# Patient Record
Sex: Female | Born: 1975 | Race: White | Hispanic: No | Marital: Single | Smoking: Never smoker
Health system: Southern US, Community
[De-identification: ages and names within clinical notes are randomized; demographics above are authoritative.]

## PROBLEM LIST (undated history)

## (undated) DIAGNOSIS — Q212 Atrioventricular septal defect, unspecified as to partial or complete: Secondary | ICD-10-CM

## (undated) DIAGNOSIS — Q2123 Complete atrioventricular septal defect: Secondary | ICD-10-CM

## (undated) DIAGNOSIS — Z952 Presence of prosthetic heart valve: Secondary | ICD-10-CM

## (undated) DIAGNOSIS — Z9889 Other specified postprocedural states: Secondary | ICD-10-CM

## (undated) DIAGNOSIS — I471 Supraventricular tachycardia: Secondary | ICD-10-CM

## (undated) DIAGNOSIS — E538 Deficiency of other specified B group vitamins: Secondary | ICD-10-CM

## (undated) DIAGNOSIS — IMO0002 Reserved for concepts with insufficient information to code with codable children: Secondary | ICD-10-CM

## (undated) DIAGNOSIS — F329 Major depressive disorder, single episode, unspecified: Secondary | ICD-10-CM

## (undated) DIAGNOSIS — F32A Depression, unspecified: Secondary | ICD-10-CM

## (undated) DIAGNOSIS — B1920 Unspecified viral hepatitis C without hepatic coma: Secondary | ICD-10-CM

## (undated) DIAGNOSIS — B977 Papillomavirus as the cause of diseases classified elsewhere: Secondary | ICD-10-CM

## (undated) DIAGNOSIS — C801 Malignant (primary) neoplasm, unspecified: Secondary | ICD-10-CM

## (undated) DIAGNOSIS — I509 Heart failure, unspecified: Secondary | ICD-10-CM

## (undated) HISTORY — PX: OTHER SURGICAL HISTORY: SHX169

## (undated) HISTORY — DX: Supraventricular tachycardia: I47.1

## (undated) HISTORY — DX: Deficiency of other specified B group vitamins: E53.8

## (undated) HISTORY — DX: Atrioventricular septal defect, unspecified as to partial or complete: Q21.20

## (undated) HISTORY — DX: Reserved for concepts with insufficient information to code with codable children: IMO0002

## (undated) HISTORY — DX: Depression, unspecified: F32.A

## (undated) HISTORY — DX: Atrioventricular septal defect: Q21.2

## (undated) HISTORY — DX: Unspecified viral hepatitis C without hepatic coma: B19.20

## (undated) HISTORY — DX: Heart failure, unspecified: I50.9

## (undated) HISTORY — DX: Complete atrioventricular septal defect: Q21.23

## (undated) HISTORY — PX: WRIST SURGERY: SHX841

## (undated) HISTORY — DX: Major depressive disorder, single episode, unspecified: F32.9

## (undated) HISTORY — PX: COLPOSCOPY: SHX161

## (undated) HISTORY — PX: STERNOTOMY: SHX1057

## (undated) HISTORY — PX: WISDOM TOOTH EXTRACTION: SHX21

## (undated) HISTORY — DX: Malignant (primary) neoplasm, unspecified: C80.1

## (undated) HISTORY — DX: Papillomavirus as the cause of diseases classified elsewhere: B97.7

---
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
## 1998-01-13 ENCOUNTER — Encounter (HOSPITAL_COMMUNITY): Admission: RE | Admit: 1998-01-13 | Discharge: 1998-04-13 | Payer: Self-pay | Admitting: Cardiology

## 1998-06-02 ENCOUNTER — Encounter (HOSPITAL_COMMUNITY): Admission: RE | Admit: 1998-06-02 | Discharge: 1998-08-31 | Payer: Self-pay | Admitting: Cardiology

## 1998-07-14 ENCOUNTER — Other Ambulatory Visit: Admission: RE | Admit: 1998-07-14 | Discharge: 1998-07-14 | Payer: Self-pay | Admitting: *Deleted

## 1999-08-19 ENCOUNTER — Other Ambulatory Visit: Admission: RE | Admit: 1999-08-19 | Discharge: 1999-08-19 | Payer: Self-pay | Admitting: *Deleted

## 2000-09-24 ENCOUNTER — Other Ambulatory Visit: Admission: RE | Admit: 2000-09-24 | Discharge: 2000-09-24 | Payer: Self-pay | Admitting: *Deleted

## 2001-09-25 ENCOUNTER — Other Ambulatory Visit: Admission: RE | Admit: 2001-09-25 | Discharge: 2001-09-25 | Payer: Self-pay | Admitting: *Deleted

## 2002-09-29 ENCOUNTER — Other Ambulatory Visit: Admission: RE | Admit: 2002-09-29 | Discharge: 2002-09-29 | Payer: Self-pay | Admitting: Obstetrics and Gynecology

## 2003-09-30 ENCOUNTER — Other Ambulatory Visit: Admission: RE | Admit: 2003-09-30 | Discharge: 2003-09-30 | Payer: Self-pay | Admitting: Obstetrics and Gynecology

## 2004-06-07 ENCOUNTER — Ambulatory Visit: Payer: Self-pay | Admitting: Internal Medicine

## 2004-06-14 ENCOUNTER — Ambulatory Visit: Payer: Self-pay | Admitting: Internal Medicine

## 2004-07-24 DIAGNOSIS — B977 Papillomavirus as the cause of diseases classified elsewhere: Secondary | ICD-10-CM

## 2004-07-24 HISTORY — DX: Papillomavirus as the cause of diseases classified elsewhere: B97.7

## 2004-08-09 ENCOUNTER — Ambulatory Visit: Payer: Self-pay | Admitting: Internal Medicine

## 2004-08-16 ENCOUNTER — Ambulatory Visit: Payer: Self-pay | Admitting: Internal Medicine

## 2004-08-31 ENCOUNTER — Ambulatory Visit: Payer: Self-pay | Admitting: Internal Medicine

## 2004-10-11 ENCOUNTER — Ambulatory Visit: Payer: Self-pay | Admitting: Internal Medicine

## 2004-10-18 ENCOUNTER — Ambulatory Visit: Payer: Self-pay | Admitting: Internal Medicine

## 2004-11-22 ENCOUNTER — Ambulatory Visit: Payer: Self-pay | Admitting: Internal Medicine

## 2005-09-27 ENCOUNTER — Ambulatory Visit: Payer: Self-pay | Admitting: Internal Medicine

## 2005-10-23 ENCOUNTER — Ambulatory Visit: Payer: Self-pay | Admitting: Internal Medicine

## 2005-11-20 ENCOUNTER — Ambulatory Visit: Payer: Self-pay | Admitting: Internal Medicine

## 2006-02-21 ENCOUNTER — Encounter: Admission: RE | Admit: 2006-02-21 | Discharge: 2006-02-21 | Payer: Self-pay | Admitting: Internal Medicine

## 2006-02-21 ENCOUNTER — Ambulatory Visit: Payer: Self-pay | Admitting: Internal Medicine

## 2006-03-05 ENCOUNTER — Ambulatory Visit: Payer: Self-pay | Admitting: Internal Medicine

## 2006-03-06 ENCOUNTER — Ambulatory Visit: Payer: Self-pay | Admitting: Internal Medicine

## 2006-03-06 ENCOUNTER — Encounter: Admission: RE | Admit: 2006-03-06 | Discharge: 2006-03-06 | Payer: Self-pay | Admitting: Orthopedic Surgery

## 2006-03-07 ENCOUNTER — Encounter: Admission: RE | Admit: 2006-03-07 | Discharge: 2006-03-07 | Payer: Self-pay | Admitting: Orthopedic Surgery

## 2006-03-28 ENCOUNTER — Encounter: Admission: RE | Admit: 2006-03-28 | Discharge: 2006-03-28 | Payer: Self-pay | Admitting: Orthopedic Surgery

## 2006-03-29 ENCOUNTER — Ambulatory Visit (HOSPITAL_BASED_OUTPATIENT_CLINIC_OR_DEPARTMENT_OTHER): Admission: RE | Admit: 2006-03-29 | Discharge: 2006-03-29 | Payer: Self-pay | Admitting: Orthopedic Surgery

## 2006-04-06 ENCOUNTER — Ambulatory Visit: Payer: Self-pay | Admitting: Internal Medicine

## 2006-06-28 ENCOUNTER — Ambulatory Visit: Payer: Self-pay | Admitting: Internal Medicine

## 2006-06-28 LAB — CONVERTED CEMR LAB
ALT: 26 units/L (ref 0–40)
Albumin: 4 g/dL (ref 3.5–5.2)
Alkaline Phosphatase: 53 units/L (ref 39–117)
Basophils Absolute: 0 10*3/uL (ref 0.0–0.1)
CO2: 28 meq/L (ref 19–32)
Chol/HDL Ratio, serum: 5.4
GFR calc non Af Amer: 90 mL/min
Glomerular Filtration Rate, Af Am: 109 mL/min/{1.73_m2}
Glucose, Bld: 77 mg/dL (ref 70–99)
LDL Cholesterol: 113 mg/dL — ABNORMAL HIGH (ref 0–99)
Lymphocytes Relative: 27.9 % (ref 12.0–46.0)
Monocytes Relative: 6.7 % (ref 3.0–11.0)
Platelets: 191 10*3/uL (ref 150–400)
Potassium: 3.6 meq/L (ref 3.5–5.1)
RBC: 4.49 M/uL (ref 3.87–5.11)
RDW: 12.7 % (ref 11.5–14.6)
Sodium: 137 meq/L (ref 135–145)
TSH: 1.49 microintl units/mL (ref 0.35–5.50)
Total Bilirubin: 1.4 mg/dL — ABNORMAL HIGH (ref 0.3–1.2)
Total Protein: 6.6 g/dL (ref 6.0–8.3)
VLDL: 20 mg/dL (ref 0–40)
WBC: 4.4 10*3/uL — ABNORMAL LOW (ref 4.5–10.5)

## 2006-07-18 ENCOUNTER — Ambulatory Visit (HOSPITAL_BASED_OUTPATIENT_CLINIC_OR_DEPARTMENT_OTHER): Admission: RE | Admit: 2006-07-18 | Discharge: 2006-07-18 | Payer: Self-pay | Admitting: Orthopedic Surgery

## 2006-07-19 ENCOUNTER — Ambulatory Visit: Payer: Self-pay | Admitting: Internal Medicine

## 2006-10-16 ENCOUNTER — Ambulatory Visit: Payer: Self-pay | Admitting: Internal Medicine

## 2006-10-22 ENCOUNTER — Ambulatory Visit: Payer: Self-pay | Admitting: Internal Medicine

## 2006-11-07 ENCOUNTER — Ambulatory Visit: Payer: Self-pay | Admitting: Internal Medicine

## 2006-11-07 LAB — CONVERTED CEMR LAB
ALT: 27 units/L (ref 0–40)
AST: 22 units/L (ref 0–37)
Albumin: 3.8 g/dL (ref 3.5–5.2)
Bilirubin, Direct: 0.2 mg/dL (ref 0.0–0.3)
HDL: 29.2 mg/dL — ABNORMAL LOW (ref >39.0)
Total Bilirubin: 1 mg/dL (ref 0.3–1.2)
VLDL: 30 mg/dL (ref 0–40)

## 2006-11-13 ENCOUNTER — Ambulatory Visit: Payer: Self-pay | Admitting: Internal Medicine

## 2007-01-02 ENCOUNTER — Ambulatory Visit: Payer: Self-pay | Admitting: Internal Medicine

## 2007-02-11 ENCOUNTER — Encounter: Payer: Self-pay | Admitting: Internal Medicine

## 2007-02-11 ENCOUNTER — Ambulatory Visit: Payer: Self-pay | Admitting: Internal Medicine

## 2007-02-11 DIAGNOSIS — F411 Generalized anxiety disorder: Secondary | ICD-10-CM | POA: Insufficient documentation

## 2007-02-11 DIAGNOSIS — Z952 Presence of prosthetic heart valve: Secondary | ICD-10-CM

## 2007-02-11 DIAGNOSIS — G43909 Migraine, unspecified, not intractable, without status migrainosus: Secondary | ICD-10-CM

## 2007-02-11 DIAGNOSIS — Z9889 Other specified postprocedural states: Secondary | ICD-10-CM

## 2007-02-15 ENCOUNTER — Telehealth: Payer: Self-pay | Admitting: *Deleted

## 2007-03-06 ENCOUNTER — Encounter: Payer: Self-pay | Admitting: Internal Medicine

## 2007-05-13 ENCOUNTER — Encounter: Payer: Self-pay | Admitting: Internal Medicine

## 2007-06-24 ENCOUNTER — Telehealth: Payer: Self-pay | Admitting: Internal Medicine

## 2007-12-05 ENCOUNTER — Telehealth (INDEPENDENT_AMBULATORY_CARE_PROVIDER_SITE_OTHER): Payer: Self-pay | Admitting: *Deleted

## 2008-03-02 ENCOUNTER — Telehealth: Payer: Self-pay | Admitting: Internal Medicine

## 2008-03-03 ENCOUNTER — Ambulatory Visit: Payer: Self-pay | Admitting: Internal Medicine

## 2008-03-09 ENCOUNTER — Encounter: Payer: Self-pay | Admitting: Internal Medicine

## 2008-03-09 ENCOUNTER — Encounter: Admission: RE | Admit: 2008-03-09 | Discharge: 2008-04-03 | Payer: Self-pay | Admitting: Neurosurgery

## 2008-04-03 ENCOUNTER — Encounter: Payer: Self-pay | Admitting: Internal Medicine

## 2008-05-18 ENCOUNTER — Ambulatory Visit: Payer: Self-pay | Admitting: Internal Medicine

## 2008-05-18 LAB — CONVERTED CEMR LAB
ALT: 24 units/L (ref 0–35)
AST: 22 units/L (ref 0–37)
Basophils Absolute: 0 10*3/uL (ref 0.0–0.1)
Basophils Relative: 0.6 % (ref 0.0–3.0)
Bilirubin Urine: NEGATIVE
Bilirubin, Direct: 0.3 mg/dL (ref 0.0–0.3)
CO2: 27 meq/L (ref 19–32)
Calcium: 8.6 mg/dL (ref 8.4–10.5)
Chloride: 109 meq/L (ref 96–112)
Creatinine, Ser: 0.8 mg/dL (ref 0.4–1.2)
Eosinophils Relative: 1.8 % (ref 0.0–5.0)
Glucose, Bld: 94 mg/dL (ref 70–99)
HCT: 39 % (ref 36.0–46.0)
HDL: 28.6 mg/dL — ABNORMAL LOW (ref >39.0)
Ketones, ur: NEGATIVE mg/dL
LDL Cholesterol: 98 mg/dL (ref 0–99)
Lymphocytes Relative: 22.9 % (ref 12.0–46.0)
MCHC: 34.2 g/dL (ref 30.0–36.0)
MCV: 93.7 fL (ref 78.0–100.0)
Monocytes Relative: 4.8 % (ref 3.0–12.0)
Mucus, UA: NEGATIVE
Neutro Abs: 3.7 10*3/uL (ref 1.4–7.7)
Neutrophils Relative %: 69.9 % (ref 43.0–77.0)
TSH: 1.31 microintl units/mL (ref 0.35–5.50)
Total Bilirubin: 1.1 mg/dL (ref 0.3–1.2)
Total CHOL/HDL Ratio: 5
Total Protein: 6.3 g/dL (ref 6.0–8.3)
Triglycerides: 88 mg/dL (ref 0–149)
Urobilinogen, UA: 0.2 (ref 0.0–1.0)

## 2008-05-25 ENCOUNTER — Ambulatory Visit: Payer: Self-pay | Admitting: Internal Medicine

## 2008-06-08 ENCOUNTER — Telehealth: Payer: Self-pay | Admitting: Internal Medicine

## 2008-06-08 ENCOUNTER — Ambulatory Visit: Payer: Self-pay | Admitting: Internal Medicine

## 2008-06-08 DIAGNOSIS — E785 Hyperlipidemia, unspecified: Secondary | ICD-10-CM

## 2008-06-22 ENCOUNTER — Ambulatory Visit: Payer: Self-pay | Admitting: Internal Medicine

## 2008-06-22 ENCOUNTER — Telehealth: Payer: Self-pay | Admitting: Internal Medicine

## 2008-06-23 ENCOUNTER — Telehealth: Payer: Self-pay | Admitting: *Deleted

## 2008-06-23 ENCOUNTER — Encounter: Admission: RE | Admit: 2008-06-23 | Discharge: 2008-06-23 | Payer: Self-pay | Admitting: Internal Medicine

## 2008-06-24 ENCOUNTER — Telehealth: Payer: Self-pay | Admitting: Internal Medicine

## 2008-06-25 ENCOUNTER — Telehealth: Payer: Self-pay | Admitting: Internal Medicine

## 2008-06-25 ENCOUNTER — Ambulatory Visit: Payer: Self-pay | Admitting: Internal Medicine

## 2008-06-25 LAB — CONVERTED CEMR LAB: INR: 6.6

## 2008-06-26 ENCOUNTER — Ambulatory Visit: Payer: Self-pay | Admitting: Internal Medicine

## 2008-06-26 ENCOUNTER — Telehealth: Payer: Self-pay | Admitting: Internal Medicine

## 2008-06-26 DIAGNOSIS — R740 Nonspecific elevation of levels of transaminase and lactic acid dehydrogenase [LDH]: Secondary | ICD-10-CM

## 2008-06-26 DIAGNOSIS — Q249 Congenital malformation of heart, unspecified: Secondary | ICD-10-CM

## 2008-06-29 ENCOUNTER — Ambulatory Visit: Payer: Self-pay | Admitting: Internal Medicine

## 2008-06-29 LAB — CONVERTED CEMR LAB: Prothrombin Time: 20.7 s

## 2008-06-30 ENCOUNTER — Encounter: Payer: Self-pay | Admitting: Internal Medicine

## 2008-06-30 LAB — CONVERTED CEMR LAB
ALT: 247 units/L — ABNORMAL HIGH (ref 0–35)
AST: 187 units/L — ABNORMAL HIGH (ref 0–37)
Albumin: 3.7 g/dL (ref 3.5–5.2)
Basophils Absolute: 0 10*3/uL (ref 0.0–0.1)
Calcium: 8.9 mg/dL (ref 8.4–10.5)
Chloride: 103 meq/L (ref 96–112)
Creatinine, Ser: 0.9 mg/dL (ref 0.4–1.2)
Eosinophils Absolute: 0.1 10*3/uL (ref 0.0–0.7)
Lymphocytes Relative: 18.5 % (ref 12.0–46.0)
MCHC: 33.7 g/dL (ref 30.0–36.0)
MCV: 97.6 fL (ref 78.0–100.0)
Neutrophils Relative %: 74.9 % (ref 43.0–77.0)
Platelets: 285 10*3/uL (ref 150–400)
Potassium: 4.8 meq/L (ref 3.5–5.1)
Prothrombin Time: 41.3 s — ABNORMAL HIGH (ref 10.9–13.3)
RDW: 17.1 % — ABNORMAL HIGH (ref 11.5–14.6)
Sodium: 137 meq/L (ref 135–145)
Total Protein: 6.6 g/dL (ref 6.0–8.3)

## 2008-07-01 ENCOUNTER — Encounter: Payer: Self-pay | Admitting: Internal Medicine

## 2008-07-06 ENCOUNTER — Encounter: Payer: Self-pay | Admitting: Internal Medicine

## 2008-07-10 ENCOUNTER — Telehealth: Payer: Self-pay | Admitting: Internal Medicine

## 2008-07-13 ENCOUNTER — Ambulatory Visit: Payer: Self-pay | Admitting: Internal Medicine

## 2008-07-13 LAB — CONVERTED CEMR LAB
INR: 5.3
Prothrombin Time: 28 s

## 2008-07-28 ENCOUNTER — Ambulatory Visit: Payer: Self-pay | Admitting: Internal Medicine

## 2008-07-28 LAB — CONVERTED CEMR LAB: Prothrombin Time: 22.2 s

## 2008-09-10 ENCOUNTER — Telehealth: Payer: Self-pay | Admitting: Internal Medicine

## 2008-09-17 ENCOUNTER — Ambulatory Visit: Payer: Self-pay | Admitting: Internal Medicine

## 2008-09-17 ENCOUNTER — Telehealth: Payer: Self-pay | Admitting: Internal Medicine

## 2008-09-24 ENCOUNTER — Telehealth: Payer: Self-pay | Admitting: Internal Medicine

## 2008-10-08 ENCOUNTER — Encounter: Payer: Self-pay | Admitting: Internal Medicine

## 2008-10-21 ENCOUNTER — Encounter: Payer: Self-pay | Admitting: Internal Medicine

## 2008-10-26 ENCOUNTER — Encounter: Payer: Self-pay | Admitting: Internal Medicine

## 2008-11-10 ENCOUNTER — Telehealth (INDEPENDENT_AMBULATORY_CARE_PROVIDER_SITE_OTHER): Payer: Self-pay | Admitting: *Deleted

## 2008-11-11 ENCOUNTER — Ambulatory Visit: Payer: Self-pay | Admitting: Internal Medicine

## 2008-11-11 LAB — CONVERTED CEMR LAB
Alkaline Phosphatase: 62 units/L (ref 39–117)
Glucose, Bld: 83 mg/dL (ref 70–99)
Pro B Natriuretic peptide (BNP): 93 pg/mL (ref 0.0–100.0)
Sodium: 141 meq/L (ref 135–145)
Total Bilirubin: 2.4 mg/dL — ABNORMAL HIGH (ref 0.3–1.2)
Total Protein: 7.4 g/dL (ref 6.0–8.3)

## 2008-11-17 ENCOUNTER — Telehealth: Payer: Self-pay | Admitting: Internal Medicine

## 2008-11-23 ENCOUNTER — Ambulatory Visit: Payer: Self-pay | Admitting: Internal Medicine

## 2008-11-27 ENCOUNTER — Encounter: Payer: Self-pay | Admitting: Internal Medicine

## 2009-01-08 ENCOUNTER — Ambulatory Visit: Payer: Self-pay | Admitting: Internal Medicine

## 2009-02-19 ENCOUNTER — Ambulatory Visit: Payer: Self-pay | Admitting: Internal Medicine

## 2009-02-19 LAB — CONVERTED CEMR LAB: Prothrombin Time: 18.7 s

## 2009-04-02 ENCOUNTER — Ambulatory Visit: Payer: Self-pay | Admitting: Family Medicine

## 2009-05-10 ENCOUNTER — Encounter: Payer: Self-pay | Admitting: Internal Medicine

## 2009-06-09 ENCOUNTER — Encounter (INDEPENDENT_AMBULATORY_CARE_PROVIDER_SITE_OTHER): Payer: Self-pay | Admitting: *Deleted

## 2009-06-16 ENCOUNTER — Encounter (INDEPENDENT_AMBULATORY_CARE_PROVIDER_SITE_OTHER): Payer: Self-pay | Admitting: *Deleted

## 2009-07-14 ENCOUNTER — Encounter: Payer: Self-pay | Admitting: Internal Medicine

## 2009-09-16 ENCOUNTER — Encounter: Payer: Self-pay | Admitting: Internal Medicine

## 2009-09-21 ENCOUNTER — Encounter: Payer: Self-pay | Admitting: Internal Medicine

## 2009-09-22 ENCOUNTER — Encounter: Payer: Self-pay | Admitting: Internal Medicine

## 2009-09-28 ENCOUNTER — Ambulatory Visit: Payer: Self-pay | Admitting: Internal Medicine

## 2009-09-28 LAB — CONVERTED CEMR LAB
INR: 1.5
Prothrombin Time: 15 s

## 2009-11-16 ENCOUNTER — Ambulatory Visit: Payer: Self-pay | Admitting: Internal Medicine

## 2009-11-24 ENCOUNTER — Telehealth: Payer: Self-pay | Admitting: Internal Medicine

## 2009-11-29 ENCOUNTER — Ambulatory Visit: Payer: Self-pay | Admitting: Internal Medicine

## 2009-11-29 DIAGNOSIS — D599 Acquired hemolytic anemia, unspecified: Secondary | ICD-10-CM | POA: Insufficient documentation

## 2009-11-30 ENCOUNTER — Telehealth: Payer: Self-pay | Admitting: *Deleted

## 2009-11-30 ENCOUNTER — Encounter: Payer: Self-pay | Admitting: Internal Medicine

## 2009-12-01 ENCOUNTER — Ambulatory Visit: Payer: Self-pay | Admitting: Internal Medicine

## 2009-12-01 LAB — CONVERTED CEMR LAB
ALT: 23 units/L (ref 0–35)
Alkaline Phosphatase: 54 units/L (ref 39–117)
Basophils Absolute: 0 10*3/uL (ref 0.0–0.1)
Basophils Relative: 0.5 % (ref 0.0–3.0)
Bilirubin, Direct: 0.4 mg/dL — ABNORMAL HIGH (ref 0.0–0.3)
Calcium: 9.2 mg/dL (ref 8.4–10.5)
Chloride: 99 meq/L (ref 96–112)
Creatinine, Ser: 0.7 mg/dL (ref 0.4–1.2)
Eosinophils Absolute: 0.1 10*3/uL (ref 0.0–0.7)
Eosinophils Relative: 1.3 % (ref 0.0–5.0)
Eosinophils Relative: 1.8 % (ref 0.0–5.0)
Folate: 12 ng/mL
INR: 2.8 — ABNORMAL HIGH (ref 0.8–1.0)
Iron: 73 ug/dL (ref 42–145)
LDL Cholesterol: 118 mg/dL — ABNORMAL HIGH (ref 0–99)
Lymphocytes Relative: 16.8 % (ref 12.0–46.0)
MCHC: 32.9 g/dL (ref 30.0–36.0)
MCV: 89.7 fL (ref 78.0–100.0)
Monocytes Absolute: 0.3 10*3/uL (ref 0.1–1.0)
Monocytes Relative: 5.6 % (ref 3.0–12.0)
Neutrophils Relative %: 75.8 % (ref 43.0–77.0)
Neutrophils Relative %: 73.2 % (ref 43.0–77.0)
Nitrite: NEGATIVE
Platelets: 254 10*3/uL (ref 150.0–400.0)
RBC: 3.39 M/uL — ABNORMAL LOW (ref 3.87–5.11)
RDW: 18.1 % — ABNORMAL HIGH (ref 11.5–14.6)
Total CHOL/HDL Ratio: 5
Total Protein, Urine: 30 mg/dL
Total Protein: 6.8 g/dL (ref 6.0–8.3)
Triglycerides: 117 mg/dL (ref 0.0–149.0)
Vitamin B-12: 212 pg/mL (ref 211–911)
WBC: 4.7 10*3/uL (ref 4.5–10.5)
WBC: 5.2 10*3/uL (ref 4.5–10.5)

## 2009-12-10 ENCOUNTER — Ambulatory Visit: Payer: Self-pay | Admitting: Internal Medicine

## 2009-12-10 LAB — CONVERTED CEMR LAB: INR: 2.1

## 2009-12-13 ENCOUNTER — Ambulatory Visit: Payer: Self-pay | Admitting: Internal Medicine

## 2009-12-13 ENCOUNTER — Telehealth: Payer: Self-pay | Admitting: Internal Medicine

## 2009-12-13 LAB — CONVERTED CEMR LAB
Alkaline Phosphatase: 43 units/L (ref 39–117)
Basophils Absolute: 0 10*3/uL (ref 0.0–0.1)
Basophils Relative: 0.3 % (ref 0.0–3.0)
Bilirubin, Direct: 0.3 mg/dL (ref 0.0–0.3)
Calcium: 8.7 mg/dL (ref 8.4–10.5)
Creatinine, Ser: 0.7 mg/dL (ref 0.4–1.2)
Eosinophils Absolute: 0.1 10*3/uL (ref 0.0–0.7)
GFR calc non Af Amer: 100.89 mL/min (ref >60)
HCT: 30 % — ABNORMAL LOW (ref 36.0–46.0)
Hemoglobin: 9.8 g/dL — ABNORMAL LOW (ref 12.0–15.0)
Lymphs Abs: 0.8 10*3/uL (ref 0.7–4.0)
MCHC: 32.7 g/dL (ref 30.0–36.0)
Monocytes Relative: 7 % (ref 3.0–12.0)
Neutro Abs: 3.1 10*3/uL (ref 1.4–7.7)
RBC: 3.43 M/uL — ABNORMAL LOW (ref 3.87–5.11)
RDW: 17.6 % — ABNORMAL HIGH (ref 11.5–14.6)
Sodium: 139 meq/L (ref 135–145)
Total Bilirubin: 1.1 mg/dL (ref 0.3–1.2)

## 2009-12-14 ENCOUNTER — Telehealth: Payer: Self-pay | Admitting: Internal Medicine

## 2009-12-14 ENCOUNTER — Encounter: Payer: Self-pay | Admitting: Internal Medicine

## 2009-12-15 ENCOUNTER — Telehealth: Payer: Self-pay | Admitting: Internal Medicine

## 2010-01-19 ENCOUNTER — Encounter: Payer: Self-pay | Admitting: Internal Medicine

## 2010-01-19 ENCOUNTER — Ambulatory Visit: Payer: Self-pay | Admitting: Internal Medicine

## 2010-01-19 ENCOUNTER — Telehealth: Payer: Self-pay | Admitting: Internal Medicine

## 2010-01-19 LAB — CONVERTED CEMR LAB
ALT: 28 units/L (ref 0–35)
AST: 41 units/L — ABNORMAL HIGH (ref 0–37)
Alkaline Phosphatase: 54 units/L (ref 39–117)
Basophils Absolute: 0 10*3/uL (ref 0.0–0.1)
Eosinophils Absolute: 0 10*3/uL (ref 0.0–0.7)
Eosinophils Relative: 1.1 % (ref 0.0–5.0)
HCT: 29.2 % — ABNORMAL LOW (ref 36.0–46.0)
INR: 2.4
LDH: 633 units/L — ABNORMAL HIGH (ref 94–250)
Lymphs Abs: 0.7 10*3/uL (ref 0.7–4.0)
MCV: 89.1 fL (ref 78.0–100.0)
Monocytes Absolute: 0.3 10*3/uL (ref 0.1–1.0)
Neutrophils Relative %: 74.1 % (ref 43.0–77.0)
Platelets: 247 10*3/uL (ref 150.0–400.0)
RDW: 18.7 % — ABNORMAL HIGH (ref 11.5–14.6)
Total Bilirubin: 0.8 mg/dL (ref 0.3–1.2)
WBC: 4.1 10*3/uL — ABNORMAL LOW (ref 4.5–10.5)

## 2010-01-31 ENCOUNTER — Telehealth: Payer: Self-pay | Admitting: *Deleted

## 2010-02-09 ENCOUNTER — Telehealth: Payer: Self-pay | Admitting: Internal Medicine

## 2010-02-16 ENCOUNTER — Telehealth: Payer: Self-pay | Admitting: Internal Medicine

## 2010-03-04 ENCOUNTER — Ambulatory Visit: Payer: Self-pay | Admitting: Internal Medicine

## 2010-03-07 ENCOUNTER — Ambulatory Visit: Payer: Self-pay | Admitting: Internal Medicine

## 2010-03-07 ENCOUNTER — Telehealth: Payer: Self-pay | Admitting: Internal Medicine

## 2010-03-07 LAB — CONVERTED CEMR LAB
Alkaline Phosphatase: 51 units/L (ref 39–117)
BUN: 14 mg/dL (ref 6–23)
Basophils Absolute: 0 10*3/uL (ref 0.0–0.1)
Bilirubin, Direct: 0.3 mg/dL (ref 0.0–0.3)
CO2: 30 meq/L (ref 19–32)
Chloride: 100 meq/L (ref 96–112)
Creatinine, Ser: 0.7 mg/dL (ref 0.4–1.2)
Eosinophils Absolute: 0.1 10*3/uL (ref 0.0–0.7)
GFR calc non Af Amer: 105.89 mL/min (ref >60)
INR: 3.7
Iron: 85 ug/dL (ref 42–145)
Lymphocytes Relative: 20.2 % (ref 12.0–46.0)
MCHC: 33.5 g/dL (ref 30.0–36.0)
Monocytes Relative: 5.9 % (ref 3.0–12.0)
Neutro Abs: 2.9 10*3/uL (ref 1.4–7.7)
Neutrophils Relative %: 71.5 % (ref 43.0–77.0)
Platelets: 171 10*3/uL (ref 150.0–400.0)
Potassium: 3.9 meq/L (ref 3.5–5.1)
RDW: 22.6 % — ABNORMAL HIGH (ref 11.5–14.6)
Total Bilirubin: 1.4 mg/dL — ABNORMAL HIGH (ref 0.3–1.2)
Total Protein: 6.3 g/dL (ref 6.0–8.3)

## 2010-03-09 ENCOUNTER — Ambulatory Visit: Payer: Self-pay | Admitting: Internal Medicine

## 2010-03-09 DIAGNOSIS — E538 Deficiency of other specified B group vitamins: Secondary | ICD-10-CM

## 2010-03-09 HISTORY — DX: Deficiency of other specified B group vitamins: E53.8

## 2010-03-22 ENCOUNTER — Ambulatory Visit: Payer: Self-pay | Admitting: Cardiovascular Disease

## 2010-03-22 ENCOUNTER — Telehealth: Payer: Self-pay | Admitting: Internal Medicine

## 2010-03-24 ENCOUNTER — Telehealth: Payer: Self-pay | Admitting: Internal Medicine

## 2010-03-29 ENCOUNTER — Encounter: Payer: Self-pay | Admitting: Internal Medicine

## 2010-04-01 ENCOUNTER — Telehealth: Payer: Self-pay | Admitting: Internal Medicine

## 2010-04-04 ENCOUNTER — Encounter: Payer: Self-pay | Admitting: Internal Medicine

## 2010-04-04 ENCOUNTER — Telehealth (INDEPENDENT_AMBULATORY_CARE_PROVIDER_SITE_OTHER): Payer: Self-pay | Admitting: *Deleted

## 2010-04-06 ENCOUNTER — Ambulatory Visit: Payer: Self-pay | Admitting: Internal Medicine

## 2010-04-18 ENCOUNTER — Ambulatory Visit: Payer: Self-pay | Admitting: Internal Medicine

## 2010-04-19 LAB — CONVERTED CEMR LAB
ALT: 23 units/L (ref 0–35)
AST: 43 units/L — ABNORMAL HIGH (ref 0–37)
Alkaline Phosphatase: 44 units/L (ref 39–117)
Basophils Relative: 0.5 % (ref 0.0–3.0)
Bilirubin, Direct: 0.4 mg/dL — ABNORMAL HIGH (ref 0.0–0.3)
CO2: 34 meq/L — ABNORMAL HIGH (ref 19–32)
Chloride: 98 meq/L (ref 96–112)
Eosinophils Relative: 1.2 % (ref 0.0–5.0)
Glucose, Bld: 81 mg/dL (ref 70–99)
Hemoglobin: 13.2 g/dL (ref 12.0–15.0)
INR: 2.2 — ABNORMAL HIGH (ref 0.8–1.0)
Lymphocytes Relative: 20.9 % (ref 12.0–46.0)
MCV: 94.4 fL (ref 78.0–100.0)
Neutro Abs: 4.5 10*3/uL (ref 1.4–7.7)
Neutrophils Relative %: 73 % (ref 43.0–77.0)
RBC: 4.09 M/uL (ref 3.87–5.11)
Sodium: 139 meq/L (ref 135–145)
Total Protein: 7.1 g/dL (ref 6.0–8.3)
WBC: 6.1 10*3/uL (ref 4.5–10.5)

## 2010-04-22 ENCOUNTER — Encounter: Payer: Self-pay | Admitting: Internal Medicine

## 2010-04-25 ENCOUNTER — Telehealth: Payer: Self-pay | Admitting: Internal Medicine

## 2010-04-26 ENCOUNTER — Encounter: Payer: Self-pay | Admitting: Internal Medicine

## 2010-04-26 ENCOUNTER — Ambulatory Visit: Payer: Self-pay

## 2010-04-26 ENCOUNTER — Ambulatory Visit: Payer: Self-pay | Admitting: Cardiology

## 2010-04-26 ENCOUNTER — Ambulatory Visit (HOSPITAL_COMMUNITY): Admission: RE | Admit: 2010-04-26 | Discharge: 2010-04-26 | Payer: Self-pay | Admitting: Internal Medicine

## 2010-04-27 ENCOUNTER — Telehealth: Payer: Self-pay | Admitting: Internal Medicine

## 2010-05-02 ENCOUNTER — Encounter: Payer: Self-pay | Admitting: Internal Medicine

## 2010-05-02 ENCOUNTER — Telehealth: Payer: Self-pay | Admitting: Internal Medicine

## 2010-05-04 ENCOUNTER — Ambulatory Visit: Payer: Self-pay | Admitting: Internal Medicine

## 2010-05-04 LAB — CONVERTED CEMR LAB
ALT: 28 units/L (ref 0–35)
AST: 39 units/L — ABNORMAL HIGH (ref 0–37)
Alkaline Phosphatase: 53 units/L (ref 39–117)
Bilirubin, Direct: 0.3 mg/dL (ref 0.0–0.3)
Total Protein: 7.2 g/dL (ref 6.0–8.3)

## 2010-05-16 ENCOUNTER — Encounter: Payer: Self-pay | Admitting: Internal Medicine

## 2010-05-27 ENCOUNTER — Telehealth (INDEPENDENT_AMBULATORY_CARE_PROVIDER_SITE_OTHER): Payer: Self-pay | Admitting: *Deleted

## 2010-05-27 ENCOUNTER — Ambulatory Visit: Payer: Self-pay | Admitting: Internal Medicine

## 2010-05-30 LAB — CONVERTED CEMR LAB
BUN: 16 mg/dL (ref 6–23)
Calcium: 9.5 mg/dL (ref 8.4–10.5)
Chloride: 97 meq/L (ref 96–112)
Creatinine, Ser: 0.7 mg/dL (ref 0.4–1.2)
GFR calc non Af Amer: 109.45 mL/min (ref >60)
Pro B Natriuretic peptide (BNP): 153 pg/mL — ABNORMAL HIGH (ref 0.0–100.0)

## 2010-07-06 ENCOUNTER — Ambulatory Visit: Payer: Self-pay | Admitting: Internal Medicine

## 2010-07-11 ENCOUNTER — Ambulatory Visit: Payer: Self-pay | Admitting: Internal Medicine

## 2010-07-11 LAB — CONVERTED CEMR LAB: INR: 4.3

## 2010-07-19 ENCOUNTER — Ambulatory Visit
Admission: RE | Admit: 2010-07-19 | Discharge: 2010-07-19 | Payer: Self-pay | Source: Home / Self Care | Attending: Internal Medicine | Admitting: Internal Medicine

## 2010-07-19 LAB — CONVERTED CEMR LAB: INR: 1.5

## 2010-07-20 ENCOUNTER — Ambulatory Visit: Payer: Self-pay | Admitting: Internal Medicine

## 2010-07-27 ENCOUNTER — Ambulatory Visit
Admission: RE | Admit: 2010-07-27 | Discharge: 2010-07-27 | Payer: Self-pay | Source: Home / Self Care | Attending: Internal Medicine | Admitting: Internal Medicine

## 2010-07-27 LAB — CONVERTED CEMR LAB: INR: 2.1

## 2010-08-01 ENCOUNTER — Ambulatory Visit
Admission: RE | Admit: 2010-08-01 | Discharge: 2010-08-01 | Payer: Self-pay | Source: Home / Self Care | Attending: Internal Medicine | Admitting: Internal Medicine

## 2010-08-01 LAB — CONVERTED CEMR LAB: INR: 2.3

## 2010-08-05 ENCOUNTER — Telehealth: Payer: Self-pay | Admitting: Internal Medicine

## 2010-08-11 ENCOUNTER — Encounter: Payer: Self-pay | Admitting: Internal Medicine

## 2010-08-11 ENCOUNTER — Ambulatory Visit
Admission: RE | Admit: 2010-08-11 | Discharge: 2010-08-11 | Payer: Self-pay | Source: Home / Self Care | Attending: Internal Medicine | Admitting: Internal Medicine

## 2010-08-11 ENCOUNTER — Other Ambulatory Visit: Payer: Self-pay | Admitting: Internal Medicine

## 2010-08-11 LAB — BRAIN NATRIURETIC PEPTIDE: Pro B Natriuretic peptide (BNP): 109.8 pg/mL — ABNORMAL HIGH (ref 0.0–100.0)

## 2010-08-12 ENCOUNTER — Telehealth: Payer: Self-pay | Admitting: Internal Medicine

## 2010-08-14 ENCOUNTER — Encounter: Payer: Self-pay | Admitting: Orthopedic Surgery

## 2010-08-14 ENCOUNTER — Encounter: Payer: Self-pay | Admitting: Internal Medicine

## 2010-08-21 LAB — CONVERTED CEMR LAB
HCV Ab: REACTIVE — AB
Hep A IgM: NEGATIVE
Hepatitis B Surface Ag: NEGATIVE

## 2010-08-23 NOTE — Progress Notes (Signed)
Summary: labs.  Phone Note Call from Patient   Caller: Patient Call For: Stacie Glaze MD Reason for Call: Talk to Doctor Summary of Call: Pt is requesting to have labs drawn at the hospital before her office visit today. Per Dr. Lovell Sheehan: LFT CBC BMET Stool Studies. Initial call taken by: Lynann Beaver CMA,  Dec 13, 2009 9:32 AM  Follow-up for Phone Call        labs in computer- Follow-up by: Willy Eddy, LPN,  Dec 13, 2009 9:39 AM

## 2010-08-23 NOTE — Assessment & Plan Note (Signed)
Summary: 2 MTH ROV  (PT TO LAB FOR PT) // RS---PT [facility] // RS   Vital Signs:  Patient profile:   35 year old female Height:      64 inches Weight:      135.8 pounds BMI:     23.39 Temp:     98.2 degrees F oral Pulse rate:   56 / minute Resp:     14 per minute BP sitting:   110 / 74  (left arm) BP standing:   110 / 74  (left arm)  Vitals Entered By: Willy Eddy, LPN (May 04, 2010 10:13 AM) CC: ROA- BISOPROLOL INCREASED TO 7.5 QD, Lipid Management Is Patient Diabetic? No   Primary Care Provider:  Darryll Capers, MD  CC:  ROA- BISOPROLOL INCREASED TO 7.5 QD and Lipid Management.  History of Present Illness: The pt presents for follow up of the bisoprolol monitering of LDH and liver functions generally feels well and her episodic weakness may be due to PAT?   Lipid Management History:      Positive NCEP/ATP III risk factors include HDL cholesterol less than 40.  Negative NCEP/ATP III risk factors include female age less than 89 years old, non-tobacco-user status, non-hypertensive, no ASHD (atherosclerotic heart disease), no prior stroke/TIA, no peripheral vascular disease, and no history of aortic aneurysm.     Preventive Screening-Counseling & Management  Alcohol-Tobacco     Smoking Status: never     Tobacco Counseling: not indicated; no tobacco use  Problems Prior to Update: 1)  Svt/ Psvt/ Pat  (ICD-427.0) 2)  B12 Deficiency  (ICD-266.2) 3)  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) 4)  Acute Cystitis  (ICD-595.0) 5)  Acquired Hemolytic Anemia Unspecified  (ICD-283.9) 6)  Alopecia  (ICD-704.00) 7)  Headache, Chronic  (ICD-784.0) 8)  Other Acute Sinusitis  (ICD-461.8) 9)  Encounter For Therapeutic Drug Monitoring  (ICD-V58.83) 10)  Encounter For Therapeutic Drug Monitoring  (ICD-V58.83) 11)  Congenital Heart Disease, Hx of  (ICD-V12.59) 12)  Diarrhea  (ICD-787.91) 13)  Dyspnea On Exertion  (ICD-786.09) 14)  Abnormal Transaminase-lft's  (ICD-790.4) 15)   Other Ascites  (ICD-789.59) 16)  Nonspecific Abn Findng Rad&oth Exam Bilary Trct  (ICD-793.3) 17)  Encounter For Therapeutic Drug Monitoring  (ICD-V58.83) 18)  Abdominal Pain, Generalized  (ICD-789.07) 19)  Hyperlipidemia, Borderline, 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) 20)  Physical Examination  (ICD-V70.0) 21)  Back Pain, Lumbar, With Radiculopathy  (ICD-724.4) 22)  Anxiety State Nos  (ICD-300.00) 23)  Migraine Nos W/intractable Migraine  (ICD-346.91) 24)  Coumadin Therapy  (ICD-V58.61) 25)  Mitral Valve Replacement, Hx of  (ICD-V15.1) 26)  Aortic Valve Replacement, Hx of  (ICD-V43.3)  Current Problems (verified): 1)  Svt/ Psvt/ Pat  (ICD-427.0) 2)  B12 Deficiency  (ICD-266.2) 3)  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) 4)  Acute Cystitis  (ICD-595.0) 5)  Acquired Hemolytic Anemia Unspecified  (ICD-283.9) 6)  Alopecia  (ICD-704.00) 7)  Headache, Chronic  (ICD-784.0) 8)  Other Acute Sinusitis  (ICD-461.8) 9)  Encounter For Therapeutic Drug Monitoring  (ICD-V58.83) 10)  Encounter For Therapeutic Drug Monitoring  (ICD-V58.83) 11)  Congenital Heart Disease, Hx of  (ICD-V12.59) 12)  Diarrhea  (ICD-787.91) 13)  Dyspnea On Exertion  (ICD-786.09) 14)  Abnormal Transaminase-lft's  (ICD-790.4) 15)  Other Ascites  (ICD-789.59) 16)  Nonspecific Abn Findng Rad&oth Exam Bilary Trct  (ICD-793.3) 17)  Encounter For Therapeutic Drug Monitoring  (ICD-V58.83) 18)  Abdominal Pain, Generalized  (ICD-789.07) 19)  Hyperlipidemia, Borderline, 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) 20)  Physical Examination  (ICD-V70.0) 21)  Back Pain, Lumbar, With Radiculopathy  (ICD-724.4) 22)  Anxiety State Nos  (ICD-300.00) 23)  Migraine Nos W/intractable Migraine  (ICD-346.91) 24)  Coumadin Therapy  (ICD-V58.61) 25)  Mitral Valve Replacement, Hx of  (ICD-V15.1) 26)  Aortic Valve Replacement, Hx of  (ICD-V43.3)  Medications Prior to Update: 1)  Coumadin 4 Mg Tabs (Warfarin Sodium) .... Take 4 Mg By Mouth As Directed 2)   Maxalt-Mlt 10 Mg Tbdp (Rizatriptan Benzoate) .... Take 1 Tablet By Mouth 3)  Ortho-Novum 1/35 (28) 1-35 Mg-Mcg Tabs (Norethindrone-Eth Estradiol) .... Once Daily 4)  Spironolactone 50 Mg Tabs (Spironolactone) .Marland Kitchen.. 1 Time Every Other Day 5)  Cymbalta 30 Mg Cpep (Duloxetine Hcl) .Marland Kitchen.. 1 Once Daily 6)  Hydrochlorothiazide 50 Mg Tabs (Hydrochlorothiazide) .Marland Kitchen.. 1 Every Other Day 7)  Furosemide 40 Mg Tabs (Furosemide) .Marland Kitchen.. 1 Once Daily 8)  Bisoprolol Fumarate 5 Mg Tabs (Bisoprolol Fumarate) .... One By Mouth Daily 9)  Ferrous Sulfate 325 (65 Fe) Mg Tabs (Ferrous Sulfate) .... (Otc) 1 Tab Once Daily 10)  Multi Vitamin .... Take One Tablet By Mouth Daily  Current Medications (verified): 1)  Coumadin 4 Mg Tabs (Warfarin Sodium) .... Take 4 Mg By Mouth As Directed 2)  Maxalt-Mlt 10 Mg Tbdp (Rizatriptan Benzoate) .... Take 1 Tablet By Mouth 3)  Ortho-Novum 1/35 (28) 1-35 Mg-Mcg Tabs (Norethindrone-Eth Estradiol) .... Once Daily 4)  Spironolactone 50 Mg Tabs (Spironolactone) .Marland Kitchen.. 1 Time Every Other Day 5)  Cymbalta 30 Mg Cpep (Duloxetine Hcl) .Marland Kitchen.. 1 Once Daily 6)  Hydrochlorothiazide 50 Mg Tabs (Hydrochlorothiazide) .Marland Kitchen.. 1 Every Other Day 7)  Furosemide 40 Mg Tabs (Furosemide) .Marland Kitchen.. 1 Once Daily 8)  Bisoprolol Fumarate 5 Mg Tabs (Bisoprolol Fumarate) .... 1.5 Once Daily 9)  Ferrous Sulfate 325 (65 Fe) Mg Tabs (Ferrous Sulfate) .... (Otc) 1 Tab Once Daily 10)  Multi Vitamin .... Take One Tablet By Mouth Daily  Allergies (verified): No Known Drug Allergies  Past History:  Family History: Last updated: 06/26/2008 Family History of Colon Cancer: Paternal Grandmother Family History of Colon Polyps: Father  Social History: Last updated: 06/26/2008 Single Never Smoked Alcohol Use - yes - occasional Illicit Drug Use - no Patient gets regular exercise. Occupation: Engineer, civil (consulting), [employer]  Risk Factors: Exercise: yes (06/26/2008)  Risk Factors: Smoking Status: never  (05/04/2010)  Past medical, surgical, family and social histories (including risk factors) reviewed, and no changes noted (except as noted below).  Past Medical History: Reviewed history from 03/04/2010 and no changes required. 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 Family History of Colon Cancer: Paternal Grandmother Family History of Colon Polyps: Father  Social History: Reviewed history from 06/26/2008 and no changes required. Single Never Smoked Alcohol Use - yes - occasional Illicit Drug Use - no Patient gets regular exercise. Occupation: Engineer, civil (consulting), [employer]  Review of Systems  The patient denies anorexia, fever, weight loss, weight gain, vision loss, decreased hearing, hoarseness, chest pain, syncope, dyspnea on exertion, peripheral edema, prolonged cough, headaches, hemoptysis, abdominal pain, melena, hematochezia, severe indigestion/heartburn, hematuria, incontinence, genital sores, muscle weakness, suspicious skin lesions, transient blindness, difficulty  walking, depression, unusual weight change, abnormal bleeding, enlarged lymph nodes, angioedema, and breast masses.    Physical Exam  General:  much improved color well-developed, well-nourished, and well-hydrated.   Head:  normocephalic and atraumatic.   Eyes:  pupils equal and pupils round.   Ears:  R ear normal and L ear normal.   Nose:  no external deformity and no nasal discharge.   Mouth:  good dentition.    Lungs:  normal respiratory effort and no wheezes.   Heart:  aortic and mitral mechanical valve sounds no evidecne of value leakage murmurs ( no thrills) Abdomen:  soft and normal bowel sounds.   Msk:  No deformity or scoliosis noted of thoracic or lumbar spine.   Extremities:  No clubbing, cyanosis, edema, or deformity noted with normal full range of motion of all joints.   Neurologic:  alert & oriented X3, gait normal, and DTRs symmetrical and normal.     Impression & Recommendations:  Problem # 1:  SVT/ PSVT/ PAT (ICD-427.0)  the telemetry report showes some episodic af She is no orthostatic and is in SR today the bisoprolol was increased   Her updated medication list for this problem includes:    Coumadin 4 Mg Tabs (Warfarin sodium) .Marland Kitchen... Take 4 mg by mouth as directed    Bisoprolol Fumarate 5 Mg Tabs (Bisoprolol fumarate) .Marland Kitchen... 1.5 once daily  Labs Reviewed: Na: 139 (04/18/2010)   K+: 3.8 (04/18/2010)   CL: 98 (04/18/2010)   HCO3: 34 (04/18/2010) Ca: 9.2 (04/18/2010)   TSH: 1.80 (11/16/2009)   HCO3: 34 (04/18/2010)  Problem # 2:  ABNORMAL TRANSAMINASE-LFT'S (ICD-790.4)  monitering  Orders: Fingerstick ([address]) TLB-Hepatic/Liver Function Pnl (80076-HEPATIC)  Problem # 3:  ACQUIRED HEMOLYTIC ANEMIA UNSPECIFIED (ICD-283.9) Assessment: Unchanged  due to valular dz Her updated medication list for this problem includes:    Ferrous Sulfate 325 (65 Fe) Mg Tabs (Ferrous sulfate) ..... (otc) 1 tab once daily  Hgb: 13.2 (04/18/2010)   Hct: 38.6 (04/18/2010)   Platelets: 217.0 (04/18/2010) RBC: 4.09 (04/18/2010)   RDW: 16.7 (04/18/2010)   WBC: 6.1 (04/18/2010) MCV: 94.4 (04/18/2010)   MCHC: 34.2 (04/18/2010) Iron: 85 (03/07/2010)   % Sat: 12.4 (11/29/2009) B12: 353 (03/07/2010)   Folate: 12.0 (11/29/2009)   TSH: 1.80 (11/16/2009)  Orders: T-LDH Isoenzyme ([id number])  Problem # 4:  COUMADIN THERAPY (ICD-V58.61) protime was 2 weeks ago 2.2  ( 2.5 to 3.5)  has an  appointment in 2 weeks  Complete Medication List: 1)  Coumadin 4 Mg Tabs (Warfarin sodium) .... Take 4 mg by mouth as directed 2)  Maxalt-mlt 10 Mg Tbdp (Rizatriptan benzoate) .... Take 1 tablet by mouth 3)  Ortho-novum 1/35 (28) 1-35 Mg-mcg Tabs (Norethindrone-eth estradiol) .... Once daily 4)  Spironolactone 50 Mg Tabs (Spironolactone) .Marland Kitchen.. 1 time every other day 5)  Cymbalta 30 Mg Cpep (Duloxetine hcl) .Marland Kitchen.. 1 once daily 6)  Hydrochlorothiazide 50 Mg Tabs (Hydrochlorothiazide) .Marland Kitchen.. 1 every other day 7)  Furosemide 40 Mg Tabs (Furosemide) .Marland Kitchen.. 1 once daily 8)  Bisoprolol Fumarate 5 Mg Tabs (Bisoprolol fumarate) .... 1.5 once daily 9)  Ferrous Sulfate 325 (65 Fe) Mg Tabs (Ferrous sulfate) .... (otc) 1 tab once daily 10)  Multi Vitamin  .... Take one tablet by mouth daily  Lipid Assessment/Plan:      Based on NCEP/ATP III, the patient's risk factor category is "0-1 risk factors".  The patient's lipid goals are as follows: Total cholesterol goal is 200; LDL cholesterol goal is 160; HDL cholesterol goal  is 40; Triglyceride goal is 150.    Patient Instructions: 1)  Please schedule a follow-up appointment in 3 months.  Appended Document: Orders Update    Clinical Lists Changes  Orders: Added new Service order of Venipuncture ([address]) - Signed Added new Service order of Specimen Handling (60454) - Signed

## 2010-08-23 NOTE — Letter (Signed)
Summary: Generic Letter  Architectural technologist, Main Office  [address]   [place], [place] [address]   Phone: [phone]  Fax: [phone]                   05/02/2010       Wynn Maudlin, M.D. [facility] [place], [place] [address] [place], [place] [address]  Dear Renae Fickle,    Enclosed are strips of event recordings from the monitor that Katryna has worn over the past few weeks.  There are several short bursts of SVT, suspicious for aflutter except that they are so short in duration.  Yanira has continued to complain of fatigue, feeling her heart race.  I have asked her to increase her bisoprolol from 5 mg per day to 5 mg AM / 2.5 mg PM.    Timera also had an echo done in our office last week.  A CD is enclosed for your review.  Her PAP was increased from that reported previously.  Otherwise, there did not appear to be significant change from the previous report.  OF note, during the study Kenady did have several short bursts of tachycardia.  She did not, however, sense these as they were occurring.      I will keep up with the monitor and send you more strips if there are any more with significant arrhythmia.  You are due to see Cintya at the end of this month.                Sincerely,

## 2010-08-23 NOTE — Letter (Signed)
Summary: Cardiac Re-Evaluation/[facility]  Cardiac Re-Evaluation/[facility]   Imported By: Maryln Gottron 05/19/2010 14:53:54  _____________________________________________________________________  External Attachment:    Type:   Image     Comment:   External Document

## 2010-08-23 NOTE — Letter (Signed)
Summary: [facility]  [facility]   Imported By: Maryln Gottron 10/01/2009 14:05:29  _____________________________________________________________________  External Attachment:    Type:   Image     Comment:   External Document

## 2010-08-23 NOTE — Progress Notes (Signed)
  Walk in Patient Form Recieved " note left for Nurse" sent to [facility]  May 27, 2010 1:19 PM

## 2010-08-23 NOTE — Assessment & Plan Note (Signed)
Summary: increased fatigue/sob   Visit Type:  Follow-up Primary Jessica [name]:  Jessica Capers, MD  CC:  sob and fatigue.  History of Present Illness: She has a history of AV canal defect, s/p repair early in life.  Review of her records shows she underwent MV replacement initially with heterograft and then 23 mm St Jude mechanical valve.   In May 1991 she underwent modified Konno operation with a 25 mm St Jude valve in the aortic position. In 1995 she had a 23 mm Carbomedics valve placed in the mitral position for a partially obstructed valve.  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  All surgeries were done at [facility]. 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  There was trivial AI.  LV and RV function were normal  RV mildly dilated  Atria were mildly dilated..  No fistula was seen.  Pulmonary pressures were mildly increased.  RV pressures normal. Patient has had problems with tachypalpitations.  She was placed on a b blocker (bisoprolol) for these. When  I saw her last she complained of noticing some increassed heart rates esp when whe was climbing stairs.  Otherwise she says she was doing ok.  Holter monitor was done that showed no signif arrhtyhmia.  she was also set up and has begun using an event monitor. SInce her last clinic visit she has had more episodes of increased heart rates.  She is using the monitor now.  She also says she is feeling more fatigued.  She finds herself at work feeling tired which is new for her.  She is no longer working out.  Current Medications (verified): 1)  Coumadin 4 Mg Tabs (Warfarin Sodium) .... Take 4 Mg By Mouth As Directed 2)  Maxalt-Mlt 10 Mg Tbdp (Rizatriptan Benzoate) .... Take 1 Tablet By Mouth 3)  Ortho-Novum 1/35 (28) 1-35 Mg-Mcg Tabs (Norethindrone-Eth Estradiol) .... Once Daily 4)  Spironolactone 50 Mg Tabs (Spironolactone) .Marland Kitchen.. 1 Time Every Other  Day 5)  Cymbalta 30 Mg Cpep (Duloxetine Hcl) .Marland Kitchen.. 1 Once Daily 6)  Hydrochlorothiazide 50 Mg Tabs (Hydrochlorothiazide) .Marland Kitchen.. 1 Every Other Day 7)  Furosemide 40 Mg Tabs (Furosemide) .Marland Kitchen.. 1 Once Daily 8)  Bisoprolol Fumarate 5 Mg Tabs (Bisoprolol Fumarate) .... One By Mouth Daily 9)  Ferrous Sulfate 325 (65 Fe) Mg Tabs (Ferrous Sulfate) .... (Otc) 1 Tab Once Daily 10)  Multi Vitamin .... Take One Tablet By Mouth Daily  Allergies (verified): No Known Drug Allergies  Past History:  Past medical, surgical, family and social histories (including risk factors) reviewed, and no changes noted (except as noted below).  Past Medical History: Reviewed history from 03/04/2010 and no changes required. 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 Family History of Colon Cancer: Paternal Grandmother Family History of Colon Polyps: Father  Social History: Reviewed history from 06/26/2008 and no changes required. Single Never Smoked Alcohol Use - yes - occasional Illicit Drug Use - no Patient gets regular exercise. Occupation: Engineer, civil (consulting), Psychologist, clinical  Vital Signs:  Patient profile:   35 year old female Height:      64 inches Weight:      135 pounds BMI:     23.26 Pulse rate:   62 / minute BP sitting:   102 / 72  (left arm) Cuff size:   regular  Vitals Entered By: Burnett Kanaris, CNA (April 18, 2010 9:03 AM)  Physical Exam  Additional Exam:  General:  Well developed, well nourished, in  no acute distress. Head:  normocephalic and atraumatic Eyes:  PERRLA/EOM intact; conjunctiva and lids normal. Mouth:  Mucous membranes moist Neck:  JVP is normal  NO thyromegaly. Lungs:  CTA.  NO rales or wheezes. Heart:  RRR.  S!, S2.  Crisp valve sounds.  Gr II/VI systolic murmur LLSB.  Gr 2-3/VI diastolic murmur LUSB.  No S3.  PMI not displaced. Abdomen:  Supple  No hepatomegaly.  Normal bowel sounds.   Pulses:  2+ pulses, equal onset throughout. Extremities:  No lower extremity edema.       Impression & Recommendations:  Problem # 1:  CONGENITAL HEART DISEASE, HX OF (ICD-V12.59) Patient with recent increase in fatigue.  COntinues to note higher heart rates. On exam, volume status looks good.  PI murmur does seem a little more prominent. On review of the initial monitor results there is one episode of fast heart rates about 140 bpm.  Question if ST vs atrial flutter.  I have asked that [employer] send in more strips (pre/post) from this event.  Jessica [name] reports that the device autotriggered the recordings on this day. I have encouraged Jessica [name] to cntinue to activate the device to catch more episodes. With her symptoms I would also recommend checking CBC, INR, LDH, electrolytes. I would also sched her for an echo to reeval valves, ventricular function.   Note that she is due to be seen in [place] at the end of October.  Other Orders: T- * Misc. Laboratory test [phone]) Echocardiogram (Echo) TLB-CBC Platelet - w/Differential (85025-CBCD) TLB-PT (Protime) (85610-PTP) TLB-BMP (Basic Metabolic Panel-BMET) (80048-METABOL) TLB-Hepatic/Liver Function Pnl (80076-HEPATIC)  Patient Instructions: 1)  Your physician recommends that you return for lab work in: lab work today...we will call you with results. 2)  Your physician has requested that you have an echocardiogram.  Echocardiography is a painless test that uses sound waves to create images of your heart. It provides your doctor with  information about the size and shape of your heart and how well your heart's chambers and valves are working.  This procedure takes approximately one hour. There are no restrictions for this procedure. we will call you with results.

## 2010-08-23 NOTE — Progress Notes (Signed)
Summary: labs?  Phone Note Call from Patient   Caller: Patient Call For: Stacie Glaze MD Summary of Call: Pt has appt Aug, 17, 2011.......Marland Kitchenwhat labs will she need to schedule before this appt? [phone] Initial call taken by: Lynann Beaver CMA,  February 16, 2010 9:16 AM  Follow-up for Phone Call        has cardiology visit 8-12--weill see what labs if needed after that ov Follow-up by: Willy Eddy, LPN,  February 16, 2010 9:21 AM

## 2010-08-23 NOTE — Procedures (Signed)
Summary: Holter Report  Holter Report   Imported By: Maryln Gottron 06/29/2010 09:38:00  _____________________________________________________________________  External Attachment:    Type:   Image     Comment:   External Document

## 2010-08-23 NOTE — Progress Notes (Signed)
Summary: EVENT MONITOR  Phone Note Outgoing Call Call back at Work Phone [phone]   Action Taken: Appt scheduled Summary of Call: PT HAS A APPT. [facility] FOR 04/06/10 AT 3PM TO COME IN TO HAVE EVENT MONITOR Initial call taken by: Marcos Eke,  April 04, 2010 4:48 PM

## 2010-08-23 NOTE — Letter (Signed)
Summary: [facility]  [facility]   Imported By: Maryln Gottron 09/30/2009 11:25:13  _____________________________________________________________________  External Attachment:    Type:   Image     Comment:   External Document

## 2010-08-23 NOTE — Progress Notes (Signed)
Summary: results needed  Phone Note Call from Patient Call back at Work Phone [phone]   Caller: Patient---live call Reason for Call: Acute Illness, Lab or Test Results Summary of Call: Requesting lab results from yesterday. Initial call taken by: Warnell Forester,  Nov 30, 2009 2:02 PM  Follow-up for Phone Call        not back yet Follow-up by: Willy Eddy, LPN,  Nov 30, 2009 2:08 PM

## 2010-08-23 NOTE — Procedures (Signed)
Summary: Summary Report  Summary Report   Imported By: Erle Crocker 06/10/2010 15:36:18  _____________________________________________________________________  External Attachment:    Type:   Image     Comment:   External Document

## 2010-08-23 NOTE — Progress Notes (Signed)
----   Converted from flag ---- ---- 01/31/2010 8:10 AM, Stacie Glaze MD wrote: refer to paula  ---- 01/26/2010 6:44 PM, Ronaldo Miyamoto, MD, [facility] wrote: Drinda Butts would recommend [place].  Specifically, she was involved with the adult congenital program at [facility], and this is clearly not my area of expertise.  Thanks however for the referral consideration.  ---- 01/19/2010 6:02 PM, Stacie Glaze MD wrote: tom this pt has repair was common av cannal with avsd repair at age 29 months and followed at [facility]... torelli was MD locally and Babtist has been  difficult she needs a local cardiologist to help [place] as she has done remarkalble well looks like some intermitant hemolytic anemia and we are working with Lauris Chroman can you see here or who do you recommend? ------------------------------

## 2010-08-23 NOTE — Assessment & Plan Note (Signed)
Summary: cpx/cjr/pt rsc/cjr   Vital Signs:  Patient profile:   35 year old female Height:      64.5 inches Weight:      132.4 pounds BMI:     22.46 Temp:     98.2 degrees F oral Pulse rate:   88 / minute Resp:     14 per minute BP sitting:   116 / 80  (left arm)  Vitals Entered By: Willy Eddy, LPN (Nov 30, 8467 3:03 PM) CC: cpx   Primary Care Provider:  Darryll Capers, MD  CC:  cpx.  History of Present Illness: Pt seen from the cardiologist the asities has resolved no significant hepatic conjestion The pt has bacteria in urine but asymptomatic The pt was asked about all immunizations, health maint. services that are appropriate to their age and was given guidance on diet exercize  and weight management   Preventive Screening-Counseling & Management  Alcohol-Tobacco     Smoking Status: never  Problems Prior to Update: 1)  Alopecia  (ICD-704.00) 2)  Headache, Chronic  (ICD-784.0) 3)  Other Acute Sinusitis  (ICD-461.8) 4)  Encounter For Therapeutic Drug Monitoring  (ICD-V58.83) 5)  Encounter For Therapeutic Drug Monitoring  (ICD-V58.83) 6)  Congenital Heart Disease, Hx of  (ICD-V12.59) 7)  Diarrhea  (ICD-787.91) 8)  Dyspnea On Exertion  (ICD-786.09) 9)  Abnormal Transaminase-lft's  (ICD-790.4) 10)  Other Ascites  (ICD-789.59) 11)  Nonspecific Abn Findng Rad&oth Exam Bilary Trct  (ICD-793.3) 12)  Encounter For Therapeutic Drug Monitoring  (ICD-V58.83) 13)  Abdominal Pain, Generalized  (ICD-789.07) 14)  Hyperlipidemia, Borderline, 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) 15)  Physical Examination  (ICD-V70.0) 16)  Back Pain, Lumbar, With Radiculopathy  (ICD-724.4) 17)  Anxiety State Nos  (ICD-300.00) 18)  Migraine Nos W/intractable Migraine  (ICD-346.91) 19)  Coumadin Therapy  (ICD-V58.61) 20)  Mitral Valve Replacement, Hx of  (ICD-V15.1) 21)  Aortic Valve Replacement, Hx of  (ICD-V43.3)  Current Problems (verified): 1)  Alopecia  (ICD-704.00) 2)  Headache, Chronic   (ICD-784.0) 3)  Other Acute Sinusitis  (ICD-461.8) 4)  Encounter For Therapeutic Drug Monitoring  (ICD-V58.83) 5)  Encounter For Therapeutic Drug Monitoring  (ICD-V58.83) 6)  Congenital Heart Disease, Hx of  (ICD-V12.59) 7)  Diarrhea  (ICD-787.91) 8)  Dyspnea On Exertion  (ICD-786.09) 9)  Abnormal Transaminase-lft's  (ICD-790.4) 10)  Other Ascites  (ICD-789.59) 11)  Nonspecific Abn Findng Rad&oth Exam Bilary Trct  (ICD-793.3) 12)  Encounter For Therapeutic Drug Monitoring  (ICD-V58.83) 13)  Abdominal Pain, Generalized  (ICD-789.07) 14)  Hyperlipidemia, Borderline, 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) 15)  Physical Examination  (ICD-V70.0) 16)  Back Pain, Lumbar, With Radiculopathy  (ICD-724.4) 17)  Anxiety State Nos  (ICD-300.00) 18)  Migraine Nos W/intractable Migraine  (ICD-346.91) 19)  Coumadin Therapy  (ICD-V58.61) 20)  Mitral Valve Replacement, Hx of  (ICD-V15.1) 21)  Aortic Valve Replacement, Hx of  (ICD-V43.3)  Medications Prior to Update: 1)  Coumadin 4 Mg Tabs (Warfarin Sodium) .... Held Take 4 Mg By Mouth As Directed 2)  Maxalt-Mlt 10 Mg Tbdp (Rizatriptan Benzoate) .... Take 1 Tablet By Mouth 3)  Ortho-Novum 1/35 (28) 1-35 Mg-Mcg Tabs (Norethindrone-Eth Estradiol) .... Once Daily 4)  Spironolactone 50 Mg Tabs (Spironolactone) .Marland Kitchen.. 1 Once Daily 5)  Folbee Plus Cz 5 Mg Tabs (B-Complex-C-Biotin-Minerals-Fa) .... One By Mouth Daly 6)  Amoxicillin 875 Mg Tabs (Amoxicillin) .... One By Mouth Two Times A Day For 10 Days 7)  Cymbalta 30 Mg Cpep (Duloxetine Hcl) .Marland Kitchen.. 1 Once  Daily 8)  Hydrochlorothiazide 50 Mg Tabs (Hydrochlorothiazide) .Marland Kitchen.. 1 Once Daily  Current Medications (verified): 1)  Coumadin 4 Mg Tabs (Warfarin Sodium) .... Held Take 4 Mg By Mouth As Directed 2)  Maxalt-Mlt 10 Mg Tbdp (Rizatriptan Benzoate) .... Take 1 Tablet By Mouth 3)  Ortho-Novum 1/35 (28) 1-35 Mg-Mcg Tabs (Norethindrone-Eth Estradiol) .... Once Daily 4)  Spironolactone 50 Mg Tabs (Spironolactone) .Marland Kitchen.. 1 Once  Daily 5)  Cymbalta 30 Mg Cpep (Duloxetine Hcl) .Marland Kitchen.. 1 Once Daily 6)  Hydrochlorothiazide 50 Mg Tabs (Hydrochlorothiazide) .Marland Kitchen.. 1 Once Daily 7)  Folbee 2.5-25-1 Mg Tabs (Folic Acid-Vit B6-Vit B12) .... One By Mouth Dialy  Allergies (verified): No Known Drug Allergies  Past History:  Family History: Last updated: 06/26/2008 Family History of Colon Cancer: Paternal Grandmother Family History of Colon Polyps: Father  Social History: Last updated: 06/26/2008 Single Never Smoked Alcohol Use - yes - occasional Illicit Drug Use - no Patient gets regular exercise. Occupation: Engineer, civil (consulting), [employer]  Risk Factors: Exercise: yes (06/26/2008)  Risk Factors: Smoking Status: never (11/29/2009)  Past medical, surgical, family and social histories (including risk factors) reviewed, and no changes noted (except as noted below).  Past Medical History: Reviewed history from 06/26/2008 and no changes required. 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 Family History of Colon Cancer: Paternal Grandmother Family History of Colon Polyps: Father  Social History: Reviewed history from 06/26/2008 and no changes required. Single Never Smoked Alcohol Use - yes - occasional Illicit Drug Use - no Patient gets regular exercise. Occupation: Engineer, civil (consulting), [employer]  Review of Systems  The patient denies anorexia, fever, weight loss, weight gain, vision loss,  decreased hearing, hoarseness, chest pain, syncope, dyspnea on exertion, peripheral edema, prolonged cough, headaches, hemoptysis, abdominal pain, melena, hematochezia, severe indigestion/heartburn, hematuria, incontinence, genital sores, muscle weakness, suspicious skin lesions, transient blindness, difficulty walking, depression, unusual weight change, abnormal bleeding, enlarged lymph nodes, angioedema, breast masses, and testicular masses.    Physical Exam  General:  Well-developed,well-nourished,in no acute distress; alert,appropriate and cooperative throughout examination Head:  Normocephalic and atraumatic without obvious abnormalities. No apparent alopecia or balding. Nose:  External nasal examination shows no deformity or inflammation. Nasal mucosa are pink and moist without lesions or exudates. Mouth:  good dentition.   Neck:  No deformities, masses, or tenderness noted. Lungs:  Normal respiratory effort, chest expands symmetrically. Lungs are clear to auscultation, no crackles or wheezes. Heart:  aotic and mitral mechanical valce sounds Abdomen:  Bowel sounds positive,abdomen soft and non-tender without masses, organomegaly or hernias noted. Msk:  No deformity or scoliosis noted of thoracic or lumbar spine.   Extremities:  No clubbing, cyanosis, edema, or deformity noted with normal full range of motion of all joints.   Neurologic:  No cranial nerve deficits noted. Station and gait are normal. Plantar reflexes are down-going bilaterally. DTRs are symmetrical throughout. Sensory, motor and coordinative functions appear intact.   Impression & Recommendations:  Problem # 1:  ACQUIRED HEMOLYTIC ANEMIA UNSPECIFIED (ICD-283.9) the pt has increase anemia and with the valcves I am concerned for high output failure Hgb:  9.8 (11/16/2009)   Hct: 29.7 (11/16/2009)   Platelets: 226.0 (11/16/2009) RBC: 3.39 (11/16/2009)   RDW: 16.9 (11/16/2009)   WBC: 4.7 (11/16/2009) MCV: 87.5 (11/16/2009)    MCHC: 33.0 (11/16/2009) TSH: 1.80 (11/16/2009)  Orders: TLB-B12 + Folate Pnl ([id number]/[id number]) TLB-IBC Pnl (Iron/FE;Transferrin) (83550-IBC) TLB-CBC Platelet - w/Differential (85025-CBCD)  Her updated medication list for this problem includes:    Folbee 2.5-25-1 Mg Tabs (Folic acid-vit b6-vit b12) ..... One by mouth dialy  Problem # 2:  OTHER ASCITES (ICD-789.59) if the ascites continue consider stress echo to make sure that the valvular  Problem # 3:  PHYSICAL EXAMINATION (ICD-V70.0)  Pap smear: normal (06/22/2009) Td Booster: Historical (02/21/2005)   Chol: 178 (11/16/2009)   HDL: 37.00 (11/16/2009)   LDL: 118 (11/16/2009)   TG: 117.0 (11/16/2009) TSH: 1.80 (11/16/2009)    Discussed using sunscreen, use of alcohol, drug use, self breast exam, routine dental care, routine eye care, schedule for GYN exam, routine physical exam, seat belts, multiple vitamins, osteoporosis prevention, adequate calcium intake in diet, recommendations for immunizations, mammograms and Pap smears.  Discussed exercise and checking cholesterol.  Discussed gun safety, safe sex, and contraception.  Problem # 4:  COUMADIN THERAPY (ICD-V58.61) discussion of coumadin  Problem # 5:  MITRAL VALVE REPLACEMENT, HX OF (ICD-V15.1) on coumadin  Problem # 6:  AORTIC VALVE REPLACEMENT, HX OF (ICD-V43.3) on coumadin  Complete Medication List: 1)  Coumadin 4 Mg Tabs (Warfarin sodium) .... Held take 4 mg by mouth as directed 2)  Maxalt-mlt 10 Mg Tbdp (Rizatriptan benzoate) .... Take 1 tablet by mouth 3)  Ortho-novum 1/35 (28) 1-35 Mg-mcg Tabs (Norethindrone-eth estradiol) .... Once daily 4)  Spironolactone 50 Mg Tabs (Spironolactone) .Marland Kitchen.. 1 once daily 5)  Cymbalta 30 Mg Cpep (Duloxetine hcl) .Marland Kitchen.. 1 once daily 6)  Hydrochlorothiazide 50 Mg Tabs (Hydrochlorothiazide) .Marland Kitchen.. 1 once daily 7)  Folbee 2.5-25-1 Mg Tabs (Folic acid-vit b6-vit b12) .... One by mouth dialy  Other Orders: T-Culture, Urine  ([id number])  Patient Instructions: 1)  Please schedule a follow-up appointment in 2 weeks. Prescriptions: FOLBEE 2.5-25-1 MG TABS (FOLIC ACID-VIT B6-VIT B12) one by mouth dialy  #30 x 11   Entered and Authorized by:   Stacie Glaze MD   Signed by:   Stacie Glaze MD on 11/29/2009   Method used:   Electronically to        [facility]. [phone]* (retail)       [address]       [place], [place]  [address]       Ph: [phone]       Fax: [phone]   RxID:   [id number]    Orders Added: 1)  Est. Patient 40-64 years [99396] 2)  Est. Patient Level III [75643] 3)  T-Culture, Urine [[id number]] 4)  TLB-B12 + Folate Pnl [[id number]/FOL] 5)  TLB-IBC Pnl (Iron/FE;Transferrin) [83550-IBC] 6)  TLB-CBC Platelet - w/Differential [85025-CBCD]    Preventive Care Screening  Pap Smear:    Date:  06/22/2009    Next Due:  06/2010    Results:  normal    Appended Document: Orders Update    Clinical Lists Changes  Orders: Added new Service order of Venipuncture (60630) - Signed Added new Service order of Specimen Handling (16010) - Signed

## 2010-08-23 NOTE — Assessment & Plan Note (Signed)
Summary: labs/bmw   Vital Signs:  Patient profile:   35 year old female Height:      64.5 inches Weight:      132 pounds BMI:     22.39 Temp:     98.2 degrees F oral Pulse rate:   72 / minute Resp:     14 per minute BP sitting:   124 / 76  (left arm)  Vitals Entered By: Willy Eddy, LPN (January 19, 2010 5:12 PM) CC: roa labs   Primary Care Provider:  Darryll Capers, MD  CC:  roa labs.  History of Present Illness: follow up for possible intermintant hemolytic anemia no dark stools no GU blood loss no vomiting or hemptysis intermitnat increased LDH and anemia that the [facility] cardiologist suspects is from perivalular leaks? she was palced on bisoprolol form atenolol with significnat control of tachycardia and much less passive liver failure.   Preventive Screening-Counseling & Management  Alcohol-Tobacco     Smoking Status: never  Problems Prior to Update: 1)  Acute Cystitis  (ICD-595.0) 2)  Acquired Hemolytic Anemia Unspecified  (ICD-283.9) 3)  Alopecia  (ICD-704.00) 4)  Headache, Chronic  (ICD-784.0) 5)  Other Acute Sinusitis  (ICD-461.8) 6)  Encounter For Therapeutic Drug Monitoring  (ICD-V58.83) 7)  Encounter For Therapeutic Drug Monitoring  (ICD-V58.83) 8)  Congenital Heart Disease, Hx of  (ICD-V12.59) 9)  Diarrhea  (ICD-787.91) 10)  Dyspnea On Exertion  (ICD-786.09) 11)  Abnormal Transaminase-lft's  (ICD-790.4) 12)  Other Ascites  (ICD-789.59) 13)  Nonspecific Abn Findng Rad&oth Exam Bilary Trct  (ICD-793.3) 14)  Encounter For Therapeutic Drug Monitoring  (ICD-V58.83) 15)  Abdominal Pain, Generalized  (ICD-789.07) 16)  Hyperlipidemia, Borderline, 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) 17)  Physical Examination  (ICD-V70.0) 18)  Back Pain, Lumbar, With Radiculopathy  (ICD-724.4) 19)  Anxiety State Nos  (ICD-300.00) 20)  Migraine Nos W/intractable Migraine  (ICD-346.91) 21)  Coumadin Therapy  (ICD-V58.61) 22)  Mitral Valve Replacement, Hx of  (ICD-V15.1) 23)  Aortic Valve  Replacement, Hx of  (ICD-V43.3)  Current Problems (verified): 1)  Acute Cystitis  (ICD-595.0) 2)  Acquired Hemolytic Anemia Unspecified  (ICD-283.9) 3)  Alopecia  (ICD-704.00) 4)  Headache, Chronic  (ICD-784.0) 5)  Other Acute Sinusitis  (ICD-461.8) 6)  Encounter For Therapeutic Drug Monitoring  (ICD-V58.83) 7)  Encounter For Therapeutic Drug Monitoring  (ICD-V58.83) 8)  Congenital Heart Disease, Hx of  (ICD-V12.59) 9)  Diarrhea  (ICD-787.91) 10)  Dyspnea On Exertion  (ICD-786.09) 11)  Abnormal Transaminase-lft's  (ICD-790.4) 12)  Other Ascites  (ICD-789.59) 13)  Nonspecific Abn Findng Rad&oth Exam Bilary Trct  (ICD-793.3) 14)  Encounter For Therapeutic Drug Monitoring  (ICD-V58.83) 15)  Abdominal Pain, Generalized  (ICD-789.07) 16)  Hyperlipidemia, Borderline, 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) 17)  Physical Examination  (ICD-V70.0) 18)  Back Pain, Lumbar, With Radiculopathy  (ICD-724.4) 19)  Anxiety State Nos  (ICD-300.00) 20)  Migraine Nos W/intractable Migraine  (ICD-346.91) 21)  Coumadin Therapy  (ICD-V58.61) 22)  Mitral Valve Replacement, Hx of  (ICD-V15.1) 23)  Aortic Valve Replacement, Hx of  (ICD-V43.3)  Medications Prior to Update: 1)  Coumadin 4 Mg Tabs (Warfarin Sodium) .... Held Take 4 Mg By Mouth As Directed 2)  Maxalt-Mlt 10 Mg Tbdp (Rizatriptan Benzoate) .... Take 1 Tablet By Mouth 3)  Ortho-Novum 1/35 (28) 1-35 Mg-Mcg Tabs (Norethindrone-Eth Estradiol) .... Once Daily 4)  Spironolactone 50 Mg Tabs (Spironolactone) .Marland Kitchen.. 1 Once Daily 5)  Cymbalta 30 Mg Cpep (Duloxetine Hcl) .Marland Kitchen.. 1 Once Daily 6)  Hydrochlorothiazide 50 Mg Tabs (Hydrochlorothiazide) .Marland Kitchen.. 1 Once Daily 7)  Folbee 2.5-25-1 Mg Tabs (Folic Acid-Vit B6-Vit B12) .... One By Mouth Dialy 8)  Furosemide 40 Mg Tabs (Furosemide) .Marland Kitchen.. 1 Once Daily 9)  Bisoprolol Fumarate 5 Mg Tabs (Bisoprolol Fumarate) .... 1/2 By Mouth Daily  Current Medications (verified): 1)  Coumadin 4 Mg Tabs (Warfarin Sodium) .... Held Take 4 Mg  By Mouth As Directed 2)  Maxalt-Mlt 10 Mg Tbdp (Rizatriptan Benzoate) .... Take 1 Tablet By Mouth 3)  Ortho-Novum 1/35 (28) 1-35 Mg-Mcg Tabs (Norethindrone-Eth Estradiol) .... Once Daily 4)  Spironolactone 50 Mg Tabs (Spironolactone) .Marland Kitchen.. 1 Once Daily 5)  Cymbalta 30 Mg Cpep (Duloxetine Hcl) .Marland Kitchen.. 1 Once Daily 6)  Hydrochlorothiazide 50 Mg Tabs (Hydrochlorothiazide) .Marland Kitchen.. 1 Once Daily 7)  Folbee 2.5-25-1 Mg Tabs (Folic Acid-Vit B6-Vit B12) .... One By Mouth Dialy 8)  Furosemide 40 Mg Tabs (Furosemide) .Marland Kitchen.. 1 Once Daily 9)  Bisoprolol Fumarate 5 Mg Tabs (Bisoprolol Fumarate) .... One By Mouth Daily 10)  Ferocon  Caps (Fe Fumarate-B12-Vit C-Fa-Ifc) .... One By Mouth Daily  Allergies (verified): No Known Drug Allergies  Past History:  Family History: Last updated: 06/26/2008 Family History of Colon Cancer: Paternal Grandmother Family History of Colon Polyps: Father  Social History: Last updated: 06/26/2008 Single Never Smoked Alcohol Use - yes - occasional Illicit Drug Use - no Patient gets regular exercise. Occupation: Engineer, civil (consulting), [employer]  Risk Factors: Exercise: yes (06/26/2008)  Risk Factors: Smoking Status: never (01/19/2010)  Past medical, surgical, family and social histories (including risk factors) reviewed, and no changes noted (except as noted below).  Past Medical History: Reviewed history from 06/26/2008 and no changes required. 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 Family History of Colon Cancer: Paternal Grandmother Family History of Colon Polyps: Father  Social History: Reviewed history from 06/26/2008 and no changes required. Single Never Smoked Alcohol Use - yes - occasional Illicit Drug Use - no Patient gets regular exercise. Occupation: Engineer, civil (consulting), [employer]  Review of Systems       The patient complains of weight loss, chest pain, and syncope.  The patient denies anorexia, fever, weight gain, vision loss, decreased hearing, hoarseness, dyspnea on exertion, peripheral edema, prolonged cough, headaches, hemoptysis, abdominal pain, melena, hematochezia, severe indigestion/heartburn, hematuria, incontinence, genital sores, muscle weakness, suspicious skin lesions, transient blindness, difficulty walking, depression, unusual weight change, abnormal bleeding, enlarged lymph nodes, angioedema, breast masses, and testicular masses.    Physical Exam  General:  much improved color Head:  normocephalic and atraumatic.   Eyes:  pupils equal and pupils round.   Ears:  R ear normal and L ear normal.   Nose:  no external deformity and no nasal discharge.   Neck:  No deformities, masses, or tenderness noted. Lungs:  normal respiratory effort and no wheezes.   Heart:  aortic and mitral mechanical valve sounds no evidecne of value leakage murmurs ( no thrills) Abdomen:  Bowel sounds positive,abdomen soft and non-tender without masses, organomegaly or hernias noted. Msk:  No deformity or scoliosis noted of thoracic or lumbar spine.  Extremities:  No clubbing, cyanosis, edema, or deformity noted with normal full range of motion of all joints.   Neurologic:  alert & oriented X3, gait normal, and DTRs symmetrical and normal.     Impression & Recommendations:  Problem # 1:  ACQUIRED HEMOLYTIC ANEMIA UNSPECIFIED (ICD-283.9)  probable iron deficient from the intermtant hemolysis from valve leaking Her  updated medication list for this problem includes:    Folbee 2.5-25-1 Mg Tabs (Folic acid-vit b6-vit b12) ..... One by mouth dialy    Ferocon Caps (Fe fumarate-b12-vit c-fa-ifc) ..... 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 Held take 4 mg by mouth as directed    Bisoprolol Fumarate 5 Mg Tabs (Bisoprolol fumarate) ..... One by mouth daily  Labs Reviewed: Na: 139 (12/13/2009)   K+: 3.4 (12/13/2009)   CL: 100 (12/13/2009)   HCO3: 29 (12/13/2009) Ca: 8.7 (12/13/2009)   TSH: 1.80 (11/16/2009)   HCO3: 29 (12/13/2009)  Complete Medication List: 1)  Coumadin 4 Mg Tabs (Warfarin sodium) .... Held take 4 mg by mouth as directed 2)  Maxalt-mlt 10 Mg Tbdp (Rizatriptan benzoate) .... Take 1 tablet by mouth 3)  Ortho-novum 1/35 (28) 1-35 Mg-mcg Tabs (Norethindrone-eth estradiol) .... Once daily 4)  Spironolactone 50 Mg Tabs (Spironolactone) .Marland Kitchen.. 1 once daily 5)  Cymbalta 30 Mg Cpep (Duloxetine hcl) .Marland Kitchen.. 1 once daily 6)  Hydrochlorothiazide 50 Mg Tabs (Hydrochlorothiazide) .Marland Kitchen.. 1 once daily 7)  Folbee 2.5-25-1 Mg Tabs (Folic acid-vit b6-vit b12) .... One by mouth dialy 8)  Furosemide 40 Mg Tabs (Furosemide) .Marland Kitchen.. 1 once daily 9)  Bisoprolol Fumarate 5 Mg Tabs (Bisoprolol fumarate) .... One by mouth daily 10)  Ferocon Caps (Fe fumarate-b12-vit c-fa-ifc) .... One by mouth daily  Patient  Instructions: 1)  Please schedule a follow-up appointment in 1 month. Prescriptions: FEROCON  CAPS (FE FUMARATE-B12-VIT C-FA-IFC) one by mouth daily  #30 x 11   Entered and Authorized by:   Stacie Glaze MD   Signed by:   Stacie Glaze MD on 01/19/2010   Method used:   Electronically to        [facility]. [phone]* (retail)       [address]       [place], [place]  [address]       Ph: [phone]       Fax: [phone]   RxID:   [id number] BISOPROLOL FUMARATE 5 MG TABS (BISOPROLOL FUMARATE) one by mouth daily  #30 x 11   Entered and Authorized by:   Stacie Glaze MD   Signed by:   Stacie Glaze MD on 01/19/2010   Method used:   Electronically to        [facility]. [phone]* (retail)       [phone]  [place], [place]  [address]       Ph: [phone]       Fax: [phone]   RxID:   [id number]   Appended Document: Orders Update    Clinical Lists Changes  Orders: Added new Referral order of Cardiology Referral (Cardiology) - Signed

## 2010-08-23 NOTE — Progress Notes (Signed)
Summary: labs  Phone Note Call from Patient Call back at Work Phone [phone]   Caller: vm Reason for Call: Lab or Test Results Initial call taken by: Rudy Jew, RN,  Dec 15, 2009 4:38 PM  Follow-up for Phone Call       Follow-up by: Willy Eddy, LPN,  Dec 15, 2009 4:49 PM

## 2010-08-23 NOTE — Progress Notes (Signed)
Summary: question regarding monitor (message to PR to review)  Phone Note Call from Patient Call back at [facility] Phone [phone] Call back at Work Phone [phone]   Caller: Patient Reason for Call: Talk to Nurse Details for Reason: Dr. Lovell Sheehan order pt a holter monitor, Dr. [name] ordering the event monitor. pt has question.  Initial call taken by: Lorne Skeens,  April 01, 2010 10:53 AM  Follow-up for Phone Call        I spoke with the pt. She states she is concerned about wearing the event monitor that Dr. [name] has ordered. Her concern is that she has worn multiple monitors with no real arrhythmias noted. She states she spoke with Dr. [name] about her holter results last week. She was told that when she documented her symptoms, that she was not having any abnormal rhythms at that time. She is worried that if she's wears the event monitor and is supposed to record the events when she has "symptoms" that it will not show anything. I explained to the pt that I would relay this message to Dr. [name]. I explained their are some types of monitors that have an autotrigger and this may be what she needs. We will call her back after review with Dr. [name]. Follow-up by: Sherri Rad, RN, BSN,  April 01, 2010 11:04 AM  Additional Follow-up for Phone Call Additional follow up Details #1::        Yes, I would get the monitor with autotrigger.  She should be able to trigger as well.  I had spoken with Dr. Vladimir Faster at [facility] who also recommended that she wear one. Additional Follow-up by: Sherrill Raring, MD, [facility],  April 05, 2010 12:00 PM     Appended Document: question regarding monitor (message to PR to review) Flag sent to Windell Moulding to review this note.

## 2010-08-23 NOTE — Assessment & Plan Note (Signed)
Summary: pt/cjr  Nurse Visit   Allergies: No Known Drug Allergies Laboratory Results   Blood Tests     PT: 15.0 s   (Normal Range: 10.6-13.4)  INR: 1.5   (Normal Range: 0.88-1.12   Therap INR: 2.0-3.5) Comments: Rita Ohara  September 28, 2009 2:16 PM     Orders Added: 1)  Est. Patient Level I [99211] 2)  Protime [[id number]]    ANTICOAGULATION RECORD PREVIOUS REGIMEN & LAB RESULTS Anticoagulation Diagnosis:  v43.3,v15.1,v58.61 on  02/11/2007 Previous INR Goal Range:  2.5-3.5 on  02/11/2007 Previous INR:  2.4 on  02/19/2009 Previous Coumadin Dose(mg):  4mg ,2mg  altinate on  07/28/2008 Previous Regimen:  2mg  qd on  07/13/2008 Previous Coagulation Comments:  Hold for 3 days on  07/13/2008  NEW REGIMEN & LAB RESULTS Current INR: 1.5 Regimen: 8mg . just today then resume 4mg . QD  Repeat testing in: 2 weeks  Anticoagulation Visit Questionnaire Coumadin dose missed/changed:  No Abnormal Bleeding Symptoms:  No  Any diet changes including alcohol intake, vegetables or greens since the last visit:  No Any illnesses or hospitalizations since the last visit:  No Any signs of clotting since the last visit (including chest discomfort, dizziness, shortness of breath, arm tingling, slurred speech, swelling or redness in leg):  Yes  MEDICATIONS COUMADIN 4 MG TABS (WARFARIN SODIUM) HELD Take 4 mg by mouth as directed MAXALT-MLT 10 MG TBDP (RIZATRIPTAN BENZOATE) Take 1 tablet by mouth ORTHO-NOVUM 1/35 (28) 1-35 MG-MCG TABS (NORETHINDRONE-ETH ESTRADIOL) once daily ALDACTAZIDE 25-25 MG TABS (SPIRONOLACTONE-HCTZ)  FOLBEE PLUS CZ 5 MG TABS (B-COMPLEX-C-BIOTIN-MINERALS-FA) one by mouth daly AMOXICILLIN 875 MG TABS (AMOXICILLIN) one by mouth two times a day for 10 days CYMBALTA 30 MG CPEP (DULOXETINE HCL) 1 once daily

## 2010-08-23 NOTE — Progress Notes (Signed)
Summary: Pt has questions re: Coumedin  LMTCB 11/25/2009  Phone Note Call from Patient Call back at [phone] cell   Caller: Patient Summary of Call: Pt called and has question re: Coumadin. Pls call asap.  Left message to call back.  Rudy Jew, RN  Nov 24, 2009 3:53 PM  Initial call taken by: Lucy Antigua,  Nov 24, 2009 3:38 PM  Follow-up for Phone Call        [facility] Follow-up by: Lynann Beaver CMA,  Nov 26, 2009 8:00 AM

## 2010-08-23 NOTE — Progress Notes (Signed)
Summary: message for Dr. [name]  Phone Note Call from Patient Call back at Work Phone [phone]   Caller: Patient Reason for Call: Talk to Nurse, Talk to Doctor Summary of Call: pt rtn call to Dr. [name] and pt needs all info sent to Dr. Vladimir Faster  Initial call taken by: Omer Jack,  April 27, 2010 4:38 PM  Follow-up for Phone Call        Ms. Stanaland called to give the fax number for Dr. Vladimir Faster  [phone] in [place].  She thought Dr. [name] wanted to send the images of her ECHO.  I told her I would forward this to Dr. [name] and her nurse Annice Pih. Mylo Red RN     Appended Document: message for Dr. [name] Monitor results and echo were fedex'd to Dr. Marton Redwood office.

## 2010-08-23 NOTE — Progress Notes (Signed)
Summary: Holter Monitor results  Phone Note Call from Patient Call back at Work Phone [phone]   Caller: Patient Call For: Stacie Glaze MD Reason for Call: Acute Illness Summary of Call: Pt is asking for Holter Monitor results.  Initial call taken by: Lynann Beaver CMA,  March 24, 2010 3:37 PM  Follow-up for Phone Call        results are not back yet- left message on machine for pt Follow-up by: Willy Eddy, LPN,  March 25, 2010 8:26 AM

## 2010-08-23 NOTE — Assessment & Plan Note (Signed)
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

## 2010-08-23 NOTE — Assessment & Plan Note (Signed)
Summary: np6/PVT/Jessica [name]   Visit Type:  Initial Consult Primary Provider:  Darryll Capers, MD  CC:  NP6  PVT.  History of Present Illness: Patient is a 35 year old with a history of complex congenital heart disease and a very complicated history..   She continues to be followed by Jessica [name], last seen in March of this year.  She is due to see him again in October. She has a history of AV canal defect, s/p repair early in life.  Review of her records shows she underwent MV replacement initially with heterograft and then 23 mm St Jude mechanical valve.   In May 1991 she underwent modified Konno operation with a 25 mm St Jude valve in the aortic position. In 1995 she had a 23 mm Carbomedics valve placed in the mitral position for a partially obstructed valve.  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  All surgeries were done at [facility]. She went to [facility] earlier this year with SOB, evid of some volume overload.  She had a cardiac cath and echoes that showed mod to severe   PI, mild to mod TR, perivalvular MV leak (present in past) with moderate MR.  There was trivial AI.  LV and RV function were normal  RV mildly dilated  Atria were mildly dilated..  No fistula was seen.  Pulmonary pressures were mildly increased.  RV pressures normal. After this visit the patient was placed on Atenolol ?secondary to noted tachypalpitations.  This was complicated by diarrhea.  Symptoms of diarrhea resolved and the patient returned to feeling good after she was switched to bisoprolol. She has been followed in [place]  by Jessica [name] and Jessica [name].  Unfortunately, Jessica [name] is no longer practicing in the area and Jessica [name] has infrequent clinics.  She is referred for continued local cardiac care. She denies SOB.  She is active, walking regularly on the treadmill (3 miles/hr) for 30 min.  She is working at increasing this speed.  She denies palpitations.  No dizziness.  No LE  edema.  Allergies: No Known Drug Allergies  Past History:  Family History: Last updated: 06/26/2008 Family History of Colon Cancer: Paternal Grandmother Family History of Colon Polyps: Father  Social History: Last updated: 06/26/2008 Single Never Smoked Alcohol Use - yes - occasional Illicit Drug Use - no Patient gets regular exercise. 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  Negative to the above problem except as noted above.  Vital Signs:  Patient profile:   36 year old female Height:      64 inches Weight:      136 pounds BMI:     23.43 Pulse rate:   67 / minute BP sitting:   115 / 79  (left arm) Cuff size:   regular  Vitals Entered By: Jessica Kanaris, CNA (March 04, 2010 11:58 AM)  Physical Exam  General:  Well developed, well nourished, in no acute distress. Head:  normocephalic and atraumatic Eyes:  PERRLA/EOM intact; conjunctiva and lids normal. Mouth:  Teeth, gums and palate normal. Oral mucosa  normal. Neck:  JVP approx 9.  NO thyromegaly. Lungs:  CTA.  NO rales or wheezes. Heart:  RRR.  S!, S2.  Crisp valve sounds.  Gr II/VI systolic murmur LLSB.  Gr 1/VI diastolic murmur LUSB.  No S3.  PMI not displaced. Abdomen:  Supple  No hepatomegaly.  Normal bowel sounds.  No masses Msk:  Back normal, normal gait. Muscle strength and tone normal. Pulses:  2+ pulses, equal onset throughout. Extremities:  No lower extremity edema.  Small scabs on RLE  Neurologic:  Alert and oriented x  3.   Impression & Recommendations:  Problem # 1:  CONGENITAL HEART DISEASE, HX OF (ICD-V12.59)  Patient has a very complicated history.  She has been followed since early life by the pediatric cardilogy/ pediatric cardiac surgery programs at [facility]. Most recently was seen in the [place].   Since starting bisoprolol she has been feeling better.  (She says she has always had unusual symptoms) On exam today, volume status looks good.   She has her coumadin checked through Dr. Lovell [name] office,  often infrequently.  I have encouraged her to return to a monthly schedule given her history.  She will discuss this when she returns to [place]. She is active and is building her endurance.  I have encouraged her to continue to do this. I plan to contact Jessica [name] over the next several days to discuss.  I plan to be available examine her periodically and evaluate for changes,  working with [facility] in her care.  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  Perivalvular leak will need to be followed.  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

## 2010-08-23 NOTE — Progress Notes (Signed)
Summary: pt has questions for Dr. [name]  Phone Note Call from Patient Call back at Work Phone [phone]   Caller: Patient Reason for Call: Talk to Nurse, Talk to Doctor Summary of Call: pt saw Dr. [name] for the first time and she has some general questions she forgot to ask about during the visit Initial call taken by: Omer Jack,  March 22, 2010 8:09 AM  Follow-up for Phone Call        would like to talk to Dr [name]. Pt has specific questions related to her care that she would like to discuss with her.  Informed pt Dr [name] back in office on Thursday and that I will forward this to her . Follow-up by: Charolotte Capuchin, RN,  March 22, 2010 8:31 AM  Additional Follow-up for Phone Call Additional follow up Details #1::        Spoke with patient.  Also spoke with E. Colvin in [place].  will forward records to him. Will also set up event monitor. Additional Follow-up by: Sherrill Raring, MD, [facility] [address],  March 29, 2010 6:52 PM     Appended Document: pt has questions for Dr. 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

## 2010-08-23 NOTE — Progress Notes (Signed)
Summary: refills  Phone Note Call from Patient   Caller: Patient Call For: Stacie Glaze MD Summary of Call: Pt needs refills on Spironolactone 50 mg, and HCTZ 50 mg. .  Has been prescribed bt a different MD, but would like Dr. Lovell Sheehan to start prescribing. [facility] ([address] [place]) Initial call taken by: Lynann Beaver CMA,  Nov 24, 2009 8:03 AM    New/Updated Medications: SPIRONOLACTONE 50 MG TABS (SPIRONOLACTONE) 1 once daily HYDROCHLOROTHIAZIDE 50 MG TABS (HYDROCHLOROTHIAZIDE) 1 once daily Prescriptions: HYDROCHLOROTHIAZIDE 50 MG TABS (HYDROCHLOROTHIAZIDE) 1 once daily  #30 x 6   Entered by:   Willy Eddy, LPN   Authorized by:   Stacie Glaze MD   Signed by:   Willy Eddy, LPN on 28/41/3244   Method used:   Electronically to        [facility] [address] [phone]* (retail)       [address] [place], [place]  [address]       Ph: [phone]       Fax: [phone]   RxID:   [id number] SPIRONOLACTONE 50 MG TABS (SPIRONOLACTONE) 1 once daily  #30 x 6   Entered by:   Willy Eddy, LPN   Authorized by:   Stacie Glaze MD   Signed by:   Willy Eddy, LPN on 08/01/3233   Method used:   Electronically to        [facility] [address] [phone]* (retail)       [address] [place], [place]  [address]       Ph: [phone]       Fax: [phone]   RxID:   [id number]

## 2010-08-23 NOTE — Letter (Signed)
Summary: Generic Letter  Architectural technologist, Main Office  [address]   [place], [place] [address]   Phone: [phone]  Fax: [phone]                       May 02, 2010        Wynn Maudlin, M.D. Professor of Pediatrics Division of Pediatric Cardiology [place], [place] [address] [place], [place] [address]  Dear Renae Fickle,    When we spoke last I had told you about Cheril's complaints of palpitations.  Holter monitor showed no significant arrhythmia.  Enclosed are strips of  recordings from the event monitor that Shantrell has worn since then.  There appear to be several bursts of SVT.  Carmaleta has continued to complain of fatigue and  feeling her heart race at times.   I have asked her to increase her bisoprolol from 5 mg per day to 5 mg AM / 2.5 mg PM.    Angel also had an echo done in our office last week.  A CD is enclosed for your review.  Her estimated PA pressure (60 to 65 mm Hg) was mildly increased from that reported previously.  Otherwise, there did not appear to be significant change from the previous report.  Of note, during the study Hera did have several short bursts of tachycardia.  She did not, however, sense these when they occurred.      I will keep up with the monitor and send you more strips if there are any more with significant arrhythmias.  Consetta is coming down to [facility] to see you at the end of this month.                 Sincerely,                          Appended Document: Generic Letter Faxed to Dr.Colvin [phone].

## 2010-08-23 NOTE — Progress Notes (Signed)
Summary: Iron Supplement  Phone Note Call from Patient Call back at Work Phone [phone]   Caller: Patient Summary of Call: Started iron supplement 2 weeks ago and having digestive issues (dark stool and upset stomach).  Is this normal?  Received a message from Terri asking me to call her back, not sure what it was about.   Initial call taken by: Trixie Dredge,  February 09, 2010 9:08 AM  Follow-up for Phone Call        pt informed per dr Lovell Sheehan- talke with pharmacist to recommend an iron supplement that would not hurt stomach, pt also informed of appointment with ross Follow-up by: Willy Eddy, LPN,  February 09, 2010 10:15 AM

## 2010-08-23 NOTE — Assessment & Plan Note (Signed)
Summary: 1 month follow up/pt mention that Dr wanted to do cbc?/cjr   Vital Signs:  Patient profile:   35 year old female Height:      64 inches Weight:      136 pounds BMI:     23.43 Temp:     98.2 degrees F oral Pulse rate:   68 / minute Resp:     14 per minute BP sitting:   110 / 70  (left arm)  Vitals Entered By: Willy Eddy, LPN (March 09, 2010 3:33 PM) CC: roa labs Is Patient Diabetic? No   Primary Care Provider:  Darryll Capers, MD  CC:  roa labs.  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) 2)  Acute Cystitis  (ICD-595.0) 3)  Acquired Hemolytic Anemia Unspecified  (ICD-283.9) 4)  Alopecia  (ICD-704.00) 5)  Headache, Chronic  (ICD-784.0) 6)  Other Acute Sinusitis  (ICD-461.8) 7)  Encounter For Therapeutic Drug Monitoring  (ICD-V58.83) 8)  Encounter For Therapeutic Drug Monitoring  (ICD-V58.83) 9)  Congenital Heart Disease, Hx of  (ICD-V12.59) 10)  Diarrhea  (ICD-787.91) 11)  Dyspnea On Exertion  (ICD-786.09) 12)  Abnormal Transaminase-lft's  (ICD-790.4) 13)  Other Ascites  (ICD-789.59) 14)  Nonspecific Abn Findng Rad&oth Exam Bilary Trct  (ICD-793.3) 15)  Encounter For Therapeutic Drug Monitoring  (ICD-V58.83) 16)  Abdominal Pain, Generalized  (ICD-789.07) 17)  Hyperlipidemia, Borderline, 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) 18)  Physical Examination  (ICD-V70.0) 19)  Back Pain, Lumbar, With Radiculopathy  (ICD-724.4) 20)  Anxiety State Nos  (ICD-300.00) 21)  Migraine Nos W/intractable  Migraine  (ICD-346.91) 22)  Coumadin Therapy  (ICD-V58.61) 23)  Mitral Valve Replacement, Hx of  (ICD-V15.1) 24)  Aortic Valve Replacement, Hx of  (ICD-V43.3)  Current Problems (verified): 1)  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) 2)  Acute Cystitis  (ICD-595.0) 3)  Acquired Hemolytic Anemia Unspecified  (ICD-283.9) 4)  Alopecia  (ICD-704.00) 5)  Headache, Chronic  (ICD-784.0) 6)  Other Acute Sinusitis  (ICD-461.8) 7)  Encounter For Therapeutic Drug Monitoring  (ICD-V58.83) 8)  Encounter For Therapeutic Drug Monitoring  (ICD-V58.83) 9)  Congenital Heart Disease, Hx of  (ICD-V12.59) 10)  Diarrhea  (ICD-787.91) 11)  Dyspnea On Exertion  (ICD-786.09) 12)  Abnormal Transaminase-lft's  (ICD-790.4) 13)  Other Ascites  (ICD-789.59) 14)  Nonspecific Abn Findng Rad&oth Exam Bilary Trct  (ICD-793.3) 15)  Encounter For Therapeutic Drug Monitoring  (ICD-V58.83) 16)  Abdominal Pain, Generalized  (ICD-789.07) 17)  Hyperlipidemia, Borderline, 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) 18)  Physical Examination  (ICD-V70.0) 19)  Back Pain, Lumbar, With Radiculopathy  (ICD-724.4) 20)  Anxiety State Nos  (ICD-300.00) 21)  Migraine Nos W/intractable Migraine  (ICD-346.91) 22)  Coumadin Therapy  (ICD-V58.61) 23)  Mitral Valve Replacement, Hx of  (ICD-V15.1) 24)  Aortic Valve Replacement, Hx of  (ICD-V43.3)  Medications Prior to Update: 1)  Coumadin 4 Mg Tabs (Warfarin Sodium) .... Held Take 4 Mg By Mouth As Directed 2)  Maxalt-Mlt 10 Mg Tbdp (Rizatriptan Benzoate) .... Take 1 Tablet  By Mouth 3)  Ortho-Novum 1/35 (28) 1-35 Mg-Mcg Tabs (Norethindrone-Eth Estradiol) .... Once Daily 4)  Spironolactone 50 Mg Tabs (Spironolactone) .Marland Kitchen.. 1 Time Every Other Day 5)  Cymbalta 30 Mg Cpep (Duloxetine Hcl) .Marland Kitchen.. 1 Once Daily 6)  Hydrochlorothiazide 50 Mg Tabs (Hydrochlorothiazide) .Marland Kitchen.. 1 Every Other Day 7)  Folbee 2.5-25-1 Mg Tabs (Folic Acid-Vit B6-Vit B12) .... One By Mouth Dialy 8)  Furosemide 40 Mg Tabs  (Furosemide) .Marland Kitchen.. 1 Once Daily 9)  Bisoprolol Fumarate 5 Mg Tabs (Bisoprolol Fumarate) .... One By Mouth Daily 10)  Ferocon  Caps (Fe Fumarate-B12-Vit C-Fa-Ifc) .... One By Mouth Daily  Current Medications (verified): 1)  Coumadin 4 Mg Tabs (Warfarin Sodium) .... Held Take 4 Mg By Mouth As Directed 2)  Maxalt-Mlt 10 Mg Tbdp (Rizatriptan Benzoate) .... Take 1 Tablet By Mouth 3)  Ortho-Novum 1/35 (28) 1-35 Mg-Mcg Tabs (Norethindrone-Eth Estradiol) .... Once Daily 4)  Spironolactone 50 Mg Tabs (Spironolactone) .Marland Kitchen.. 1 Time Every Other Day 5)  Cymbalta 30 Mg Cpep (Duloxetine Hcl) .Marland Kitchen.. 1 Once Daily 6)  Hydrochlorothiazide 50 Mg Tabs (Hydrochlorothiazide) .Marland Kitchen.. 1 Every Other Day 7)  Folbee 2.5-25-1 Mg Tabs (Folic Acid-Vit B6-Vit B12) .... One By Mouth Dialy 8)  Furosemide 40 Mg Tabs (Furosemide) .Marland Kitchen.. 1 Once Daily 9)  Bisoprolol Fumarate 5 Mg Tabs (Bisoprolol Fumarate) .... One By Mouth Daily 10)  Ferocon  Caps (Fe Fumarate-B12-Vit C-Fa-Ifc) .... One By Mouth Daily  Allergies (verified): No Known Drug Allergies  Past History:  Family History: Last updated: 06/26/2008 Family History of Colon Cancer: Paternal Grandmother Family History of Colon Polyps: Father  Social History: Last updated: 06/26/2008 Single Never Smoked Alcohol Use - yes - occasional Illicit Drug Use - no Patient gets regular exercise. Occupation: Engineer, civil (consulting), [employer]  Risk Factors: Exercise: yes (06/26/2008)  Risk Factors: Smoking Status: never (03/09/2010)  Past medical, surgical, family and social histories (including risk factors) reviewed, and no changes noted (except as noted below).  Past Medical History: Reviewed history from 03/04/2010 and no changes required. 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 Family History of Colon Cancer: Paternal Grandmother Family History of Colon Polyps: Father  Social History: Reviewed history from 06/26/2008 and no changes required. Single Never Smoked Alcohol Use - yes - occasional Illicit Drug Use - no Patient gets regular exercise. Occupation: Engineer, civil (consulting), [employer]  Review of Systems       The patient complains of hoarseness and dyspnea on exertion.  The patient denies anorexia, fever, weight loss, weight gain, vision loss, decreased hearing, chest pain, syncope, peripheral edema, prolonged cough, headaches, hemoptysis, abdominal pain, melena, hematochezia, severe indigestion/heartburn, hematuria, incontinence, genital sores, muscle weakness, suspicious skin lesions, transient blindness, difficulty walking, depression, unusual weight change, abnormal bleeding, enlarged lymph nodes, angioedema, and breast masses.    Physical Exam  General:  much improved color well-developed, well-nourished, and well-hydrated.   Head:  normocephalic and atraumatic.   Eyes:  pupils equal and pupils round.   Ears:  R ear normal and L ear normal.   Nose:  no external deformity and no nasal discharge.   Mouth:  good  dentition.   Neck:  No deformities, masses, or tenderness noted. Lungs:  normal respiratory effort and no wheezes.   Heart:  aortic and mitral mechanical valve sounds no evidecne of value leakage murmurs ( no thrills) Abdomen:  soft and normal bowel sounds.     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 Held take 4 mg by mouth as directed    Bisoprolol Fumarate 5 Mg Tabs (Bisoprolol fumarate) ..... One by mouth daily  Orders: Cardiology Referral (Cardiology) for holter  Labs Reviewed: Na: 139 (12/13/2009)   K+: 3.4 (12/13/2009)   CL: 100 (12/13/2009)   HCO3: 29 (12/13/2009) Ca: 8.7 (12/13/2009)   TSH: 1.80 (11/16/2009)   HCO3: 29 (12/13/2009)  Problem # 2:  ABNORMAL TRANSAMINASE-LFT'S (ICD-790.4) normal  Problem # 3:  COUMADIN THERAPY (ICD-V58.61) stable  Problem # 4:  ACQUIRED HEMOLYTIC ANEMIA UNSPECIFIED (ICD-283.9) moniter labs and ldl for hemolysis send labs to cardiologist for consideratin of intervention Her updated medication list for this problem includes:    Folbee 2.5-25-1 Mg Tabs (Folic acid-vit b6-vit b12) ..... One by mouth dialy    Ferocon Caps (Fe fumarate-b12-vit c-fa-ifc) ..... One by mouth daily  Orders: Venipuncture (28413) T-Lactate Dehydrogenase (LDH) (24401-02725)  Complete Medication List: 1)  Coumadin 4 Mg Tabs (Warfarin sodium) .... Held take 4 mg by mouth as directed 2)  Maxalt-mlt 10 Mg Tbdp (Rizatriptan benzoate) .... Take 1 tablet by mouth 3)  Ortho-novum 1/35 (28) 1-35 Mg-mcg Tabs (Norethindrone-eth estradiol) .... Once daily 4)  Spironolactone 50 Mg Tabs (Spironolactone) .Marland Kitchen.. 1 time every other day 5)  Cymbalta 30 Mg Cpep (Duloxetine hcl) .Marland Kitchen.. 1 once daily 6)  Hydrochlorothiazide 50 Mg Tabs (Hydrochlorothiazide) .Marland Kitchen.. 1 every other day 7)  Folbee 2.5-25-1 Mg Tabs (Folic acid-vit b6-vit b12) .... One by mouth dialy 8)  Furosemide 40 Mg Tabs (Furosemide) .Marland Kitchen.. 1 once daily 9)  Bisoprolol Fumarate 5 Mg Tabs (Bisoprolol fumarate) .... One by mouth daily 10)  Ferocon Caps (Fe fumarate-b12-vit c-fa-ifc) .... One by mouth daily  Other Orders: Vit B12 1000 mcg (J3420) Admin of Therapeutic Inj  intramuscular or subcutaneous (36644)  Patient Instructions: 1)  nasal irrigation with wash bottle style 2)  Please schedule a follow-up  appointment in 2 months.   Medication Administration  Injection # 1:    Medication: Vit B12 1000 mcg    Diagnosis: B12 DEFICIENCY (ICD-266.2)    Route: IM    Site: L deltoid    Exp Date: 09/21/2011    Lot #: [id number]    Mfr: [employer] Regent    Patient tolerated injection without complications    Given by: Willy Eddy, LPN (March 09, 2010 4:30 PM)  Orders Added: 1)  Est. Patient Level IV [03474] 2)  Venipuncture [25956] 3)  T-Lactate Dehydrogenase (LDH) [38756-43329] 4)  Vit B12 1000 mcg [J3420] 5)  Admin of Therapeutic Inj  intramuscular or subcutaneous [96372] 6)  Cardiology Referral [Cardiology]

## 2010-08-23 NOTE — Assessment & Plan Note (Signed)
Summary: 2 week fup//ccm   Vital Signs:  Patient profile:   35 year old female Height:      64.5 inches Weight:      132 pounds BMI:     22.39 Temp:     98.2 degrees F oral Pulse rate:   76 / minute Resp:     14 per minute BP sitting:   110 / 70  (left arm)  Vitals Entered By: Willy Eddy, LPN (Dec 13, 2009 4:11 PM) CC: c/o diarrhea and starting menses this am unexpectedly -see labs   Primary Care Provider:  Darryll Capers, MD  CC:  c/o diarrhea and starting menses this am unexpectedly -see labs.  History of Present Illness: Since she started the atenolol she has had loose stools the pt has diarrhea and had essetially unchanged blood work except mild hypokalemia we have decided to work up infectons diarrhea but the proximate cause is the medicaions she will discusss the changes we are proposing with her primary cardiologist and we will wait fro his aggrement to change medications since she has a planned [place] trip!  Preventive Screening-Counseling & Management  Alcohol-Tobacco     Smoking Status: never  Problems Prior to Update: 1)  Acute Cystitis  (ICD-595.0) 2)  Acquired Hemolytic Anemia Unspecified  (ICD-283.9) 3)  Alopecia  (ICD-704.00) 4)  Headache, Chronic  (ICD-784.0) 5)  Other Acute Sinusitis  (ICD-461.8) 6)  Encounter For Therapeutic Drug Monitoring  (ICD-V58.83) 7)  Encounter For Therapeutic Drug Monitoring  (ICD-V58.83) 8)  Congenital Heart Disease, Hx of  (ICD-V12.59) 9)  Diarrhea  (ICD-787.91) 10)  Dyspnea On Exertion  (ICD-786.09) 11)  Abnormal Transaminase-lft's  (ICD-790.4) 12)  Other Ascites  (ICD-789.59) 13)  Nonspecific Abn Findng Rad&oth Exam Bilary Trct  (ICD-793.3) 14)  Encounter For Therapeutic Drug Monitoring  (ICD-V58.83) 15)  Abdominal Pain, Generalized  (ICD-789.07) 16)  Hyperlipidemia, Borderline, 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) 17)  Physical Examination  (ICD-V70.0) 18)  Back Pain, Lumbar, With Radiculopathy  (ICD-724.4) 19)  Anxiety  State Nos  (ICD-300.00) 20)  Migraine Nos W/intractable Migraine  (ICD-346.91) 21)  Coumadin Therapy  (ICD-V58.61) 22)  Mitral Valve Replacement, Hx of  (ICD-V15.1) 23)  Aortic Valve Replacement, Hx of  (ICD-V43.3)  Current Problems (verified): 1)  Acute Cystitis  (ICD-595.0) 2)  Acquired Hemolytic Anemia Unspecified  (ICD-283.9) 3)  Alopecia  (ICD-704.00) 4)  Headache, Chronic  (ICD-784.0) 5)  Other Acute Sinusitis  (ICD-461.8) 6)  Encounter For Therapeutic Drug Monitoring  (ICD-V58.83) 7)  Encounter For Therapeutic Drug Monitoring  (ICD-V58.83) 8)  Congenital Heart Disease, Hx of  (ICD-V12.59) 9)  Diarrhea  (ICD-787.91) 10)  Dyspnea On Exertion  (ICD-786.09) 11)  Abnormal Transaminase-lft's  (ICD-790.4) 12)  Other Ascites  (ICD-789.59) 13)  Nonspecific Abn Findng Rad&oth Exam Bilary Trct  (ICD-793.3) 14)  Encounter For Therapeutic Drug Monitoring  (ICD-V58.83) 15)  Abdominal Pain, Generalized  (ICD-789.07) 16)  Hyperlipidemia, Borderline, 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) 17)  Physical Examination  (ICD-V70.0) 18)  Back Pain, Lumbar, With Radiculopathy  (ICD-724.4) 19)  Anxiety State Nos  (ICD-300.00) 20)  Migraine Nos W/intractable Migraine  (ICD-346.91) 21)  Coumadin Therapy  (ICD-V58.61) 22)  Mitral Valve Replacement, Hx of  (ICD-V15.1) 23)  Aortic Valve Replacement, Hx of  (ICD-V43.3)  Medications Prior to Update: 1)  Coumadin 4 Mg Tabs (Warfarin Sodium) .... Held Take 4 Mg By Mouth As Directed 2)  Maxalt-Mlt 10 Mg Tbdp (Rizatriptan Benzoate) .... Take 1 Tablet By Mouth 3)  Ortho-Novum  1/35 (28) 1-35 Mg-Mcg Tabs (Norethindrone-Eth Estradiol) .... Once Daily 4)  Spironolactone 50 Mg Tabs (Spironolactone) .Marland Kitchen.. 1 Once Daily 5)  Cymbalta 30 Mg Cpep (Duloxetine Hcl) .Marland Kitchen.. 1 Once Daily 6)  Hydrochlorothiazide 50 Mg Tabs (Hydrochlorothiazide) .Marland Kitchen.. 1 Once Daily 7)  Folbee 2.5-25-1 Mg Tabs (Folic Acid-Vit B6-Vit B12) .... One By Mouth Dialy  Current Medications (verified): 1)  Coumadin 4  Mg Tabs (Warfarin Sodium) .... Held Take 4 Mg By Mouth As Directed 2)  Maxalt-Mlt 10 Mg Tbdp (Rizatriptan Benzoate) .... Take 1 Tablet By Mouth 3)  Ortho-Novum 1/35 (28) 1-35 Mg-Mcg Tabs (Norethindrone-Eth Estradiol) .... Once Daily 4)  Spironolactone 50 Mg Tabs (Spironolactone) .Marland Kitchen.. 1 Once Daily 5)  Cymbalta 30 Mg Cpep (Duloxetine Hcl) .Marland Kitchen.. 1 Once Daily 6)  Hydrochlorothiazide 50 Mg Tabs (Hydrochlorothiazide) .Marland Kitchen.. 1 Once Daily 7)  Folbee 2.5-25-1 Mg Tabs (Folic Acid-Vit B6-Vit B12) .... One By Mouth Dialy 8)  Furosemide 40 Mg Tabs (Furosemide) .Marland Kitchen.. 1 Once Daily 9)  Bisoprolol Fumarate 5 Mg Tabs (Bisoprolol Fumarate) .... 1/2 By Mouth Daily  Allergies (verified): No Known Drug Allergies  Past History:  Family History: Last updated: 06/26/2008 Family History of Colon Cancer: Paternal Grandmother Family History of Colon Polyps: Father  Social History: Last updated: 06/26/2008 Single Never Smoked Alcohol Use - yes - occasional Illicit Drug Use - no Patient gets regular exercise. Occupation: Engineer, civil (consulting), [employer]  Risk Factors: Exercise: yes (06/26/2008)  Risk Factors: Smoking Status: never (12/13/2009)  Past medical, surgical, family and social histories (including risk factors) reviewed, and no changes noted (except as noted below).  Past Medical History: Reviewed history from 06/26/2008 and no changes required. 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 Family History of Colon Cancer: Paternal Grandmother Family History of Colon Polyps: Father  Social History: Reviewed history from 06/26/2008 and no changes required. Single Never Smoked Alcohol Use - yes - occasional Illicit Drug Use - no Patient gets regular exercise. Occupation: Engineer, civil (consulting), [employer]  Review of Systems  The patient denies anorexia, fever, weight loss, weight gain, vision loss, decreased hearing, hoarseness, chest pain, syncope, dyspnea on exertion, peripheral edema, prolonged cough, headaches, hemoptysis, abdominal pain, melena, hematochezia, severe indigestion/heartburn, hematuria, incontinence, genital sores, muscle weakness, suspicious skin lesions, transient blindness, difficulty walking, depression, unusual weight change, abnormal bleeding, enlarged lymph nodes, angioedema, and breast masses.         persistant mild jaundice  Physical Exam  General:  alert, well-developed, and dusky.   Head:  normocephalic and atraumatic.   Eyes:  pupils equal and pupils reactive to light.   Ears:  L ear normal.   Neck:  No deformities, masses, or tenderness noted. Lungs:  normal respiratory effort and no wheezes.   Heart:  aortic and mitral mechanical valve sounds no evidecne of value leakage murmurs ( no thrills) Abdomen:  Bowel sounds positive,abdomen soft and non-tender without masses, organomegaly or hernias noted. Msk:  No deformity or scoliosis noted of thoracic or lumbar spine.   Extremities:  No clubbing, cyanosis, edema, or deformity noted with normal full range of motion of  all joints.   Neurologic:  alert & oriented X3, gait normal, and DTRs symmetrical and normal.     Impression & Recommendations:  Problem # 1:  DIARRHEA (ICD-787.91)  this may be related to the medications moniterin electrolyte stool studies and change the BB  Discussed symptom control and diet. Call if worsening of symptoms or signs of dehydration.   Problem #  2:  ACQUIRED HEMOLYTIC ANEMIA UNSPECIFIED (ICD-283.9)  due to the valves Her updated medication list for this problem includes:    Folbee 2.5-25-1 Mg Tabs (Folic acid-vit b6-vit b12) ..... One by mouth dialy  Hgb: 10.6 (11/29/2009)   Hct: 32.1 (11/29/2009)   Platelets: 254.0 (11/29/2009) RBC: 3.58 (11/29/2009)   RDW: 18.1 (11/29/2009)   WBC: 5.2 (11/29/2009) MCV: 89.7 (11/29/2009)   MCHC: 32.9 (11/29/2009) Iron: 73 (11/29/2009)   % Sat: 12.4 (11/29/2009) B12: 212 (11/29/2009)   Folate: 12.0 (11/29/2009)   TSH: 1.80 (11/16/2009)  Problem # 3:  ABNORMAL TRANSAMINASE-LFT'S (ICD-790.4) moniter  Problem # 4:  MITRAL VALVE REPLACEMENT, HX OF (ICD-V15.1)  Orders: Protime (13086VH) Fingerstick (84696)  Complete Medication List: 1)  Coumadin 4 Mg Tabs (Warfarin sodium) .... Held take 4 mg by mouth as directed 2)  Maxalt-mlt 10 Mg Tbdp (Rizatriptan benzoate) .... Take 1 tablet by mouth 3)  Ortho-novum 1/35 (28) 1-35 Mg-mcg Tabs (Norethindrone-eth estradiol) .... Once daily 4)  Spironolactone 50 Mg Tabs (Spironolactone) .Marland Kitchen.. 1 once daily 5)  Cymbalta 30 Mg Cpep (Duloxetine hcl) .Marland Kitchen.. 1 once daily 6)  Hydrochlorothiazide 50 Mg Tabs (Hydrochlorothiazide) .Marland Kitchen.. 1 once daily 7)  Folbee 2.5-25-1 Mg Tabs (Folic acid-vit b6-vit b12) .... One by mouth dialy 8)  Furosemide 40 Mg Tabs (Furosemide) .Marland Kitchen.. 1 once daily 9)  Bisoprolol Fumarate 5 Mg Tabs (Bisoprolol fumarate) .... 1/2 by mouth daily  Patient Instructions: 1)  Please schedule a follow-up appointment as needed. Prescriptions: BISOPROLOL FUMARATE 5 MG TABS (BISOPROLOL FUMARATE) 1/2 by mouth daily  #30 x 4   Entered and Authorized by:   Stacie Glaze MD   Signed by:   Stacie Glaze MD on 12/13/2009   Method used:   Electronically to        [facility]. [phone]* (retail)       [address]       [place], [place]  [address]       Ph: [phone]       Fax: [phone]   RxID:    [id number]   Laboratory Results   Blood Tests   Date/Time Recieved: Dec 13, 2009 5:06 PM  Date/Time Reported: Dec 13, 2009 5:06 PM   PT: 19.6 s   (Normal Range: 10.6-13.4)  INR: 2.6   (Normal Range: 0.88-1.12   Therap INR: 2.0-3.5) Comments: Wynona Canes, CMA  Dec 13, 2009 5:07 PM       ANTICOAGULATION RECORD PREVIOUS REGIMEN & LAB RESULTS Anticoagulation Diagnosis:  v43.3,v15.1,v58.61 on  02/11/2007 Previous INR Goal Range:  2.5-3.5 on  02/11/2007 Previous INR:  2.1 on  12/10/2009 Previous Coumadin Dose(mg):  4mg ,2mg  altinate on  07/28/2008 Previous Regimen:  8mg . today only then resume on  12/10/2009 Previous Coagulation Comments:  Dr. Kirtland Bouchard approved on  12/10/2009  NEW REGIMEN & LAB RESULTS Current INR: 2.6 Current Coumadin Dose(mg): 4mg  qd Regimen: 8mg . today only then resume  (no change)       Repeat testing in: 4 weeks MEDICATIONS COUMADIN 4 MG TABS (WARFARIN SODIUM) HELD Take  4 mg by mouth as directed MAXALT-MLT 10 MG TBDP (RIZATRIPTAN BENZOATE) Take 1 tablet by mouth ORTHO-NOVUM 1/35 (28) 1-35 MG-MCG TABS (NORETHINDRONE-ETH ESTRADIOL) once daily SPIRONOLACTONE 50 MG TABS (SPIRONOLACTONE) 1 once daily CYMBALTA 30 MG CPEP (DULOXETINE HCL) 1 once daily HYDROCHLOROTHIAZIDE 50 MG TABS (HYDROCHLOROTHIAZIDE) 1 once daily FOLBEE 2.5-25-1 MG TABS (FOLIC ACID-VIT B6-VIT B12) one by mouth dialy FUROSEMIDE 40 MG TABS (FUROSEMIDE) 1 once daily BISOPROLOL FUMARATE 5 MG TABS (BISOPROLOL FUMARATE) 1/2 by mouth daily

## 2010-08-23 NOTE — Progress Notes (Signed)
Summary: discuss pulmonic htn  Phone Note Call from Patient Call back at Home Phone [phone] Call back at Work Phone [phone]   Caller: Patient Reason for Call: Talk to Nurse Summary of Call: pt spoke with dr. Tonie Griffith , he wanted to discuss  pulmonic htn.  Initial call taken by: Lorne Skeens,  April 25, 2010 3:18 PM  Follow-up for Phone Call        SPoke with patient .  Echo on Tuesday witll help evaluate. Follow-up by: Sherrill Raring, MD, [facility],  April 26, 2010 6:29 AM

## 2010-08-23 NOTE — Progress Notes (Signed)
Summary: labs  Phone Note Call from Patient   Caller: Patient Call For: Stacie Glaze MD Reason for Call: Acute Illness, Refill Medication Summary of Call: Dr. [name] did NOT order labs, and she sees Dr. Lovell Sheehan on Wed. What labs does he want>?  [phone] Initial call taken by: Lynann Beaver CMA,  March 07, 2010 9:31 AM  Follow-up for Phone Call        per dr Angelena Sole and renal panel before appointment on 8-17 Follow-up by: Willy Eddy, LPN,  March 07, 2010 9:45 AM  Additional Follow-up for Phone Call Additional follow up Details #1::        Notified pt and labs ordered. Additional Follow-up by: Lynann Beaver CMA,  March 07, 2010 9:53 AM

## 2010-08-23 NOTE — Letter (Signed)
Summary: [employer]   Imported By: Marylou Mccoy 05/04/2010 11:06:16  _____________________________________________________________________  External Attachment:    Type:   Image     Comment:   External Document

## 2010-08-23 NOTE — Assessment & Plan Note (Signed)
Summary: PT/CJR  Nurse Visit   Allergies: No Known Drug Allergies Laboratory Results   Blood Tests      INR: 2.1   (Normal Range: 0.88-1.12   Therap INR: 2.0-3.5) Comments: Rita Ohara  Dec 10, 2009 1:39 PM     Orders Added: 1)  Est. Patient Level I [99211] 2)  Protime [[id number]]   ANTICOAGULATION RECORD PREVIOUS REGIMEN & LAB RESULTS Anticoagulation Diagnosis:  v43.3,v15.1,v58.61 on  02/11/2007 Previous INR Goal Range:  2.5-3.5 on  02/11/2007 Previous INR:  2.8 ratio on  11/16/2009 Previous Coumadin Dose(mg):  4mg ,2mg  altinate on  07/28/2008 Previous Regimen:  8mg . just today then resume 4mg . QD on  09/28/2009 Previous Coagulation Comments:  Hold for 3 days on  07/13/2008  NEW REGIMEN & LAB RESULTS Current INR: 2.1 Regimen: 8mg . today only then resume Coagulation Comments: Dr. Kirtland Bouchard approved Repeat testing in: Monday  Anticoagulation Visit Questionnaire Coumadin dose missed/changed:  No Abnormal Bleeding Symptoms:  No  Any diet changes including alcohol intake, vegetables or greens since the last visit:  No Any illnesses or hospitalizations since the last visit:  No Any signs of clotting since the last visit (including chest discomfort, dizziness, shortness of breath, arm tingling, slurred speech, swelling or redness in leg):  No  MEDICATIONS COUMADIN 4 MG TABS (WARFARIN SODIUM) HELD Take 4 mg by mouth as directed MAXALT-MLT 10 MG TBDP (RIZATRIPTAN BENZOATE) Take 1 tablet by mouth ORTHO-NOVUM 1/35 (28) 1-35 MG-MCG TABS (NORETHINDRONE-ETH ESTRADIOL) once daily SPIRONOLACTONE 50 MG TABS (SPIRONOLACTONE) 1 once daily CYMBALTA 30 MG CPEP (DULOXETINE HCL) 1 once daily HYDROCHLOROTHIAZIDE 50 MG TABS (HYDROCHLOROTHIAZIDE) 1 once daily FOLBEE 2.5-25-1 MG TABS (FOLIC ACID-VIT B6-VIT B12) one by mouth dialy

## 2010-08-23 NOTE — Miscellaneous (Signed)
Summary: Appointment Canceled  Appointment status changed to canceled by [employer] on 04/22/2010 10:33 AM.  Cancellation Comments --------------------- ECHO/V12.59/UCH/NO PREC. REQ/SAF  Appointment Information ----------------------- Appt Type:  CARDIOLOGY ANCILLARY VISIT      Date:  Friday, April 22, 2010      Time:  8:30 AM for 60 min   Urgency:  Routine   Made By:  Hoy Finlay Scheduler  To Visit:  [id number]    Reason:  ECHO/V12.59/UCH/NO PREC. REQ/SAF  Appt Comments ------------- -- 04/22/10 10:33: (CEM[facility]) CANCELED -- ECHO/V12.59/UCH/NO PREC. REQ/SAF -- 04/22/10 8:33: (CEMR) ARRIVED -- ECHO/V12.59/UCH/NO PREC. REQ/SAF -- 04/18/10 10:03: (CEM[facility]) BOOKED -- Routine CARDIOLOGY ANCILLARY VISIT at 04/22/2010 8:30 AM for 60 min ECHO/V12

## 2010-08-23 NOTE — Letter (Signed)
Summary: Generic Letter  Architectural technologist, Main Office  [address]   [place], [place] [address]   Phone: [phone]  Fax: [phone]       03/29/2010        To Whom It May Concern:  Jessica [name] (DOB [birth year]) is a patient I follow in Cardiology Clinic.  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  She has both mitral and aortic valve prostheses.   [name] was seen by Dr. Darryll Capers recently.  She complained of tachypalpitations.  She has had these in the past.  She is at risk for both atrial and ventricular tachyarrhythmias.  A recent holter monitor was did not show significant arrhythmia but she had few spells.  With her cardiac problems it is important to catch an arrhthymia if it is occurring.  I am writing to get approval for her to wear an event monitor.  If you have any questions I would like to talk to you.  Please contact me at [phone].  Sincerely,    Pricilla Riffle, M.D., F.A.C.C.  Appended Document: Generic Letter 03/30/10----900am--dr ross--i put order in for event monitor--per charmiane, once order is in, if insurance denies, it will then go to her to get approval--that's where your letter will come in, and she's aware of it--now we wait to hear from insurance co--nt

## 2010-08-23 NOTE — Progress Notes (Signed)
  Phone Note Call from Patient   Caller: Patient Call For: Stacie Glaze MD Summary of Call: Pt had abdnormal labs in [place] 2 weeks ago after abdomainal injury.  AST and hgb were abdnormal.  Per Dr. Lovell Sheehan, can have Liver, CBC and INR ASAP.  Let him know if she has an urgent matter to be seen right away. [phone] Initial call taken by: Lynann Beaver CMA,  January 19, 2010 9:45 AM  Follow-up for Phone Call        [facility] Follow-up by: Lynann Beaver CMA,  January 19, 2010 9:45 AM  Additional Follow-up for Phone Call Additional follow up Details #1::        Pt is sch for labs 01-19-2010 10.30am Additional Follow-up by: Heron Sabins,  January 19, 2010 10:03 AM    Additional Follow-up for Phone Call Additional follow up Details #2::    ov this afternoon  Follow-up by: Willy Eddy, LPN,  January 19, 2010 10:51 AM

## 2010-08-23 NOTE — Assessment & Plan Note (Signed)
Summary: increased fatigue/sob   Allergies: No Known Drug Allergies  Appended Document: increased fatigue/sob Patient did not stay for appt.

## 2010-08-23 NOTE — Letter (Signed)
Summary: [facility]  [facility]   Imported By: Maryln Gottron 09/28/2009 13:30:31  _____________________________________________________________________  External Attachment:    Type:   Image     Comment:   External Document

## 2010-08-23 NOTE — Progress Notes (Signed)
Summary: meds and test results  Phone Note Call from Patient   Caller: Patient Call For: Stacie Glaze MD Reason for Call: Acute Illness Summary of Call: [phone] pt is asking about results of stool studies.  [place] and needs written prescription for all of her medications.  States that Dr.Ponce Skillman is aware of this. Initial call taken by: Lynann Beaver CMA,  Dec 14, 2009 2:28 PM  Follow-up for Phone Call        just returned this am- not back yet- scripts ready for pick up Follow-up by: Willy Eddy, LPN,  Dec 14, 2009 2:51 PM

## 2010-08-25 NOTE — Assessment & Plan Note (Signed)
Summary: COUGH, CONGESTION // RS   Vital Signs:  Patient profile:   35 year old female Weight:      144 pounds O2 Sat:      93 % on Room air Temp:     99.1 degrees F oral Pulse rate:   70 / minute BP sitting:   114 / 60  (left arm) Cuff size:   regular  Vitals Entered By: Romualdo Bolk, CMA (AAMA) (July 06, 2010 8:59 AM)  O2 Flow:  Room air CC: Coughing, runny nose, head and chest congestion, sore throat x 1 week.   Primary Care Marlon Vonruden:  Darryll Capers, MD  CC:  Coughing, runny nose, head and chest congestion, and sore throat x 1 week.Marland Kitchen  History of Present Illness: Patient presents to clinic as a workin for evaluation of URI symptoms.  Notes 7d h/o nasal congesion/drainage and cough productive for brown sputum. Denies f/c, hemoptysis or dyspnea.  Notes multiple possible sick exposures at work. Symptoms have worsened and has associated poor sleep at night. 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   Preventive Screening-Counseling & Management  Alcohol-Tobacco     Smoking Status: never     Tobacco Counseling: not indicated; no tobacco use  Caffeine-Diet-Exercise     Does Patient Exercise: yes  Current Medications (verified): 1)  Coumadin 4 Mg Tabs (Warfarin Sodium) .... Take 4 Mg By Mouth As Directed 2)  Ortho-Novum 1/35 (28) 1-35 Mg-Mcg Tabs (Norethindrone-Eth Estradiol) .... Once Daily 3)  Spironolactone 50 Mg Tabs (Spironolactone) .Marland Kitchen.. 1 Time Every Other Day 4)  Cymbalta 30 Mg Cpep (Duloxetine Hcl) .Marland Kitchen.. 1 Once Daily 5)  Hydrochlorothiazide 50 Mg Tabs (Hydrochlorothiazide) .Marland Kitchen.. 1 Every Other Day 6)  Furosemide 40 Mg Tabs (Furosemide) .Marland Kitchen.. 1 Once Daily 7)  Bisoprolol Fumarate 5 Mg Tabs (Bisoprolol Fumarate) .... 1.5 Once Daily 8)  Ferrous Sulfate 325 (65 Fe) Mg Tabs (Ferrous Sulfate) .... (Otc) 1 Tab Once Daily 9)  Multi Vitamin .... Take One Tablet By Mouth  Daily  Allergies (verified): 1)  Betadine  Review of Systems      See HPI  Physical Exam  General:  Well-developed,well-nourished,in no acute distress; alert,appropriate and cooperative throughout examination Head:  Normocephalic and atraumatic without obvious abnormalities. No apparent alopecia or balding. Eyes:  corneas and lenses clear and no injection.   Ears:  External ear exam shows no significant lesions or deformities.  Otoscopic examination reveals clear canals, tympanic membranes are intact bilaterally without bulging, retraction, inflammation or discharge. Hearing is grossly normal bilaterally. Nose:  External nasal examination shows no deformity or inflammation. Nasal mucosa are pink and moist without lesions or exudates. Mouth:  Oral mucosa and oropharynx without lesions or exudates.  Teeth in good repair. Neck:  No deformities, masses, or tenderness noted. Lungs:  Normal respiratory effort, chest expands symmetrically. Lungs are clear to auscultation, no crackles or wheezes. Heart:  normal rate and regular rhythm.  +valve click. 4/6 systolic murmur best heard rsb. no rub or gallop Skin:  turgor normal, color normal, and no rashes.     Impression & Recommendations:  Problem # 1:  ACUTE BRONCHITIS (ICD-466.0) Assessment New Discussed despite spontaneous improvement may represent underlying viral infxn. Begin tussionex as needed cough and cautioned regarding possible sedating effect.  Abx given to hold for at least 48 hours and may begin if no improvement at that point. If does begin abx instructed to immediately contact coumadin  clinic for possible accelerated inr monitoring. Followup closely if no improvement or worsening.  Her updated medication list for this problem includes:    Amoxicillin 875 Mg Tabs (Amoxicillin) ..... One by mouth bid    Tussionex Pennkinetic Er 10-8 Mg/37ml Lqcr (Hydrocod polst-chlorphen polst) .Marland KitchenMarland KitchenMarland KitchenMarland Kitchen 5ml by mouth q12 hours as needed cough  Complete  Medication List: 1)  Coumadin 4 Mg Tabs (Warfarin sodium) .... Take 4 mg by mouth as directed 2)  Ortho-novum 1/35 (28) 1-35 Mg-mcg Tabs (Norethindrone-eth estradiol) .... Once daily 3)  Spironolactone 50 Mg Tabs (Spironolactone) .Marland Kitchen.. 1 time every other day 4)  Cymbalta 30 Mg Cpep (Duloxetine hcl) .Marland Kitchen.. 1 once daily 5)  Hydrochlorothiazide 50 Mg Tabs (Hydrochlorothiazide) .Marland Kitchen.. 1 every other day 6)  Furosemide 40 Mg Tabs (Furosemide) .Marland Kitchen.. 1 once daily 7)  Bisoprolol Fumarate 5 Mg Tabs (Bisoprolol fumarate) .... 1.5 once daily 8)  Ferrous Sulfate 325 (65 Fe) Mg Tabs (Ferrous sulfate) .... (otc) 1 tab once daily 9)  Multi Vitamin  .... Take one tablet by mouth daily 10)  Amoxicillin 875 Mg Tabs (Amoxicillin) .... One by mouth bid 11)  Tussionex Pennkinetic Er 10-8 Mg/30ml Lqcr (Hydrocod polst-chlorphen polst) .... 5ml by mouth q12 hours as needed cough  Patient Instructions: 1)  Acute bronchitis symptoms for less than 10 days are not helped by antibiotics. If you do not improve within that period of time please take your antibiotic prescription.  Pleae notify the coumadin clinic immediately if you do begin the antibiotics. Followup if no improvement or worsening. Prescriptions: TUSSIONEX PENNKINETIC ER 10-8 MG/5ML LQCR (HYDROCOD POLST-CHLORPHEN POLST) 5ml by mouth q12 hours as needed cough  #4 ounces x 0   Entered and Authorized by:   Edwyna Perfect MD   Signed by:   Edwyna Perfect MD on 07/06/2010   Method used:   Print then Give to Patient   RxID:   [id number] AMOXICILLIN 875 MG TABS (AMOXICILLIN) one by mouth bid  #14 x 0   Entered and Authorized by:   Edwyna Perfect MD   Signed by:   Edwyna Perfect MD on 07/06/2010   Method used:   Print then Give to Patient   RxID:   [id number]

## 2010-08-25 NOTE — Progress Notes (Signed)
Summary: pt wants test results  Phone Note Call from Patient Call back at Work Phone [phone]   Caller: Patient Reason for Call: Talk to Nurse, Talk to Doctor Summary of Call: pt wants test results and wants to have them sent to her PCP in [place] Initial call taken by: Omer Jack,  August 12, 2010 12:48 PM  Follow-up for Phone Call        Called patient with lab results. Advised will fax labs to Dr. Vladimir Faster in [place] at [phone].          Layne Benton, RN, BSN  August 12, 2010 2:34 PM

## 2010-08-25 NOTE — Miscellaneous (Signed)
Summary: Orders Update   Clinical Lists Changes  Orders: Added new Service order of Est. Patient Level IV (99214) - Signed 

## 2010-08-25 NOTE — Assessment & Plan Note (Signed)
Summary: fu eyes/?med/ppd test/njr   Vital Signs:  Patient profile:   35 year old female Weight:      143 pounds O2 Sat:      91 % Temp:     98.4 degrees F Pulse rate:   68 / minute BP sitting:   110 / 74  (left arm)  Vitals Entered By: Pura Spice, RN (July 11, 2010 1:27 PM) CC: reck rt eye    Primary Care Provider:  Darryll Capers, MD  CC:  reck rt eye .  History of Present Illness: patient was colored as a work in for evaluation of red eyes. Noticed 3 to 4 day history of initial left redness progressive  to right eye redness.  no history of injury or trauma and no change in visual acuity.  Has noted  slightly yellow mucopurulent discharge from both eyes denies fever or chills.  No obvious sick exposures.  Seen recently for a upper respiratory infection which failed to improve with conservative treatment.  Did begin p.o. antibiotic and is having her PT/INR checked today. no complaints.  Allergies: 1)  Betadine  Review of Systems      See HPI  Physical Exam  General:  Well-developed,well-nourished,in no acute distress; alert,appropriate and cooperative throughout examination Head:  Normocephalic and atraumatic without obvious abnormalities. No apparent alopecia or balding. Eyes:  vision grossly intact, pupils equal, and pupils round. diffuse mild right conjunctiva with injection and erythema. Mild medial area of erythema on the left.    Impression & Recommendations:  Problem # 1:  CONJUNCTIVITIS, ACUTE (ICD-372.00) With mucopurulent discharge begin antibiotic drops. polyp with no improvement or worsening.  Strict hand washing and contact precautions recommended. Her updated medication list for this problem includes:    Polytrim 10000-0.1 Unit/ml-% Soln (Polymyxin b-trimethoprim) .Marland Kitchen... 1 gtt to each eye q4 hours x 7days  Complete Medication List: 1)  Coumadin 4 Mg Tabs (Warfarin sodium) .... Take 4 mg by mouth as directed 2)  Ortho-novum 1/35 (28) 1-35 Mg-mcg Tabs  (Norethindrone-eth estradiol) .... Once daily 3)  Spironolactone 50 Mg Tabs (Spironolactone) .Marland Kitchen.. 1 time every other day 4)  Cymbalta 30 Mg Cpep (Duloxetine hcl) .Marland Kitchen.. 1 once daily 5)  Hydrochlorothiazide 50 Mg Tabs (Hydrochlorothiazide) .Marland Kitchen.. 1 every other day 6)  Furosemide 40 Mg Tabs (Furosemide) .Marland Kitchen.. 1 once daily 7)  Bisoprolol Fumarate 5 Mg Tabs (Bisoprolol fumarate) .... 1.5 once daily 8)  Ferrous Sulfate 325 (65 Fe) Mg Tabs (Ferrous sulfate) .... (otc) 1 tab once daily 9)  Multi Vitamin  .... Take one tablet by mouth daily 10)  Amoxicillin 875 Mg Tabs (Amoxicillin) .... One by mouth bid 11)  Tussionex Pennkinetic Er 10-8 Mg/60ml Lqcr (Hydrocod polst-chlorphen polst) .... 5ml by mouth q12 hours as needed cough 12)  Polytrim 10000-0.1 Unit/ml-% Soln (Polymyxin b-trimethoprim) .Marland Kitchen.. 1 gtt to each eye q4 hours x 7days  Other Orders: Protime ([id number]) Fingerstick (45409) Prescriptions: POLYTRIM 10000-0.1 UNIT/ML-% SOLN (POLYMYXIN B-TRIMETHOPRIM) 1 gtt to each eye q4 hours x 7days  #1 bottle x 0   Entered and Authorized by:   Edwyna Perfect MD   Signed by:   Edwyna Perfect MD on 07/11/2010   Method used:   Electronically to        [facility]. [phone]* (retail)       [address]       [place], [place]  [address]  Ph: [phone]       Fax: [phone]   RxID:   [id number]    Orders Added: 1)  Protime [[id number]] 2)  Fingerstick [36416] 3)  Est. Patient Level IV [62952]     ANTICOAGULATION RECORD PREVIOUS REGIMEN & LAB RESULTS Anticoagulation Diagnosis:  v43.3,v15.1,v58.61 on  02/11/2007 Previous INR Goal Range:  2.5-3.5 on  02/11/2007 Previous INR:  2.2 ratio on  04/18/2010 Previous Coumadin Dose(mg):  4mg  qd on  12/13/2009 Previous Regimen:  2mg . today only then resume on  03/07/2010 Previous Coagulation Comments:  Dr. Kirtland Bouchard approved on  12/10/2009  NEW REGIMEN & LAB RESULTS Current INR: 4.3 Regimen: hold 1 day then take 2mg . ,  4mg ., 4mg . ALT Coagulation Comments: Dr. Lovell Sheehan approved. Repeat testing in: 4 weeks  Anticoagulation Visit Questionnaire Coumadin dose missed/changed:  No Abnormal Bleeding Symptoms:  No  Any diet changes including alcohol intake, vegetables or greens since the last visit:  No Any illnesses or hospitalizations since the last visit:  No Any signs of clotting since the last visit (including chest discomfort, dizziness, shortness of breath, arm tingling, slurred speech, swelling or redness in leg):  No  MEDICATIONS COUMADIN 4 MG TABS (WARFARIN SODIUM) Take 4 mg by mouth as directed ORTHO-NOVUM 1/35 (28) 1-35 MG-MCG TABS (NORETHINDRONE-ETH ESTRADIOL) once daily SPIRONOLACTONE 50 MG TABS (SPIRONOLACTONE) 1 time every other day CYMBALTA 30 MG CPEP (DULOXETINE HCL) 1 once daily HYDROCHLOROTHIAZIDE 50 MG TABS (HYDROCHLOROTHIAZIDE) 1 every other day FUROSEMIDE 40 MG TABS (FUROSEMIDE) 1 once daily BISOPROLOL FUMARATE 5 MG TABS (BISOPROLOL FUMARATE) 1.5 once daily FERROUS SULFATE 325 (65 FE) MG TABS (FERROUS SULFATE) (OTC) 1 tab once daily * MULTI VITAMIN Take one tablet by mouth daily AMOXICILLIN 875 MG TABS (AMOXICILLIN) one by mouth bid TUSSIONEX PENNKINETIC ER 10-8 MG/5ML LQCR (HYDROCOD POLST-CHLORPHEN POLST) 5ml by mouth q12 hours as needed cough POLYTRIM 10000-0.1 UNIT/ML-% SOLN (POLYMYXIN B-TRIMETHOPRIM) 1 gtt to each eye q4 hours x 7days    Laboratory Results   Blood Tests      INR: 4.3   (Normal Range: 0.88-1.12   Therap INR: 2.0-3.5) Comments: Rita Ohara  July 11, 2010 1:56 PM

## 2010-08-25 NOTE — Assessment & Plan Note (Signed)
Summary: pt/cjr  Nurse Visit   Allergies: 1)  Betadine Laboratory Results   Blood Tests      INR: 2.1   (Normal Range: 0.88-1.12   Therap INR: 2.0-3.5) Comments: Rita Ohara  July 27, 2010 3:19 PM     Orders Added: 1)  Est. Patient Level I [99211] 2)  Protime [[id number]]   ANTICOAGULATION RECORD PREVIOUS REGIMEN & LAB RESULTS Anticoagulation Diagnosis:  v43.3,v15.1,v58.61 on  02/11/2007 Previous INR Goal Range:  2.5-3.5 on  02/11/2007 Previous INR:  4.3 on  07/11/2010 Previous Coumadin Dose(mg):  4mg  qd on  12/13/2009 Previous Regimen:  hold 1 day then take 2mg . , 4mg ., 4mg . ALT on  07/11/2010 Previous Coagulation Comments:  Dr. Lovell Sheehan approved. on  07/11/2010  NEW REGIMEN & LAB RESULTS Current INR: 2.1 Regimen: hold 1 day then take 2mg . , 4mg ., 4mg . ALT  (no change) Coagulation Comments: Patient will be back in on 08-01-10 to recheck before procedure. Repeat testing in: 08-01-10  Anticoagulation Visit Questionnaire Coumadin dose missed/changed:  No Abnormal Bleeding Symptoms:  No  Any diet changes including alcohol intake, vegetables or greens since the last visit:  No Any illnesses or hospitalizations since the last visit:  No Any signs of clotting since the last visit (including chest discomfort, dizziness, shortness of breath, arm tingling, slurred speech, swelling or redness in leg):  No  MEDICATIONS COUMADIN 4 MG TABS (WARFARIN SODIUM) Take 4 mg by mouth as directed ORTHO-NOVUM 1/35 (28) 1-35 MG-MCG TABS (NORETHINDRONE-ETH ESTRADIOL) once daily SPIRONOLACTONE 50 MG TABS (SPIRONOLACTONE) 1 time every other day CYMBALTA 30 MG CPEP (DULOXETINE HCL) 1 once daily HYDROCHLOROTHIAZIDE 50 MG TABS (HYDROCHLOROTHIAZIDE) 1 every other day FUROSEMIDE 40 MG TABS (FUROSEMIDE) 1 once daily BISOPROLOL FUMARATE 5 MG TABS (BISOPROLOL FUMARATE) 1.5 once daily FERROUS SULFATE 325 (65 FE) MG TABS (FERROUS SULFATE) (OTC) 1 tab once daily * MULTI VITAMIN Take one tablet by  mouth daily

## 2010-08-25 NOTE — Assessment & Plan Note (Signed)
Summary: pt is on coumadin/sty/njr   Vital Signs:  Patient profile:   35 year old female Weight:      146 pounds Temp:     99.1 degrees F oral BP sitting:   114 / 76  (left arm) Cuff size:   regular  Vitals Entered By: Alfred Levins, CMA (July 19, 2010 11:45 AM) CC: stye on rt eye   Primary Care Provider:  Darryll Capers, MD  CC:  stye on rt eye.  History of Present Illness:  patient reports a history of and irritated I for several days. she noted some bruising around her left eye yesterday. she now notes a scleral hemorrhage. She denies any pain. She denies any visual loss. no other specific complaints.  Current Medications (verified): 1)  Coumadin 4 Mg Tabs (Warfarin Sodium) .... Take 4 Mg By Mouth As Directed 2)  Ortho-Novum 1/35 (28) 1-35 Mg-Mcg Tabs (Norethindrone-Eth Estradiol) .... Once Daily 3)  Spironolactone 50 Mg Tabs (Spironolactone) .Marland Kitchen.. 1 Time Every Other Day 4)  Cymbalta 30 Mg Cpep (Duloxetine Hcl) .Marland Kitchen.. 1 Once Daily 5)  Hydrochlorothiazide 50 Mg Tabs (Hydrochlorothiazide) .Marland Kitchen.. 1 Every Other Day 6)  Furosemide 40 Mg Tabs (Furosemide) .Marland Kitchen.. 1 Once Daily 7)  Bisoprolol Fumarate 5 Mg Tabs (Bisoprolol Fumarate) .... 1.5 Once Daily 8)  Ferrous Sulfate 325 (65 Fe) Mg Tabs (Ferrous Sulfate) .... (Otc) 1 Tab Once Daily 9)  Multi Vitamin .... 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 Occupation: Engineer, civil (consulting), [employer]  Risk Factors: Exercise: yes (07/06/2010)  Risk Factors: Smoking Status: never (07/06/2010)  Physical Exam  General:   well-developed female no acute distress. HEENT exam: she has a periorbital ecchymosis around her left eye. She has a scleral hemorrhage of her left eye. No pain to palpation of her left eye. vision is grossly normal.   Impression & Recommendations:  Problem # 1:  CONJUNCTIVAL HEMORRHAGE (ICD-372.72)  she has a scleral hemorrhage. I think the periorbital ecchymosis is related. This is exacerbated by her Coumadin use. her INR has been in target range. I'll think any further evaluation is necessary. she will call if she develops any pain, visual loss or any other specific concerns.  Complete Medication List: 1)  Coumadin 4 Mg Tabs (Warfarin sodium) .... Take 4 mg by mouth as directed 2)  Ortho-novum 1/35 (28) 1-35 Mg-mcg Tabs (Norethindrone-eth estradiol) .... Once daily 3)  Spironolactone 50 Mg Tabs (Spironolactone) .Marland Kitchen.. 1 time every other day 4)  Cymbalta 30 Mg Cpep (Duloxetine hcl) .Marland Kitchen.. 1 once daily 5)  Hydrochlorothiazide 50 Mg Tabs (Hydrochlorothiazide) .Marland Kitchen.. 1 every other day 6)  Furosemide 40 Mg Tabs (Furosemide) .Marland Kitchen.. 1 once daily 7)  Bisoprolol Fumarate 5 Mg Tabs (Bisoprolol fumarate) .... 1.5 once daily 8)  Ferrous Sulfate 325 (65 Fe) Mg Tabs (Ferrous sulfate) .... (otc) 1 tab  once daily 9)  Multi Vitamin  .... Take one tablet by mouth daily  Other Orders: Protime ([id number])   Orders Added: 1)  Est. Patient Level I [45409] 2)  Protime [81191YN] 3)  Est. Patient Level III [82956]     ANTICOAGULATION RECORD PREVIOUS REGIMEN & LAB RESULTS Anticoagulation Diagnosis:  v43.3,v15.1,v58.61 on   02/11/2007 Previous INR Goal Range:  2.5-3.5 on  02/11/2007 Previous INR:  4.3 on  07/11/2010 Previous Coumadin Dose(mg):  4mg  qd on  12/13/2009 Previous Regimen:  hold 1 day then take 2mg . , 4mg ., 4mg . ALT on  07/11/2010 Previous Coagulation Comments:  Dr. Lovell Sheehan approved. on  07/11/2010  NEW REGIMEN & LAB RESULTS Current INR: 1.5 Regimen: hold 1 day then take 2mg . , 4mg ., 4mg . ALT  (no change)   Anticoagulation Visit Questionnaire Coumadin dose missed/changed:  Yes Coumadin Dose Comments:  one or more missed dose(s) Abnormal Bleeding Symptoms:  No  Any diet changes including alcohol intake, vegetables or greens since the last visit:  No Any illnesses or hospitalizations since the last visit:  No Any signs of clotting since the last visit (including chest discomfort, dizziness, shortness of breath, arm tingling, slurred speech, swelling or redness in leg):  No  MEDICATIONS COUMADIN 4 MG TABS (WARFARIN SODIUM) Take 4 mg by mouth as directed ORTHO-NOVUM 1/35 (28) 1-35 MG-MCG TABS (NORETHINDRONE-ETH ESTRADIOL) once daily SPIRONOLACTONE 50 MG TABS (SPIRONOLACTONE) 1 time every other day CYMBALTA 30 MG CPEP (DULOXETINE HCL) 1 once daily HYDROCHLOROTHIAZIDE 50 MG TABS (HYDROCHLOROTHIAZIDE) 1 every other day FUROSEMIDE 40 MG TABS (FUROSEMIDE) 1 once daily BISOPROLOL FUMARATE 5 MG TABS (BISOPROLOL FUMARATE) 1.5 once daily FERROUS SULFATE 325 (65 FE) MG TABS (FERROUS SULFATE) (OTC) 1 tab once daily * MULTI VITAMIN Take one tablet by mouth daily    Laboratory Results   Blood Tests      INR: 1.5   (Normal Range: 0.88-1.12   Therap INR: 2.0-3.5) Comments: Rita Ohara  July 19, 2010 12:12 PM

## 2010-08-25 NOTE — Progress Notes (Signed)
Summary: calling about procedule and having labs drawn  Phone Note Call from Patient Call back at [facility] Phone [phone] Call back at Work Phone [phone]   Caller: Patient Summary of Call: Pt calling eregarding  her procedule, and pt want to have her labs drawn BNP,Digoxin level, Initial call taken by: Judie Grieve,  August 05, 2010 8:53 AM  Follow-up for Phone Call        lmfcb Claris Gladden, RN, BSN 08/05/10 0900 pt to come in on 1/19 at 0830 for labs. pt explained she had an unsuccessful ablation in [place] and recently started Digoxin. Labs are for MD in [place].  Follow-up by: Claris Gladden RN,  August 05, 2010 9:19 AM

## 2010-08-31 NOTE — Progress Notes (Signed)
Summary: Walk in Form With Patient Concern  Walk in Form With Patient Concern   Imported By: Roderic Ovens 08/23/2010 16:16:48  _____________________________________________________________________  External Attachment:    Type:   Image     Comment:   External Document

## 2010-09-02 ENCOUNTER — Telehealth: Payer: Self-pay | Admitting: Internal Medicine

## 2010-09-04 ENCOUNTER — Other Ambulatory Visit: Payer: Self-pay | Admitting: Internal Medicine

## 2010-09-07 ENCOUNTER — Encounter (INDEPENDENT_AMBULATORY_CARE_PROVIDER_SITE_OTHER): Payer: Self-pay | Admitting: *Deleted

## 2010-09-07 ENCOUNTER — Other Ambulatory Visit: Payer: Self-pay

## 2010-09-07 ENCOUNTER — Encounter: Payer: Self-pay | Admitting: Internal Medicine

## 2010-09-07 ENCOUNTER — Other Ambulatory Visit: Payer: Self-pay | Admitting: Internal Medicine

## 2010-09-07 DIAGNOSIS — Z8679 Personal history of other diseases of the circulatory system: Secondary | ICD-10-CM

## 2010-09-07 LAB — HEPATIC FUNCTION PANEL
ALT: 28 U/L (ref 0–35)
AST: 66 U/L — ABNORMAL HIGH (ref 0–37)
Albumin: 4 g/dL (ref 3.5–5.2)

## 2010-09-07 LAB — PROTIME-INR: Prothrombin Time: 33.6 s — ABNORMAL HIGH (ref 10.2–12.4)

## 2010-09-11 LAB — CONVERTED CEMR LAB
Digitoxin Lvl: 1 ng/mL (ref 0.8–2.0)
Hemoglobin: 12.6 g/dL (ref 12.0–15.0)
LDH: 900 units/L — ABNORMAL HIGH (ref 94–250)
MCHC: 32.7 g/dL (ref 30.0–36.0)
RDW: 15.7 % — ABNORMAL HIGH (ref 11.5–15.5)

## 2010-09-14 NOTE — Progress Notes (Signed)
Summary: pt req order for lab work  Phone Note Call from Patient   Caller: Patient [phone] Summary of Call: pt req order for labs  lbh, smear,cbc,inr Initial call taken by: Glynda Jaeger,  September 02, 2010 3:01 PM  Follow-up for Phone Call        Called patient back. She is being followed by the cardiology clinic at [facility] and they are requesting the following labs: dig level,cbc,INR,hepatic,and LDH smear. Advised patient that I will speak with the [facility].Lab to find out if they perform the LDH smear test and call her back on 2/13.  .sigh      Appended Document: pt req order for lab work Called patient back. Lab work ordered for 2/15 at [facility] and to be sent to [facility].

## 2010-09-15 ENCOUNTER — Telehealth: Payer: Self-pay | Admitting: Internal Medicine

## 2010-09-16 ENCOUNTER — Other Ambulatory Visit: Payer: BC Managed Care – PPO

## 2010-09-16 ENCOUNTER — Encounter (INDEPENDENT_AMBULATORY_CARE_PROVIDER_SITE_OTHER): Payer: Self-pay | Admitting: *Deleted

## 2010-09-16 ENCOUNTER — Other Ambulatory Visit: Payer: Self-pay | Admitting: Internal Medicine

## 2010-09-16 DIAGNOSIS — E876 Hypokalemia: Secondary | ICD-10-CM

## 2010-09-20 NOTE — Progress Notes (Addendum)
Summary: pt wants labwork  Phone Note Call from Patient Call back at Work Phone [phone]   Caller: Patient Reason for Call: Talk to Nurse, Talk to Doctor Summary of Call: pt needs to get potassium level checked and pt wants to discuss LDH smear Initial call taken by: Omer Jack,  September 15, 2010 11:58 AM  Follow-up for Phone Call        08/15/10--1230pm--LMTCB--NT 08/15/10--1330pm--pt returning call stating the doctor at [facility] of [place](who has treated her heart disease before she moved here) wants her to have LDH smear--this has been done by [place] but apparently not the right test--and a potassium level--advised we could get potassium, but getting the correct test he wants might mean he has to communicate with dr Dredyn Gubbels--advised i ordered K level and will forward message to [facility] and dr [name] so they can figure out what test he wants--pt agrees Follow-up by: Ledon Snare, RN,  September 15, 2010 1:33 PM  Additional Follow-up for Phone Call Additional follow up Details #1::        pt rtn call Omer Jack  September 15, 2010 1:11 PM      Appended Document: pt wants labwork Called patient.  Needs CBC with manual smear.  Pathologists needs to review morphology of RBCs  Appended Document: pt wants labwork Called patient concerning CBC issue. She states that she will let me know when Dr.Colvin wants lab work again. She did not want to repeat CBC for now.  Appended Document: pt wants labwork Jessica [name] mentioned that when she backed down  Bisoprolol to 5 mg per day she had more energy but increased palps. She will give this dose another week and if palps do not decrease she will go back to the 10mg  per day dose. Will let Dr.Elgie Landino know.

## 2010-10-07 ENCOUNTER — Telehealth: Payer: Self-pay | Admitting: *Deleted

## 2010-10-07 ENCOUNTER — Other Ambulatory Visit: Payer: Self-pay | Admitting: *Deleted

## 2010-10-07 MED ORDER — AMOXICILLIN 500 MG PO CAPS: 500.0000 mg | ORAL_CAPSULE | ORAL | Status: AC

## 2010-10-07 MED ORDER — AMOXICILLIN 500 MG PO CAPS: 500.0000 mg | ORAL_CAPSULE | ORAL | Status: DC

## 2010-10-07 NOTE — Telephone Encounter (Signed)
done

## 2010-10-07 NOTE — Telephone Encounter (Signed)
Pt has dental appt Monday and need pre med, please.  Her dentist is out of town.

## 2010-10-09 ENCOUNTER — Other Ambulatory Visit: Payer: Self-pay | Admitting: Internal Medicine

## 2010-11-09 ENCOUNTER — Telehealth: Payer: Self-pay | Admitting: Internal Medicine

## 2010-11-10 ENCOUNTER — Other Ambulatory Visit: Payer: Self-pay | Admitting: Internal Medicine

## 2010-11-10 NOTE — Telephone Encounter (Signed)
Patient called because she needs lab work ordered for [employer] at [facility] to be faxed to [phone]. She has advised me that she needs the following blood work to be done  INR CBC LDH Smear. Will order for 4/23 at the [place] office.

## 2010-11-14 ENCOUNTER — Other Ambulatory Visit: Payer: BC Managed Care – PPO

## 2010-11-14 ENCOUNTER — Other Ambulatory Visit (INDEPENDENT_AMBULATORY_CARE_PROVIDER_SITE_OTHER): Payer: BC Managed Care – PPO

## 2010-11-14 DIAGNOSIS — Z8679 Personal history of other diseases of the circulatory system: Secondary | ICD-10-CM

## 2010-11-14 LAB — BASIC METABOLIC PANEL
Calcium: 9.1 mg/dL (ref 8.4–10.5)
Chloride: 94 mEq/L — ABNORMAL LOW (ref 96–112)
Creatinine, Ser: 0.7 mg/dL (ref 0.4–1.2)
GFR: 103.68 mL/min (ref >60.00)

## 2010-11-14 LAB — CBC WITH DIFFERENTIAL/PLATELET
Basophils Absolute: 0 10*3/uL (ref 0.0–0.1)
Eosinophils Absolute: 0 10*3/uL (ref 0.0–0.7)
Hemoglobin: 11.1 g/dL — ABNORMAL LOW (ref 12.0–15.0)
Lymphocytes Relative: 13 % (ref 12.0–46.0)
Lymphs Abs: 0.8 10*3/uL (ref 0.7–4.0)
MCHC: 33.7 g/dL (ref 30.0–36.0)
MCV: 99 fl (ref 78.0–100.0)
Monocytes Absolute: 0.3 10*3/uL (ref 0.1–1.0)
Neutro Abs: 5 10*3/uL (ref 1.4–7.7)
RDW: 16 % — ABNORMAL HIGH (ref 11.5–14.6)

## 2010-11-14 LAB — PROTIME-INR
INR: 3.3 ratio — ABNORMAL HIGH (ref 0.8–1.0)
Prothrombin Time: 32.7 s — ABNORMAL HIGH (ref 10.2–12.4)

## 2010-11-15 ENCOUNTER — Telehealth: Payer: Self-pay | Admitting: Internal Medicine

## 2010-11-15 NOTE — Telephone Encounter (Signed)
Pt calls today with increasing shortness of breath more so in the past week.  She is taking her HCTZ, Spironolactone, and furosemide daily. She doesn't know if this "is an indication of something else worse.  If I didn't take the HCTZ and Spironolactone daily I would feel like I am going to blow up".  She says this has been gradually getting worse the past couple of months but more so lately.  I have made her an appt with Dr. [name] for 4/27 at 9:30am.  Reassured her and instructed her to call us if she needed anything. Mylo Red RN

## 2010-11-17 ENCOUNTER — Encounter: Payer: Self-pay | Admitting: Internal Medicine

## 2010-11-18 ENCOUNTER — Other Ambulatory Visit: Payer: Self-pay | Admitting: Internal Medicine

## 2010-11-18 ENCOUNTER — Ambulatory Visit (HOSPITAL_COMMUNITY): Payer: BC Managed Care – PPO | Attending: Internal Medicine | Admitting: Radiology

## 2010-11-18 ENCOUNTER — Ambulatory Visit (INDEPENDENT_AMBULATORY_CARE_PROVIDER_SITE_OTHER): Payer: BC Managed Care – PPO | Admitting: Internal Medicine

## 2010-11-18 ENCOUNTER — Encounter: Payer: Self-pay | Admitting: Internal Medicine

## 2010-11-18 VITALS — BP 113/76 | HR 61 | Ht 64.0 in | Wt 129.0 lb

## 2010-11-18 DIAGNOSIS — Z8679 Personal history of other diseases of the circulatory system: Secondary | ICD-10-CM

## 2010-11-18 DIAGNOSIS — I499 Cardiac arrhythmia, unspecified: Secondary | ICD-10-CM

## 2010-11-18 DIAGNOSIS — I059 Rheumatic mitral valve disease, unspecified: Secondary | ICD-10-CM

## 2010-11-18 DIAGNOSIS — R0602 Shortness of breath: Secondary | ICD-10-CM | POA: Insufficient documentation

## 2010-11-18 DIAGNOSIS — D599 Acquired hemolytic anemia, unspecified: Secondary | ICD-10-CM

## 2010-11-18 DIAGNOSIS — R5383 Other fatigue: Secondary | ICD-10-CM

## 2010-11-18 DIAGNOSIS — Z954 Presence of other heart-valve replacement: Secondary | ICD-10-CM

## 2010-11-18 DIAGNOSIS — Z9889 Other specified postprocedural states: Secondary | ICD-10-CM

## 2010-11-18 DIAGNOSIS — I359 Nonrheumatic aortic valve disorder, unspecified: Secondary | ICD-10-CM

## 2010-11-18 DIAGNOSIS — R0989 Other specified symptoms and signs involving the circulatory and respiratory systems: Secondary | ICD-10-CM

## 2010-11-18 DIAGNOSIS — R5381 Other malaise: Secondary | ICD-10-CM

## 2010-11-18 LAB — BASIC METABOLIC PANEL
BUN: 17 mg/dL (ref 6–23)
CO2: 33 mEq/L — ABNORMAL HIGH (ref 19–32)
Calcium: 9.6 mg/dL (ref 8.4–10.5)
Chloride: 92 mEq/L — ABNORMAL LOW (ref 96–112)
Creatinine, Ser: 0.8 mg/dL (ref 0.4–1.2)
Glucose, Bld: 78 mg/dL (ref 70–99)

## 2010-11-18 LAB — CBC WITH DIFFERENTIAL/PLATELET
Basophils Absolute: 0 10*3/uL (ref 0.0–0.1)
Basophils Relative: 0 % (ref 0–1)
Eosinophils Absolute: 0 10*3/uL (ref 0.0–0.7)
Eosinophils Absolute: 0.1 10*3/uL (ref 0.0–0.7)
Hemoglobin: 12.2 g/dL (ref 12.0–15.0)
Lymphocytes Relative: 11.6 % — ABNORMAL LOW (ref 12.0–46.0)
Lymphs Abs: 0.9 10*3/uL (ref 0.7–4.0)
MCHC: 34.3 g/dL (ref 30.0–36.0)
MCHC: 32.9 g/dL (ref 30.0–36.0)
MCV: 97.1 fl (ref 78.0–100.0)
Monocytes Absolute: 0.4 10*3/uL (ref 0.1–1.0)
Monocytes Relative: 6 % (ref 3–12)
Neutro Abs: 5.5 10*3/uL (ref 1.7–7.7)
Neutrophils Relative %: 81.8 % — ABNORMAL HIGH (ref 43.0–77.0)
Neutrophils Relative %: 80 % — ABNORMAL HIGH (ref 43–77)
Platelets: 327 10*3/uL (ref 150–400)
RBC: 3.84 MIL/uL — ABNORMAL LOW (ref 3.87–5.11)
RDW: 15.6 % — ABNORMAL HIGH (ref 11.5–14.6)

## 2010-11-18 NOTE — Patient Instructions (Signed)
Lab work today We will call you with results.  Your physician has requested that you have an echocardiogram. Echocardiography is a painless test that uses sound waves to create images of your heart. It provides your doctor with information about the size and shape of your heart and how well your heart's chambers and valves are working. This procedure takes approximately one hour. There are no restrictions for this procedure.   

## 2010-11-18 NOTE — Progress Notes (Signed)
HPIShe has a history of AV canal defect, s/p repair early in life.  Review of her records shows she underwent MV replacement initially with heterograft and then 23 mm St Jude mechanical valve.   In May 1991 she underwent modified Konno operation with a 25 mm St Jude valve in the aortic position. In 1995 she had a 23 mm Carbomedics valve placed in the mitral position for a partially obstructed valve.  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  All surgeries were done at [facility]. 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  There was trivial AI.  LV and RV function were normal  RV mildly dilated  Atria were mildly dilated..  No fistula was seen.  Pulmonary pressures were mildly increased.  RV pressures normal. Jessica [name] returns today complaining of increased fatigue, increased SOB with activity.>  Initially she attributed her symptoms to overwork because of tax season.  Now that taxes have been turned in for about 2 wks she still notices her symptoms.  She still notes intermittent palpitations.  Not associated with activity or with SOB.   Note that she is planning a trip to [place] and [place] at the end of May.   Allergies  Allergen Reactions  . Povidone-Iodine     Current Outpatient Prescriptions  Medication Sig Dispense Refill  . bisoprolol (ZEBETA) 5 MG tablet Take 7.5 mg by mouth daily.        . CYMBALTA 30 MG capsule TAKE 1 CAPSULE BY MOUTH EVERY DAY  30 capsule  0  . digoxin (LANOXIN) 0.25 MG tablet Take 250 mcg by mouth daily.        . furosemide (LASIX) 40 MG tablet Take 40 mg by mouth daily.        . hydrochlorothiazide 50 MG tablet TAKE 1 TABLET BY MOUTH DAILY  30 tablet  0  . norethindrone-ethinyl estradiol (ORTHO-NOVUM 1-35 TAB,NORTREL 1-35 TAB) 1-35 MG-MCG per tablet Take 1 tablet by mouth daily.        Marland Kitchen spironolactone (ALDACTONE) 50 MG tablet Take 50 mg by mouth daily.       Marland Kitchen  warfarin (COUMADIN) 4 MG tablet Take 4 mg by mouth as directed.        Marland Kitchen DISCONTD: ferrous sulfate 325 (65 FE) MG tablet Take 325 mg by mouth daily with breakfast.          Past Medical History  Diagnosis Date  . Atrioventricular canal defect     congenital  . Migraine   . Depression   . Hepatitis C     Past Surgical History  Procedure Date  . Aorto ventricular canal defect repair     age 46 mths  . Mitral valve replacement   . Aortic valve replacement     Family History  Problem Relation Age of Onset  . Colon cancer    . Colon polyps      History   Social History  . Marital Status: Single    Spouse Name: N/A    Number of Children: N/A  . Years of Education: N/A   Occupational History  . legal specialist    Social History Main Topics  . Smoking status: Never Smoker   . Smokeless tobacco: Not on file  . Alcohol Use: Yes     occasionally  . Drug Use: No  . Sexually Active: Not  on file   Other Topics Concern  . Not on file   Social History Narrative  . No narrative on file    Review of Systems:  All systems reviewed.  They are negative to the above problem except as previously stated.  Vital Signs: BP 113/76  Pulse 61  Ht 5\' 4"  (1.626 m)  Wt 129 lb (58.514 kg)  BMI 22.14 kg/m2  Physical Exam  HEENT:  Normocephalic, atraumatic. EOMI, PERRLA.  Neck: JVP is normal. No thyromegaly. No bruits.  Lungs: clear to auscultation. No rales no wheezes.  Heart: Regular rate and rhythm. Normal S1, increased S2.  GR III/VI systolic murmur.  Gr I-II/VI diastolic murmur. No S3.    Abdomen:  Supple, nontender. Normal bowel sounds. No masses. No hepatomegaly.  Extremities:   Good distal pulses throughout. No lower extremity edema.  Musculoskeletal :moving all extremities.  Neuro:   alert and oriented x3.  CN II-XII grossly intact.   Assessment and Plan:

## 2010-11-21 ENCOUNTER — Other Ambulatory Visit (HOSPITAL_COMMUNITY): Payer: Self-pay | Admitting: Radiology

## 2010-11-21 LAB — PATHOLOGIST SMEAR REVIEW

## 2010-11-24 NOTE — Assessment & Plan Note (Signed)
Mykhia has worsening SOB.  Labs are relatively stable.  Give no indication for symptoms I would recomm an echo to reeval valve function, pulmonary pressures. Consider holter monitor.

## 2010-11-25 ENCOUNTER — Telehealth: Payer: Self-pay | Admitting: *Deleted

## 2010-11-25 DIAGNOSIS — R002 Palpitations: Secondary | ICD-10-CM

## 2010-11-25 NOTE — Telephone Encounter (Signed)
Order sent to [facility] per Dr.Ross

## 2010-11-28 ENCOUNTER — Telehealth: Payer: Self-pay | Admitting: *Deleted

## 2010-11-28 DIAGNOSIS — R002 Palpitations: Secondary | ICD-10-CM

## 2010-11-28 NOTE — Telephone Encounter (Signed)
Error

## 2010-12-05 ENCOUNTER — Encounter (INDEPENDENT_AMBULATORY_CARE_PROVIDER_SITE_OTHER): Payer: BC Managed Care – PPO

## 2010-12-05 DIAGNOSIS — I472 Ventricular tachycardia: Secondary | ICD-10-CM

## 2010-12-08 DIAGNOSIS — I499 Cardiac arrhythmia, unspecified: Secondary | ICD-10-CM | POA: Insufficient documentation

## 2010-12-08 NOTE — Assessment & Plan Note (Signed)
Hgb has been rel stable.

## 2010-12-08 NOTE — Assessment & Plan Note (Signed)
Echo to reevaluate 

## 2010-12-08 NOTE — Assessment & Plan Note (Signed)
Will schedule for holter.  Continue medicines.

## 2010-12-08 NOTE — Assessment & Plan Note (Signed)
Echo to reevaluate

## 2010-12-09 NOTE — Op Note (Signed)
[name], [name]                ACCOUNT NO.:  [id number]   MEDICAL RECORD NO.:  [id number]          PATIENT TYPE:  AMB   LOCATION:  DSC                          FACILITY:  [facility]   PHYSICIAN:  Cindee Salt, M.D.       DATE OF BIRTH:  1976/06/17   DATE OF PROCEDURE:  07/18/2006  DATE OF DISCHARGE:                               OPERATIVE REPORT   PREOPERATIVE DIAGNOSIS:  Triangular fibrocartilage complex tear, right  wrist.   POSTOPERATIVE DIAGNOSIS:  Triangular fibrocartilage complex tear, right  wrist.  __________  type 1 stretch scapholunate ligament, right wrist.   OPERATION:  Arthroscopy and debridement triangular fibrocartilage  complex (TFCC) ID the tear, right wrist.   SURGEON:  Kuzma.   ASSISTANT:  Carolyne Fiscal R.N.   ANESTHESIA:  General.   HISTORY:  The patient is a 35 year old female with a history of  bilateral wrist pain.  Arthrogram MRI reveals that she has triangular  fibrocartilage complex tears bilaterally.  She has undergone debridement  of the left side and is admitted now for treatment of the right.  Questions are encouraged and answered.  Risks and complications are well  known to the patient.  In the preoperative area,  the extremity is  marked by both the patient and the surgeon.   PROCEDURE:  The patient is brought to the operating room where a general  anesthetic was carried out without difficulty.  She was prepped using  DuraPrep, supine position, right arm free.  After a 3-minute dry time  she was draped.  The arthroscopy tower applied.  Ten pounds of traction  applied.  The joint was inflated, with a  3-4 portal, taking care to  protect the EPL tendon.  A blunt trocar was used enter the joint after  making a transverse incision in the skin deepening this with a hemostat.  The scope was introduced.  This was a 1.9 mm scope.  The volar radial  wrist ligaments were intact.  The scapholunate ligament showed a  patulous nature.  The articular surface of the  distal radius proximal  scaphoid lunate showed no changes.  A ID tear was noted at the  triangular fibrocartilage.  An 18-gauge Intracath needle was introduced  on the ulnar aspect through the 6 __________  portal.  A 4-5 portal was  opened after localization with 22-gauge needle.  A probe was inserted.  The lunotriquetral joint was able to be visualized from the radial  portal.  This was found to be intact.  The volar ulnar ligaments were  intact.  The tear was probed.  The joint was then inspected through the  4-5 portal.  The ulnar ligaments were intact.  The midcarpal joint was  then inflated.  There was no crossover between the midcarpal joint and  the proximal joint with inflation of the joint.  The midcarpal radial  portal was opened transversely after localization with a 22-gauge  needle.  The proximal aspect of the capitate hamate showed no changes.  The STT joint showed no changes.  The articular surface was well  maintained.  Type  2 lunate was present.  There was no gross instability  to the lunotriquetral joint or scapholunate joint.  The scope was then  introduced into the 3-4 portal.  Debridement to the triangular  fibrocartilage complex was then performed using an ArthroWand and full  radius shaver.  This was done to enlarge the area back to a stable rim.  Pronation and supination showed no instability.  There were no  significant changes on the distal pole of the ulna.  The instruments  were removed.   The portals closed with interrupted 5-0 nylon sutures.  A sterile  compressive dressing and splint was applied.  The patient tolerated the  procedure well and was taken to the recovery room for observation in  satisfactory condition.  She is discharged home __________  1 week on  Vicodin.           ______________________________  Cindee Salt, M.D.     GK/MEDQ  D:  07/18/2006  T:  07/18/2006  Job:  [id number]   cc:   Cindee Salt, M.D.

## 2010-12-09 NOTE — Op Note (Signed)
Jessica [name], Jessica [name]                ACCOUNT NO.:  [id number]   MEDICAL RECORD NO.:  [id number]          PATIENT TYPE:  AMB   LOCATION:  DSC                          FACILITY:  [facility]   PHYSICIAN:  Cindee Salt, M.D.       DATE OF BIRTH:  05-May-1976   DATE OF PROCEDURE:  DATE OF DISCHARGE:                                 OPERATIVE REPORT   PREOPERATIVE DIAGNOSIS:  Triangular fibrocartilage complex tear, left wrist.   POSTOPERATIVE DIAGNOSIS:  Triangular fibrocartilage complex tear, left  wrist.   OPERATION:  Arthroscopy left wrist with debridement of triangular  fibrocartilage complex 1-D tear.   SURGEON:  Cindee Salt, M.D.   ASSISTANT:  Carolyne Fiscal R.N.   ANESTHESIA:  General.   HISTORY:  The patient is a 35 year old female with a history of left wrist  pain.  This has not responded to conservative treatment.  Arthrographic CT  scan revealed TFCC tear on the radial aspect.  She is desirous of a  debridement and repair as dictated by findings.  She is aware of risks and  complications including infection, recurrence, incomplete relief of  symptoms, dystrophy.   PROCEDURE:  The patient is brought to the operating room after antibiotic  was given in the preoperative area.  The extremity marked by both the  patient and surgeon, questions entertained and answered and her laboratory  work revealed that her PTT INR was normal.   PROCEDURE:  The patient was brought to the operating room where a general  anesthetic was carried out without difficulty.  She was prepped using  DuraPrep, supine position, left arm free.  The limb was placed in the  arthroscopy tower and after draping and a 3-minute dry time, the joint  inflated through the 3/4 portal.  Care taken to protect the EPL tendon.  A  transverse incision was made through skin only, deepened with hemostat.  Blunt trocar was used enter the joint.  The joint was inspected after  placement of the scope.  The scapholunate ligament, volar  radial wrist  ligaments were intact, cartilage showed no significant degenerative changes.  A 1-D tear to the TFCC was noted.  An irrigation catheter was placed in 6U.  A 6R portal was then opened after localization with a 22 gauge needle.  The  scope was then introduced in the ulnar aspect after using blunt trocar.  The  lunotriquetral joint showed no changes.  Midcarpal joint was then inspected  after inflation of the radial midcarpal portal with saline.  No  extravasation was noted into proximal joint.  Moderate difficulty was  encountered.  The joint was first entered the proximal row.  The midcarpal  joint was then relocalized with the 22 gauge needle.  Blunt trocar was then  used to enter the joint after opening the portal with a hemostat.  The joint  was inspected.  The scapholunate ligament showed no changes, nor did the  lunotriquetral.  The articular surface cartilage both proximal and distal  row showed no changes.  STT joint showed no changes.  There was no gross  instability.  The scope was then reintroduced into the 03/04 portal.  The  triangle fibrocartilage complex was then debrided with an Arthrowand, being  certain to maintain excellent flow and then a full radius shaver.  Bleeders  were electrocauterized with the Arthrowand, the instruments  were removed.  The portals closed with interrupted 5-0 chromic sutures.  A  sterile compressive dressing and splint was applied.  The patient tolerated  the procedure well and was taken to the recovery room for observation in  satisfactory condition.  She is discharged home to return to the [facility] [place] in 1 week on Vicodin.           ______________________________  Cindee Salt, M.D.     GK/MEDQ  D:  03/29/2006  T:  03/29/2006  Job:  [id number]

## 2011-01-09 ENCOUNTER — Telehealth: Payer: Self-pay | Admitting: Internal Medicine

## 2011-01-09 DIAGNOSIS — Z952 Presence of prosthetic heart valve: Secondary | ICD-10-CM

## 2011-01-09 NOTE — Telephone Encounter (Signed)
Need to be set up for blood work

## 2011-01-13 NOTE — Telephone Encounter (Signed)
Called patient back. Due to be seen at [facility] in the Fall. Tolerated her trip to [place] well.  Wants to know if she needs any lab work done besides Protime ( has not been checked in 6 weeks.) Will ask Dr.Ross on Monday 6/25 and call her back.

## 2011-01-15 NOTE — Telephone Encounter (Signed)
Check CBC, BMET and BNP.

## 2011-01-16 NOTE — Telephone Encounter (Signed)
Called patient and left her a message on her private cell phone to come to the [facility]. Office for lab work this week.

## 2011-01-17 ENCOUNTER — Other Ambulatory Visit: Payer: BC Managed Care – PPO

## 2011-01-18 ENCOUNTER — Other Ambulatory Visit (INDEPENDENT_AMBULATORY_CARE_PROVIDER_SITE_OTHER): Payer: BC Managed Care – PPO

## 2011-01-18 DIAGNOSIS — Z952 Presence of prosthetic heart valve: Secondary | ICD-10-CM

## 2011-01-18 DIAGNOSIS — Z954 Presence of other heart-valve replacement: Secondary | ICD-10-CM

## 2011-01-18 LAB — CBC WITH DIFFERENTIAL/PLATELET
Eosinophils Absolute: 0 10*3/uL (ref 0.0–0.7)
HCT: 34.5 % — ABNORMAL LOW (ref 36.0–46.0)
Lymphs Abs: 1.1 10*3/uL (ref 0.7–4.0)
MCHC: 33.2 g/dL (ref 30.0–36.0)
MCV: 98.7 fl (ref 78.0–100.0)
Monocytes Absolute: 0.3 10*3/uL (ref 0.1–1.0)
Neutrophils Relative %: 74.2 % (ref 43.0–77.0)
Platelets: 236 10*3/uL (ref 150.0–400.0)

## 2011-01-18 LAB — BASIC METABOLIC PANEL
BUN: 16 mg/dL (ref 6–23)
Calcium: 9.1 mg/dL (ref 8.4–10.5)
GFR: 105.33 mL/min (ref >60.00)
Potassium: 3.7 mEq/L (ref 3.5–5.1)
Sodium: 139 mEq/L (ref 135–145)

## 2011-01-18 LAB — PROTIME-INR: INR: 2.2 ratio — ABNORMAL HIGH (ref 0.8–1.0)

## 2011-01-18 LAB — BRAIN NATRIURETIC PEPTIDE: Pro B Natriuretic peptide (BNP): 101 pg/mL — ABNORMAL HIGH (ref 0.0–100.0)

## 2011-01-24 ENCOUNTER — Encounter: Payer: Self-pay | Admitting: Internal Medicine

## 2011-02-01 ENCOUNTER — Telehealth: Payer: Self-pay | Admitting: Internal Medicine

## 2011-02-01 NOTE — Telephone Encounter (Signed)
Pt calling re results of lab work

## 2011-02-14 ENCOUNTER — Telehealth: Payer: Self-pay | Admitting: Internal Medicine

## 2011-02-14 NOTE — Telephone Encounter (Signed)
Pt calling re lab work

## 2011-02-14 NOTE — Telephone Encounter (Signed)
Left a message to call back.

## 2011-02-23 NOTE — Telephone Encounter (Signed)
PT  AWARE WILL FORWARD  MESSAGE TO  DR ROSS  FOR REVIEW ./CY 

## 2011-03-24 ENCOUNTER — Ambulatory Visit (INDEPENDENT_AMBULATORY_CARE_PROVIDER_SITE_OTHER): Payer: BC Managed Care – PPO | Admitting: Internal Medicine

## 2011-03-24 ENCOUNTER — Telehealth: Payer: Self-pay | Admitting: Internal Medicine

## 2011-03-24 DIAGNOSIS — Z954 Presence of other heart-valve replacement: Secondary | ICD-10-CM

## 2011-03-24 DIAGNOSIS — Z952 Presence of prosthetic heart valve: Secondary | ICD-10-CM

## 2011-03-24 LAB — POCT INR: INR: 2.4

## 2011-03-24 MED ORDER — ZALEPLON 10 MG PO CAPS: 10.0000 mg | ORAL_CAPSULE | Freq: Every day | ORAL | Status: DC

## 2011-03-24 NOTE — Telephone Encounter (Signed)
Per dr Lovell Sheehan- may have sonata 10 mg 12 qhs prn sleep #30 with 1 refill

## 2011-03-24 NOTE — Patient Instructions (Signed)
Same dose 

## 2011-03-24 NOTE — Telephone Encounter (Signed)
Pt has recently had issues with sleeping. She has not problem falling asleep but wakes up several times a night and then has problems going back to sleep. Pleas contact pt.

## 2011-03-28 ENCOUNTER — Ambulatory Visit (INDEPENDENT_AMBULATORY_CARE_PROVIDER_SITE_OTHER): Payer: BC Managed Care – PPO | Admitting: Family Medicine

## 2011-03-28 ENCOUNTER — Encounter: Payer: Self-pay | Admitting: Family Medicine

## 2011-03-28 VITALS — BP 110/72 | HR 66 | Temp 98.5°F | Wt 125.0 lb

## 2011-03-28 DIAGNOSIS — J329 Chronic sinusitis, unspecified: Secondary | ICD-10-CM

## 2011-03-28 MED ORDER — AZITHROMYCIN 250 MG PO TABS: ORAL_TABLET | ORAL | Status: AC

## 2011-03-28 NOTE — Progress Notes (Signed)
  Subjective:    Patient ID: Jessica [name], female    DOB: 16-Nov-1975, 35 y.o.   MRN: [id number]  HPI Here for 6 days of stuffy head, sinus pressure and frontal HAs, PND, ST, and a dry cough. No fever. On fluids and Tylenol.    Review of Systems  Constitutional: Negative.   HENT: Positive for congestion, postnasal drip and sinus pressure. Negative for ear pain and neck pain.   Eyes: Negative.   Respiratory: Positive for cough.        Objective:   Physical Exam  Constitutional: She appears well-developed and well-nourished.  HENT:  Right Ear: External ear normal.  Left Ear: External ear normal.  Nose: Nose normal.  Mouth/Throat: Oropharynx is clear and moist. No oropharyngeal exudate.  Eyes: Conjunctivae are normal. Pupils are equal, round, and reactive to light.  Neck: Normal range of motion. Neck supple. No thyromegaly present.  Cardiovascular: Normal rate and regular rhythm.        Her artificial valve is heard throughout the chest   Pulmonary/Chest: Effort normal and breath sounds normal. No respiratory distress. She has no wheezes. She has no rales. She exhibits no tenderness.  Lymphadenopathy:    She has no cervical adenopathy.          Assessment & Plan:  Recheck prn

## 2011-04-19 ENCOUNTER — Telehealth: Payer: Self-pay | Admitting: Internal Medicine

## 2011-04-19 DIAGNOSIS — Z952 Presence of prosthetic heart valve: Secondary | ICD-10-CM

## 2011-04-19 NOTE — Telephone Encounter (Signed)
Pt calls today for lab work to be done next Wednesday ( cbc,bmp,pt). At [facility] lab Pt is having a heart cath & poss intervention with Dr. Leanora Cover ( Dr. Vladimir Faster) in [place] within next 2 weeks. Orders placed. Mylo Red RN

## 2011-04-19 NOTE — Telephone Encounter (Signed)
Pt calling stating that she wanted to have blood work done. Please return pt call to discuss further.

## 2011-04-26 ENCOUNTER — Ambulatory Visit: Payer: BC Managed Care – PPO

## 2011-04-26 ENCOUNTER — Other Ambulatory Visit: Payer: BC Managed Care – PPO

## 2011-04-26 DIAGNOSIS — Z952 Presence of prosthetic heart valve: Secondary | ICD-10-CM

## 2011-04-26 LAB — CBC WITH DIFFERENTIAL/PLATELET
Eosinophils Relative: 0 % (ref 0.0–5.0)
Lymphocytes Relative: 19 % (ref 12.0–46.0)
MCV: 93.6 fl (ref 78.0–100.0)
Monocytes Absolute: 0.3 10*3/uL (ref 0.1–1.0)
Neutrophils Relative %: 74.2 % (ref 43.0–77.0)
Platelets: 228 10*3/uL (ref 150.0–400.0)
RBC: 3.58 Mil/uL — ABNORMAL LOW (ref 3.87–5.11)
WBC: 4.4 10*3/uL — ABNORMAL LOW (ref 4.5–10.5)

## 2011-04-26 LAB — BASIC METABOLIC PANEL
BUN: 19 mg/dL (ref 6–23)
Calcium: 9 mg/dL (ref 8.4–10.5)
Chloride: 97 mEq/L (ref 96–112)
Creatinine, Ser: 0.7 mg/dL (ref 0.4–1.2)
GFR: 103.41 mL/min (ref >60.00)

## 2011-04-26 LAB — PROTIME-INR
INR: 2.44 — ABNORMAL HIGH (ref <1.50)
Prothrombin Time: 27.3 seconds — ABNORMAL HIGH (ref 11.6–15.2)

## 2011-04-27 ENCOUNTER — Telehealth: Payer: Self-pay | Admitting: Internal Medicine

## 2011-04-27 ENCOUNTER — Other Ambulatory Visit: Payer: Self-pay | Admitting: *Deleted

## 2011-04-27 DIAGNOSIS — Z79899 Other long term (current) drug therapy: Secondary | ICD-10-CM

## 2011-04-27 DIAGNOSIS — I509 Heart failure, unspecified: Secondary | ICD-10-CM

## 2011-04-27 DIAGNOSIS — E876 Hypokalemia: Secondary | ICD-10-CM

## 2011-04-27 MED ORDER — POTASSIUM CHLORIDE CRYS ER 20 MEQ PO TBCR: 20.0000 meq | EXTENDED_RELEASE_TABLET | Freq: Two times a day (BID) | ORAL | Status: DC

## 2011-04-27 NOTE — Telephone Encounter (Signed)
Patient would like to have labs fax to Pediatric Cardiology at [facility]. Lab faxed Att: Dr. Vladimir Faster to fax # [phone].

## 2011-04-27 NOTE — Telephone Encounter (Signed)
Pt returning your call

## 2011-04-28 ENCOUNTER — Other Ambulatory Visit: Payer: Self-pay | Admitting: *Deleted

## 2011-04-28 ENCOUNTER — Telehealth: Payer: Self-pay | Admitting: Internal Medicine

## 2011-04-28 DIAGNOSIS — E876 Hypokalemia: Secondary | ICD-10-CM

## 2011-04-28 DIAGNOSIS — Z79899 Other long term (current) drug therapy: Secondary | ICD-10-CM

## 2011-04-28 DIAGNOSIS — I509 Heart failure, unspecified: Secondary | ICD-10-CM

## 2011-04-28 MED ORDER — POTASSIUM CHLORIDE CRYS ER 20 MEQ PO TBCR: EXTENDED_RELEASE_TABLET | ORAL | Status: DC

## 2011-04-28 NOTE — Telephone Encounter (Signed)
Patient aware to take one extra potassium tablet by mouth today only, then one tablet by mouth daily as scheduled. Patient is to have BMET re-check on Monday at [facility] lab. Patient aware.

## 2011-04-28 NOTE — Telephone Encounter (Signed)
Pt has a question about her K+ how often dose she need to take it

## 2011-05-01 ENCOUNTER — Other Ambulatory Visit: Payer: BC Managed Care – PPO | Admitting: *Deleted

## 2011-05-01 ENCOUNTER — Other Ambulatory Visit (INDEPENDENT_AMBULATORY_CARE_PROVIDER_SITE_OTHER): Payer: BC Managed Care – PPO

## 2011-05-01 DIAGNOSIS — Z79899 Other long term (current) drug therapy: Secondary | ICD-10-CM

## 2011-05-01 DIAGNOSIS — E876 Hypokalemia: Secondary | ICD-10-CM

## 2011-05-01 DIAGNOSIS — I509 Heart failure, unspecified: Secondary | ICD-10-CM

## 2011-05-01 LAB — HEPATIC FUNCTION PANEL
Albumin: 4.3 g/dL (ref 3.5–5.2)
Total Bilirubin: 1.8 mg/dL — ABNORMAL HIGH (ref 0.3–1.2)

## 2011-05-01 LAB — BASIC METABOLIC PANEL
BUN: 20 mg/dL (ref 6–23)
Calcium: 9.1 mg/dL (ref 8.4–10.5)
Chloride: 99 mEq/L (ref 96–112)
Creatinine, Ser: 0.8 mg/dL (ref 0.4–1.2)
GFR: 84.72 mL/min (ref >60.00)

## 2011-05-01 LAB — BRAIN NATRIURETIC PEPTIDE: Pro B Natriuretic peptide (BNP): 357 pg/mL — ABNORMAL HIGH (ref 0.0–100.0)

## 2011-05-08 ENCOUNTER — Telehealth: Payer: Self-pay | Admitting: Internal Medicine

## 2011-05-08 ENCOUNTER — Ambulatory Visit (INDEPENDENT_AMBULATORY_CARE_PROVIDER_SITE_OTHER): Payer: BC Managed Care – PPO | Admitting: Internal Medicine

## 2011-05-08 DIAGNOSIS — Z952 Presence of prosthetic heart valve: Secondary | ICD-10-CM

## 2011-05-08 DIAGNOSIS — Z954 Presence of other heart-valve replacement: Secondary | ICD-10-CM

## 2011-05-08 DIAGNOSIS — Z9889 Other specified postprocedural states: Secondary | ICD-10-CM

## 2011-05-08 LAB — POCT INR: INR: 1.6

## 2011-05-08 MED ORDER — FUROSEMIDE 40 MG PO TABS: 40.0000 mg | ORAL_TABLET | Freq: Every day | ORAL | Status: DC

## 2011-05-08 NOTE — Telephone Encounter (Signed)
Called and LMOM . Will send script to [facility].

## 2011-05-08 NOTE — Patient Instructions (Signed)
  Latest dosing instructions   Total Sun Mon Tue Wed Thu Fri Sat   28 4 mg 4 mg 4 mg 4 mg 4 mg 4 mg 4 mg    (4 mg1) (4 mg1) (4 mg1) (4 mg1) (4 mg1) (4 mg1) (4 mg1)        

## 2011-05-08 NOTE — Telephone Encounter (Signed)
Pt wants another rx for lasix please call

## 2011-05-10 ENCOUNTER — Ambulatory Visit (INDEPENDENT_AMBULATORY_CARE_PROVIDER_SITE_OTHER)
Admission: RE | Admit: 2011-05-10 | Discharge: 2011-05-10 | Disposition: A | Discharge status: Home / Self Care | Payer: BC Managed Care – PPO | Source: Ambulatory Visit | Attending: Internal Medicine | Admitting: Internal Medicine

## 2011-05-10 ENCOUNTER — Other Ambulatory Visit (INDEPENDENT_AMBULATORY_CARE_PROVIDER_SITE_OTHER): Payer: BC Managed Care – PPO | Admitting: *Deleted

## 2011-05-10 ENCOUNTER — Telehealth: Payer: Self-pay | Admitting: Internal Medicine

## 2011-05-10 ENCOUNTER — Other Ambulatory Visit: Payer: Self-pay | Admitting: Internal Medicine

## 2011-05-10 ENCOUNTER — Other Ambulatory Visit: Payer: Self-pay

## 2011-05-10 DIAGNOSIS — I509 Heart failure, unspecified: Secondary | ICD-10-CM

## 2011-05-10 LAB — PROTIME-INR
INR: 2 ratio — ABNORMAL HIGH (ref 0.8–1.0)
Prothrombin Time: 22.2 s — ABNORMAL HIGH (ref 10.2–12.4)

## 2011-05-10 LAB — CBC WITH DIFFERENTIAL/PLATELET
Basophils Absolute: 0 10*3/uL (ref 0.0–0.1)
Eosinophils Absolute: 0 10*3/uL (ref 0.0–0.7)
Lymphocytes Relative: 14.1 % (ref 12.0–46.0)
Monocytes Relative: 5.5 % (ref 3.0–12.0)
Platelets: 207 10*3/uL (ref 150.0–400.0)
RDW: 16.6 % — ABNORMAL HIGH (ref 11.5–14.6)

## 2011-05-10 LAB — BASIC METABOLIC PANEL
Calcium: 9.4 mg/dL (ref 8.4–10.5)
GFR: 82.39 mL/min (ref >60.00)
Glucose, Bld: 82 mg/dL (ref 70–99)
Potassium: 3.3 mEq/L — ABNORMAL LOW (ref 3.5–5.1)
Sodium: 135 mEq/L (ref 135–145)

## 2011-05-10 LAB — C-REACTIVE PROTEIN: CRP: 0.9 mg/dL — ABNORMAL HIGH (ref <0.60)

## 2011-05-10 LAB — SEDIMENTATION RATE: Sed Rate: 18 mm/hr (ref 0–22)

## 2011-05-10 NOTE — Telephone Encounter (Signed)
Spoke with patient.  Got home from [place] at the end of the weak.  They did a percutaneous repair of perivalvular leaks.  Still with 3 small jets.  (6 hrs) End of weekend and this week became more SOB.  Noted abdomenal bloating Patient spoke to E. Colvin.  He recomm labs and echo.

## 2011-05-10 NOTE — Telephone Encounter (Signed)
Pt calling re increased, ascites and sob since sunday

## 2011-05-10 NOTE — Telephone Encounter (Signed)
Pt calls with 5 pound weight gain since cath last week. States she is having more ascites and shortness of breath. She is taking all of her medications. "I will give Dr. [name] a call on her cell phone" Mylo Red RN

## 2011-05-11 ENCOUNTER — Other Ambulatory Visit (HOSPITAL_COMMUNITY): Payer: Self-pay | Admitting: Internal Medicine

## 2011-05-11 ENCOUNTER — Ambulatory Visit (HOSPITAL_COMMUNITY): Payer: BC Managed Care – PPO | Attending: Internal Medicine | Admitting: Radiology

## 2011-05-11 DIAGNOSIS — I059 Rheumatic mitral valve disease, unspecified: Secondary | ICD-10-CM

## 2011-05-11 DIAGNOSIS — Z954 Presence of other heart-valve replacement: Secondary | ICD-10-CM | POA: Insufficient documentation

## 2011-05-11 DIAGNOSIS — I079 Rheumatic tricuspid valve disease, unspecified: Secondary | ICD-10-CM | POA: Insufficient documentation

## 2011-05-11 DIAGNOSIS — I379 Nonrheumatic pulmonary valve disorder, unspecified: Secondary | ICD-10-CM | POA: Insufficient documentation

## 2011-05-11 DIAGNOSIS — I080 Rheumatic disorders of both mitral and aortic valves: Secondary | ICD-10-CM | POA: Insufficient documentation

## 2011-05-11 DIAGNOSIS — I359 Nonrheumatic aortic valve disorder, unspecified: Secondary | ICD-10-CM

## 2011-05-11 LAB — LACTATE DEHYDROGENASE, ISOENZYMES

## 2011-05-12 ENCOUNTER — Other Ambulatory Visit (HOSPITAL_COMMUNITY): Payer: BC Managed Care – PPO | Admitting: Radiology

## 2011-05-12 ENCOUNTER — Ambulatory Visit: Payer: BC Managed Care – PPO

## 2011-05-12 ENCOUNTER — Other Ambulatory Visit: Payer: Self-pay | Admitting: *Deleted

## 2011-05-12 ENCOUNTER — Ambulatory Visit (INDEPENDENT_AMBULATORY_CARE_PROVIDER_SITE_OTHER): Payer: BC Managed Care – PPO | Admitting: *Deleted

## 2011-05-12 DIAGNOSIS — I499 Cardiac arrhythmia, unspecified: Secondary | ICD-10-CM

## 2011-05-12 DIAGNOSIS — R5383 Other fatigue: Secondary | ICD-10-CM

## 2011-05-12 DIAGNOSIS — I059 Rheumatic mitral valve disease, unspecified: Secondary | ICD-10-CM

## 2011-05-12 DIAGNOSIS — Z8679 Personal history of other diseases of the circulatory system: Secondary | ICD-10-CM

## 2011-05-12 DIAGNOSIS — R5381 Other malaise: Secondary | ICD-10-CM

## 2011-05-12 LAB — CBC WITH DIFFERENTIAL/PLATELET
Basophils Relative: 0.4 % (ref 0.0–3.0)
Eosinophils Absolute: 0 10*3/uL (ref 0.0–0.7)
MCHC: 33.4 g/dL (ref 30.0–36.0)
MCV: 91.8 fl (ref 78.0–100.0)
Monocytes Absolute: 0.3 10*3/uL (ref 0.1–1.0)
Neutro Abs: 3.7 10*3/uL (ref 1.4–7.7)
Neutrophils Relative %: 74.5 % (ref 43.0–77.0)
RBC: 3.46 Mil/uL — ABNORMAL LOW (ref 3.87–5.11)

## 2011-05-12 LAB — BASIC METABOLIC PANEL
BUN: 26 mg/dL — ABNORMAL HIGH (ref 6–23)
Calcium: 9.4 mg/dL (ref 8.4–10.5)
Creatinine, Ser: 0.9 mg/dL (ref 0.4–1.2)
GFR: 73.26 mL/min (ref >60.00)
Glucose, Bld: 83 mg/dL (ref 70–99)
Sodium: 135 mEq/L (ref 135–145)

## 2011-05-13 LAB — LACTATE DEHYDROGENASE: LDH: 589 U/L — ABNORMAL HIGH (ref 94–250)

## 2011-05-14 LAB — BILIRUBIN, FRACTIONATED(TOT/DIR/INDIR)
Bilirubin, Direct: 0.2 mg/dL (ref 0.0–0.3)
Indirect Bilirubin: 0.9 mg/dL (ref 0.0–0.9)

## 2011-05-14 LAB — BILIRUBIN, DIRECT: Bilirubin, Direct: 0.2 mg/dL (ref 0.0–0.3)

## 2011-05-15 ENCOUNTER — Other Ambulatory Visit (INDEPENDENT_AMBULATORY_CARE_PROVIDER_SITE_OTHER): Payer: BC Managed Care – PPO | Admitting: *Deleted

## 2011-05-15 ENCOUNTER — Encounter: Payer: Self-pay | Admitting: *Deleted

## 2011-05-15 DIAGNOSIS — I059 Rheumatic mitral valve disease, unspecified: Secondary | ICD-10-CM

## 2011-05-15 LAB — BASIC METABOLIC PANEL
BUN: 32 mg/dL — ABNORMAL HIGH (ref 6–23)
Calcium: 9.3 mg/dL (ref 8.4–10.5)
GFR: 54.59 mL/min — ABNORMAL LOW (ref >60.00)
Glucose, Bld: 94 mg/dL (ref 70–99)
Potassium: 3.6 mEq/L (ref 3.5–5.1)
Sodium: 136 mEq/L (ref 135–145)

## 2011-05-15 LAB — CBC WITH DIFFERENTIAL/PLATELET
Basophils Absolute: 0 10*3/uL (ref 0.0–0.1)
Basophils Relative: 0.4 % (ref 0.0–3.0)
Eosinophils Absolute: 0 10*3/uL (ref 0.0–0.7)
Lymphocytes Relative: 16.2 % (ref 12.0–46.0)
MCHC: 33.1 g/dL (ref 30.0–36.0)
MCV: 90.7 fl (ref 78.0–100.0)
Monocytes Absolute: 0.4 10*3/uL (ref 0.1–1.0)
Neutro Abs: 5 10*3/uL (ref 1.4–7.7)
Neutrophils Relative %: 77.4 % — ABNORMAL HIGH (ref 43.0–77.0)
RDW: 16 % — ABNORMAL HIGH (ref 11.5–14.6)

## 2011-05-16 ENCOUNTER — Telehealth: Payer: Self-pay | Admitting: Internal Medicine

## 2011-05-16 NOTE — Telephone Encounter (Signed)
Pt is calling for lab results from yesterday.   She is also requesting that her lab results be faxed to Dr Vladimir Faster in [place].

## 2011-05-16 NOTE — Telephone Encounter (Signed)
Test results

## 2011-05-19 NOTE — Telephone Encounter (Signed)
New message:  Pt called and stated the update is doing OK but not great after cath 2 weeks ago.  Please call regarding additional blood work.

## 2011-05-21 ENCOUNTER — Other Ambulatory Visit: Payer: Self-pay | Admitting: Internal Medicine

## 2011-05-22 ENCOUNTER — Ambulatory Visit (INDEPENDENT_AMBULATORY_CARE_PROVIDER_SITE_OTHER): Payer: BC Managed Care – PPO | Admitting: *Deleted

## 2011-05-22 DIAGNOSIS — Z79899 Other long term (current) drug therapy: Secondary | ICD-10-CM

## 2011-05-22 DIAGNOSIS — I499 Cardiac arrhythmia, unspecified: Secondary | ICD-10-CM

## 2011-05-22 DIAGNOSIS — E785 Hyperlipidemia, unspecified: Secondary | ICD-10-CM

## 2011-05-22 DIAGNOSIS — G43919 Migraine, unspecified, intractable, without status migrainosus: Secondary | ICD-10-CM

## 2011-05-22 DIAGNOSIS — I509 Heart failure, unspecified: Secondary | ICD-10-CM

## 2011-05-22 LAB — CBC WITH DIFFERENTIAL/PLATELET
Eosinophils Relative: 0.6 % (ref 0.0–5.0)
HCT: 32.1 % — ABNORMAL LOW (ref 36.0–46.0)
Hemoglobin: 10.5 g/dL — ABNORMAL LOW (ref 12.0–15.0)
Lymphs Abs: 0.7 10*3/uL (ref 0.7–4.0)
MCV: 89.3 fl (ref 78.0–100.0)
Monocytes Absolute: 0.4 10*3/uL (ref 0.1–1.0)
Monocytes Relative: 8.7 % (ref 3.0–12.0)
Neutro Abs: 3.1 10*3/uL (ref 1.4–7.7)
Platelets: 269 10*3/uL (ref 150.0–400.0)
WBC: 4.3 10*3/uL — ABNORMAL LOW (ref 4.5–10.5)

## 2011-05-22 LAB — BASIC METABOLIC PANEL
CO2: 32 mEq/L (ref 19–32)
Calcium: 9.5 mg/dL (ref 8.4–10.5)
Creatinine, Ser: 0.9 mg/dL (ref 0.4–1.2)
Sodium: 136 mEq/L (ref 135–145)

## 2011-05-22 LAB — HEPATIC FUNCTION PANEL
ALT: 34 U/L (ref 0–35)
Bilirubin, Direct: 0.2 mg/dL (ref 0.0–0.3)
Total Bilirubin: 1.2 mg/dL (ref 0.3–1.2)

## 2011-05-22 LAB — PROTIME-INR: Prothrombin Time: 26.6 s — ABNORMAL HIGH (ref 10.2–12.4)

## 2011-05-22 LAB — BRAIN NATRIURETIC PEPTIDE: Pro B Natriuretic peptide (BNP): 165 pg/mL — ABNORMAL HIGH (ref 0.0–100.0)

## 2011-05-29 ENCOUNTER — Telehealth: Payer: Self-pay | Admitting: Internal Medicine

## 2011-05-29 NOTE — Telephone Encounter (Signed)
New message Pt had questions about bisoprolol Please call

## 2011-05-29 NOTE — Telephone Encounter (Signed)
Called patient. She states that she was concerned about side effects from Bisoprolol ( SOB and fatigue) so she did not take medication on Th,Friday and Sunday. She took it on Saturday by mistake because it was in her pill box. She was taking 7.5mg  every day for palps. She states that now she feels much better without taking Bisoprolol. No complaints of palps and heart rate is about 70. If she has any palps she will go back on a low dose of Bisoprolol. Advised her that I will let Dr.Ross know about medication issue. She will call back if symptoms change.

## 2011-06-08 ENCOUNTER — Encounter: Payer: Self-pay | Admitting: Internal Medicine

## 2011-06-20 ENCOUNTER — Other Ambulatory Visit: Payer: Self-pay | Admitting: Internal Medicine

## 2011-06-21 ENCOUNTER — Telehealth: Payer: Self-pay | Admitting: Internal Medicine

## 2011-06-21 DIAGNOSIS — I499 Cardiac arrhythmia, unspecified: Secondary | ICD-10-CM

## 2011-06-21 NOTE — Telephone Encounter (Signed)
Dr. [name] calls today to order lab INR for Friday. [place] and needs [place] INR. Mylo Red RN

## 2011-06-21 NOTE — Telephone Encounter (Signed)
LMOVM order and appt placed for pt/inr. Mylo Red RN

## 2011-06-21 NOTE — Telephone Encounter (Signed)
New message:  Pt to have cath Dec 5 and she needs PT ordered for Friday.  Please call and advise patient

## 2011-06-22 NOTE — Telephone Encounter (Signed)
I spoke with patinet yesterday.  She stopped b blocker transiently and palpitations increased. She has resumed.   She will go to [facility] next week for cath with attempt to repair valve percutaneously.  If unsuccessful with have surgery.

## 2011-06-23 ENCOUNTER — Ambulatory Visit: Payer: BC Managed Care – PPO | Admitting: *Deleted

## 2011-06-23 DIAGNOSIS — Z9889 Other specified postprocedural states: Secondary | ICD-10-CM

## 2011-06-23 DIAGNOSIS — I499 Cardiac arrhythmia, unspecified: Secondary | ICD-10-CM

## 2011-06-23 LAB — PROTIME-INR
INR: 2.18 — ABNORMAL HIGH (ref <1.50)
Prothrombin Time: 25 seconds — ABNORMAL HIGH (ref 11.6–15.2)

## 2011-06-24 DIAGNOSIS — C801 Malignant (primary) neoplasm, unspecified: Secondary | ICD-10-CM

## 2011-06-24 HISTORY — DX: Malignant (primary) neoplasm, unspecified: C80.1

## 2011-06-24 HISTORY — PX: MITRAL VALVE REPLACEMENT: SHX147

## 2011-07-10 ENCOUNTER — Telehealth: Payer: Self-pay | Admitting: Internal Medicine

## 2011-07-10 NOTE — Telephone Encounter (Signed)
LMOM for call back. Need to find out if she has staples or stiches and what day they need to be removed.

## 2011-07-10 NOTE — Telephone Encounter (Signed)
New problem:  Need to speak with Jessica [name] regarding remove al stiches.

## 2011-07-11 ENCOUNTER — Other Ambulatory Visit: Payer: Self-pay | Admitting: *Deleted

## 2011-07-11 DIAGNOSIS — I2510 Atherosclerotic heart disease of native coronary artery without angina pectoris: Secondary | ICD-10-CM

## 2011-07-11 NOTE — Telephone Encounter (Signed)
F/u pt returned Jackie's call.

## 2011-07-11 NOTE — Telephone Encounter (Signed)
Pt wants to speak to [place].

## 2011-07-13 ENCOUNTER — Ambulatory Visit (INDEPENDENT_AMBULATORY_CARE_PROVIDER_SITE_OTHER): Payer: BC Managed Care – PPO | Admitting: Internal Medicine

## 2011-07-13 ENCOUNTER — Encounter: Payer: Self-pay | Admitting: Internal Medicine

## 2011-07-13 ENCOUNTER — Telehealth: Payer: Self-pay | Admitting: Internal Medicine

## 2011-07-13 ENCOUNTER — Other Ambulatory Visit: Payer: BC Managed Care – PPO | Admitting: *Deleted

## 2011-07-13 DIAGNOSIS — Z8679 Personal history of other diseases of the circulatory system: Secondary | ICD-10-CM

## 2011-07-13 DIAGNOSIS — Z954 Presence of other heart-valve replacement: Secondary | ICD-10-CM

## 2011-07-13 DIAGNOSIS — Z9889 Other specified postprocedural states: Secondary | ICD-10-CM

## 2011-07-13 DIAGNOSIS — Q249 Congenital malformation of heart, unspecified: Secondary | ICD-10-CM

## 2011-07-13 DIAGNOSIS — I059 Rheumatic mitral valve disease, unspecified: Secondary | ICD-10-CM

## 2011-07-13 DIAGNOSIS — R0602 Shortness of breath: Secondary | ICD-10-CM

## 2011-07-13 DIAGNOSIS — I499 Cardiac arrhythmia, unspecified: Secondary | ICD-10-CM

## 2011-07-13 LAB — HEPATIC FUNCTION PANEL
ALT: 91 U/L — ABNORMAL HIGH (ref 0–35)
Bilirubin, Direct: 0.5 mg/dL — ABNORMAL HIGH (ref 0.0–0.3)
Total Bilirubin: 1.2 mg/dL (ref 0.3–1.2)

## 2011-07-13 LAB — CBC WITH DIFFERENTIAL/PLATELET
Basophils Relative: 0 % (ref 0–1)
Eosinophils Absolute: 0.1 10*3/uL (ref 0.0–0.7)
HCT: 32 % — ABNORMAL LOW (ref 36.0–46.0)
Hemoglobin: 10.1 g/dL — ABNORMAL LOW (ref 12.0–15.0)
MCH: 29.2 pg (ref 26.0–34.0)
MCHC: 31.6 g/dL (ref 30.0–36.0)
MCV: 92.5 fL (ref 78.0–100.0)
Monocytes Absolute: 0.5 10*3/uL (ref 0.1–1.0)
Monocytes Relative: 7 % (ref 3–12)

## 2011-07-13 LAB — LACTATE DEHYDROGENASE: LDH: 327 U/L — ABNORMAL HIGH (ref 94–250)

## 2011-07-13 LAB — BRAIN NATRIURETIC PEPTIDE: Brain Natriuretic Peptide: 162.1 pg/mL — ABNORMAL HIGH (ref 0.0–100.0)

## 2011-07-13 LAB — BASIC METABOLIC PANEL
BUN: 28 mg/dL — ABNORMAL HIGH (ref 6–23)
Calcium: 8.9 mg/dL (ref 8.4–10.5)
Glucose, Bld: 92 mg/dL (ref 70–99)

## 2011-07-13 LAB — PROTIME-INR: INR: 1.82 — ABNORMAL HIGH (ref <1.50)

## 2011-07-13 NOTE — Progress Notes (Addendum)
HPI She has a history of AV canal defect, s/p repair early in life. Review of her records shows she underwent MV replacement initially with heterograft and then 23 mm St Jude mechanical valve.  In May 1991 she underwent modified Konno operation with a 25 mm St Jude valve in the aortic position. In 1995 she had a 23 mm Carbomedics valve placed in the mitral position for a partially obstructed valve. 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  She was just discharged from [facility] on 12/16.  She underwent her 7th sternotomy and had a MV replacement (St Jude), TV reconstruction, MACE to the L and R atria.  She also had repair of an tear to the ascending aorta.  Overall surgery took about 11 hours. Since d/c she has been tired.  Still with some chest pain.  Appetite is improving.  She says her breathing has improved.  She also says she hasn't noticed signif palpitations. Still feels puffy, abdomen is bloated No fevers, chills.  Slight d/c from low in sternal wound    Allergies  Allergen Reactions  . Povidone-Iodine     Current Outpatient Prescriptions  Medication Sig Dispense Refill  . aspirin 81 MG tablet Take 81 mg by mouth daily.        . CYMBALTA 30 MG capsule TAKE 1 CAPSULE BY MOUTH EVERY DAY  30 capsule  0  . docusate sodium (COLACE) 100 MG capsule Take 100 mg by mouth 2 (two) times daily.        . ferrous sulfate 325 (65 FE) MG tablet Take 325 mg by mouth 3 (three) times daily with meals.        . furosemide (LASIX) 40 MG tablet Take 80 mg by mouth 3 (three) times daily.        . hydrochlorothiazide 50 MG tablet TAKE 1 TABLET BY MOUTH DAILY  30 tablet  0  . norethindrone-ethinyl estradiol (ORTHO-NOVUM 1-35 TAB,NORTREL 1-35 TAB) 1-35 MG-MCG per tablet Take 1 tablet by mouth daily.        . potassium chloride SA (K-DUR,KLOR-CON) 20 MEQ tablet 40 mEq 3 (three) times daily.        Marland Kitchen spironolactone (ALDACTONE) 50 MG tablet TAKE 1 TABLET BY MOUTH DAILY  30 tablet  0  . warfarin  (COUMADIN) 4 MG tablet TAKE 1 TABLET BY MOUTH DAILY  30 tablet  0  . metoprolol tartrate (LOPRESSOR) 25 MG tablet Take 25 mg by mouth as directed.      Marland Kitchen oxyCODONE-acetaminophen (PERCOCET) 5-325 MG per tablet Take 5-325 mg by mouth as needed.        Past Medical History  Diagnosis Date  . Atrioventricular canal defect     congenital  . Migraine   . Depression   . Hepatitis C     Past Surgical History  Procedure Date  . Aorto ventricular canal defect repair     age 66 mths  . Mitral valve replacement   . Aortic valve replacement     Family History  Problem Relation Age of Onset  . Colon cancer    . Colon polyps      History   Social History  . Marital Status: Single    Spouse Name: N/A    Number of Children: N/A  . Years of Education: N/A   Occupational History  . legal specialist    Social History Main Topics  . Smoking status: Never Smoker   . Smokeless tobacco: Never Used  .  Alcohol Use: Yes     occasionally  . Drug Use: No  . Sexually Active: Not on file   Other Topics Concern  . Not on file   Social History Narrative  . No narrative on file    Review of Systems:  All systems reviewed.  They are negative to the above problem except as previously stated.  Vital Signs: BP 100/68  Ht 5\' 4"  (1.626 m)  Wt 137 lb (62.143 kg)  BMI 23.52 kg/m2  Physical Exam  Patient is in NAD  HEENT:  Normocephalic, atraumatic. EOMI, PERRLA.  Neck: JVP is mildly increased.   Lungs: clear to auscultation. No rales no wheezes.  Sternal wound:  Healing well.  No signs of infection. Heart: Regular rate and rhythm.  Crisp valve sounds.  Gr II/VI diastolic murmur at L sternal border.  Abdomen:  Supple, nontender.Slightly bloated Normal bowel sounds. No masses. No hepatomegaly.  Extremities:   Good distal pulses throughout. No lower extremity edema.   EKG:  Sinus rhythm.  RBBB.  LAFB.  T wave inversion I, AVL, V1-V4.  Low voltage P waves.     Assessment and  Plan:

## 2011-07-13 NOTE — Telephone Encounter (Signed)
New message;  Pt has appt this pm and needs to have CBC and Potassium added to her labs.  Info only

## 2011-07-13 NOTE — Telephone Encounter (Signed)
Will complete labs at visit.

## 2011-07-19 ENCOUNTER — Telehealth: Payer: Self-pay | Admitting: Internal Medicine

## 2011-07-19 NOTE — Telephone Encounter (Signed)
Pt recently had open heart surgery and id requesting you contact her. Pt also requesting appt with Dr. Shela Commons within the next 2 weeks. Please advise

## 2011-07-20 ENCOUNTER — Ambulatory Visit (INDEPENDENT_AMBULATORY_CARE_PROVIDER_SITE_OTHER): Payer: BC Managed Care – PPO | Admitting: Internal Medicine

## 2011-07-20 ENCOUNTER — Encounter: Payer: Self-pay | Admitting: Internal Medicine

## 2011-07-20 ENCOUNTER — Telehealth: Payer: Self-pay | Admitting: *Deleted

## 2011-07-20 VITALS — BP 108/68 | HR 64 | Ht 64.5 in | Wt 131.8 lb

## 2011-07-20 DIAGNOSIS — Q249 Congenital malformation of heart, unspecified: Secondary | ICD-10-CM

## 2011-07-20 DIAGNOSIS — Z8679 Personal history of other diseases of the circulatory system: Secondary | ICD-10-CM

## 2011-07-20 LAB — CBC WITH DIFFERENTIAL/PLATELET
Basophils Relative: 0.5 % (ref 0.0–3.0)
Eosinophils Relative: 2.8 % (ref 0.0–5.0)
Hemoglobin: 10.2 g/dL — ABNORMAL LOW (ref 12.0–15.0)
Lymphocytes Relative: 11.3 % — ABNORMAL LOW (ref 12.0–46.0)
MCV: 91.1 fl (ref 78.0–100.0)
Monocytes Absolute: 0.4 10*3/uL (ref 0.1–1.0)
Neutrophils Relative %: 76.8 % (ref 43.0–77.0)
RBC: 3.41 Mil/uL — ABNORMAL LOW (ref 3.87–5.11)
WBC: 5.1 10*3/uL (ref 4.5–10.5)

## 2011-07-20 LAB — BASIC METABOLIC PANEL
CO2: 29 mEq/L (ref 19–32)
GFR: 82.29 mL/min (ref >60.00)
Glucose, Bld: 91 mg/dL (ref 70–99)
Potassium: 3.3 mEq/L — ABNORMAL LOW (ref 3.5–5.1)
Sodium: 136 mEq/L (ref 135–145)

## 2011-07-20 LAB — PROTIME-INR: INR: 7.2 ratio (ref 0.8–1.0)

## 2011-07-20 NOTE — Telephone Encounter (Signed)
Call from Clydie Braun at the [employer]- INR- [phone]. I have relayed this to Carlyon Shadow, RN for Dr. [name]. I will forward phone note to Dr. [name] and Annice Pih for documentation.

## 2011-07-20 NOTE — Telephone Encounter (Signed)
Ov given for 12-28=-pt aware

## 2011-07-20 NOTE — Progress Notes (Addendum)
HPI  I saw Jessica [name] 1 week ago.She has a history of AV canal defect, s/p repair early in life. Review of her records shows she underwent MV replacement initially with heterograft and then 23 mm St Jude mechanical valve.  In May 1991 she underwent modified Konno operation with a 25 mm St Jude valve in the aortic position. In 1995 she had a 23 mm Carbomedics valve placed in the mitral position for a partially obstructed valve. 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 All surgeries were done at [facility].  She was just discharged from [facility] on 12/16.  She underwent MV replacement (3rd) with St Jude prosthesis.  She also had TV reconstruction, MAZE procedure, isthmus ablation and reconstruction of her tricuspid valve..  Also had repair of ascending aortic tear.  (this was patient's 7th stenotomy). I saw her last week.  She appeared a little volume overloaded but overall looked good Since seen she has continued her meds.  Denies fever.  No palpitaitons.  Breathing is pretty good.  Says she is still 5 lbs up from dry weight.  Allergies       Allergies  Allergen Reactions  . Povidone-Iodine     Current Outpatient Prescriptions  Medication Sig Dispense Refill  . aspirin 81 MG tablet Take 81 mg by mouth daily.        . CYMBALTA 30 MG capsule TAKE 1 CAPSULE BY MOUTH EVERY DAY  30 capsule  0  . docusate sodium (COLACE) 100 MG capsule Take 100 mg by mouth 2 (two) times daily.        . ferrous sulfate 325 (65 FE) MG tablet Take 325 mg by mouth 3 (three) times daily with meals.        . furosemide (LASIX) 40 MG tablet Take 80 mg by mouth daily.       . furosemide (LASIX) 40 MG tablet Take 40 mg by mouth at bedtime.        . hydrochlorothiazide 50 MG tablet TAKE 1 TABLET BY MOUTH DAILY  30 tablet  0  . methocarbamol (ROBAXIN) 500 MG tablet Take 500 mg by mouth as needed.       . metoprolol tartrate (LOPRESSOR) 25 MG tablet Take 25 mg by mouth as directed.      . norethindrone-ethinyl estradiol  (ORTHO-NOVUM 1-35 TAB,NORTREL 1-35 TAB) 1-35 MG-MCG per tablet Take 1 tablet by mouth daily.        Marland Kitchen oxyCODONE-acetaminophen (PERCOCET) 5-325 MG per tablet Take 5-325 mg by mouth as needed.      . potassium chloride SA (K-DUR,KLOR-CON) 20 MEQ tablet 40 mEq daily. 40 meq morning 20 meq a night       . spironolactone (ALDACTONE) 50 MG tablet TAKE 1 TABLET BY MOUTH DAILY  30 tablet  0  . warfarin (COUMADIN) 4 MG tablet TAKE 1 TABLET BY MOUTH DAILY  30 tablet  0  . zaleplon (SONATA) 10 MG capsule Take 10 mg by mouth as needed.         Past Medical History  Diagnosis Date  . Atrioventricular canal defect     congenital  . Migraine   . Depression   . Hepatitis C     Past Surgical History  Procedure Date  . Aorto ventricular canal defect repair     age 35 mths  . Mitral valve replacement   . Aortic valve replacement     Family History  Problem Relation Age of Onset  . Colon  cancer    . Colon polyps      History   Social History  . Marital Status: Single    Spouse Name: N/A    Number of Children: N/A  . Years of Education: N/A   Occupational History  . legal specialist    Social History Main Topics  . Smoking status: Never Smoker   . Smokeless tobacco: Never Used  . Alcohol Use: Yes     occasionally  . Drug Use: No  . Sexually Active: Not on file   Other Topics Concern  . Not on file   Social History Narrative  . No narrative on file    Review of Systems:  All systems reviewed.  They are negative to the above problem except as previously stated.  Vital Signs: BP 108/68  Pulse 64  Ht 5' 4.5" (1.638 m)  Wt 131 lb 12.8 oz (59.784 kg)  BMI 22.27 kg/m2  Physical Exam  Patient is in NAD HEENT:  Normocephalic, atraumatic. EOMI, PERRLA.  Neck: JVP is normal. No thyromegaly. No bruits.  Lungs: clear to auscultation. No rales no wheezes.  Heart: Regular rate and rhythm. Crisp valve sounds.  Gr II/VI diastolic murmur LUSB. Chest:  Sternal wound healing well.  1  stitch (removed)  Abdomen:  Supple, nontender. Normal bowel sound.  Sl distended.  No hepatomegaly.  Extremities:   Good distal pulses throughout. No lower extremity edema.  Musculoskeletal :moving all extremities.    Assessment and Plan:

## 2011-07-20 NOTE — Telephone Encounter (Signed)
See Dr.Ross note. Patient coming back for more lab work and Coumadin clinic visit on 12/31.

## 2011-07-21 ENCOUNTER — Other Ambulatory Visit: Payer: Self-pay

## 2011-07-21 ENCOUNTER — Ambulatory Visit (INDEPENDENT_AMBULATORY_CARE_PROVIDER_SITE_OTHER): Payer: BC Managed Care – PPO | Admitting: Internal Medicine

## 2011-07-21 ENCOUNTER — Other Ambulatory Visit: Payer: BC Managed Care – PPO | Admitting: *Deleted

## 2011-07-21 ENCOUNTER — Encounter: Payer: Self-pay | Admitting: Internal Medicine

## 2011-07-21 VITALS — BP 110/72 | HR 56 | Temp 98.1°F | Resp 14 | Ht 64.5 in | Wt 132.0 lb

## 2011-07-21 DIAGNOSIS — T45511A Poisoning by anticoagulants, accidental (unintentional), initial encounter: Secondary | ICD-10-CM

## 2011-07-21 DIAGNOSIS — E538 Deficiency of other specified B group vitamins: Secondary | ICD-10-CM

## 2011-07-21 DIAGNOSIS — F411 Generalized anxiety disorder: Secondary | ICD-10-CM

## 2011-07-21 DIAGNOSIS — D599 Acquired hemolytic anemia, unspecified: Secondary | ICD-10-CM

## 2011-07-21 DIAGNOSIS — Q249 Congenital malformation of heart, unspecified: Secondary | ICD-10-CM

## 2011-07-21 DIAGNOSIS — Z954 Presence of other heart-valve replacement: Secondary | ICD-10-CM

## 2011-07-21 DIAGNOSIS — Z952 Presence of prosthetic heart valve: Secondary | ICD-10-CM

## 2011-07-21 DIAGNOSIS — I509 Heart failure, unspecified: Secondary | ICD-10-CM

## 2011-07-21 DIAGNOSIS — Z9889 Other specified postprocedural states: Secondary | ICD-10-CM

## 2011-07-21 LAB — HEPATIC FUNCTION PANEL
ALT: 41 U/L — ABNORMAL HIGH (ref 0–35)
AST: 34 U/L (ref 0–37)
Albumin: 3.6 g/dL (ref 3.5–5.2)
Alkaline Phosphatase: 151 U/L — ABNORMAL HIGH (ref 39–117)
Bilirubin, Direct: 0.3 mg/dL (ref 0.0–0.3)
Total Protein: 7.5 g/dL (ref 6.0–8.3)

## 2011-07-21 LAB — CBC WITH DIFFERENTIAL/PLATELET
Basophils Absolute: 0 10*3/uL (ref 0.0–0.1)
Eosinophils Relative: 2.4 % (ref 0.0–5.0)
HCT: 31.1 % — ABNORMAL LOW (ref 36.0–46.0)
Lymphocytes Relative: 13.4 % (ref 12.0–46.0)
Lymphs Abs: 0.7 10*3/uL (ref 0.7–4.0)
Monocytes Relative: 8.2 % (ref 3.0–12.0)
Neutrophils Relative %: 75.7 % (ref 43.0–77.0)
Platelets: 358 10*3/uL (ref 150.0–400.0)
WBC: 4.9 10*3/uL (ref 4.5–10.5)

## 2011-07-21 LAB — BASIC METABOLIC PANEL
CO2: 31 mEq/L (ref 19–32)
Chloride: 98 mEq/L (ref 96–112)
Glucose, Bld: 99 mg/dL (ref 70–99)
Potassium: 3.6 mEq/L (ref 3.5–5.1)
Sodium: 138 mEq/L (ref 135–145)

## 2011-07-21 LAB — PROTIME-INR
INR: 6.4 ratio (ref 0.8–1.0)
Prothrombin Time: 71.9 s (ref 10.2–12.4)

## 2011-07-21 MED ORDER — POTASSIUM CHLORIDE ER 10 MEQ PO TBCR: 20.0000 meq | EXTENDED_RELEASE_TABLET | Freq: Two times a day (BID) | ORAL | Status: DC

## 2011-07-21 NOTE — Assessment & Plan Note (Addendum)
Jessica [name] continues to recover nicely.  Volume appears to be up slightly.  Some of her wt gain may be nonfluid.   I would recomm checking labs today to reassess Lasix needs. Just got records from [facility]   Will review.  Need to seen when echo done as baseline. Increase activity as tolerated.  Will set up in cardiac rehab.

## 2011-07-21 NOTE — Progress Notes (Signed)
Subjective:    Patient ID: Jessica [name], female    DOB: 09-27-1975, 35 y.o.   MRN: [id number]  HPI  The patient is a 35 year old white female who presents in followup of a recent hospitalization in [place] she was born with complete AV canal disease and is status post multiple stent sternotomies for years related to her mitral she has a pair prosthetic mitral valve leak that was repaired with a mitral valve replacement.  This was the third mitral valve replacement this time with a 25 mm St. Jude prosthesis in the seventh sternotomy.  During this procedure they also did tricuspid valve reconstruction and a left sided atrial  MACE procedure. She presents for followup following that procedure and for appropriate blood work including a CBC differential liver functions and monitoring of her anticoagulation on Coumadin.  Apparently yesterday her protime was normal today her protime is markedly elevated therefore a PT/INR will be sent to the lab for confirmation and the appropriate action will be taken based upon the level proceed from the valve.  She was seen yesterday by Dr. Huston Foley her cardiologist who believes she had some mild volume overload otherwise was stable  Review of Systems  Constitutional: Negative for activity change, appetite change and fatigue.  HENT: Negative for ear pain, congestion, neck pain, postnasal drip and sinus pressure.   Eyes: Negative for redness and visual disturbance.  Respiratory: Positive for shortness of breath. Negative for cough and wheezing.   Gastrointestinal: Negative for abdominal pain and abdominal distention.  Genitourinary: Negative for dysuria, frequency and menstrual problem.  Musculoskeletal: Negative for myalgias, joint swelling and arthralgias.  Skin: Positive for wound. Negative for rash.       sternotomy  Neurological: Positive for weakness. Negative for dizziness and headaches.  Hematological: Negative for adenopathy. Bruises/bleeds easily.    Psychiatric/Behavioral: Negative for sleep disturbance and decreased concentration. The patient is nervous/anxious.        Past Medical History  Diagnosis Date  . Atrioventricular canal defect     congenital  . Migraine   . Depression   . Hepatitis C   . Atrioventricular canal (AVC), complete     Has had 7 sternotomies for repair of this  . Acute on chronic heart failure     periprosthetic mitral valve leak    History   Social History  . Marital Status: Single    Spouse Name: N/A    Number of Children: N/A  . Years of Education: N/A   Occupational History  . legal specialist    Social History Main Topics  . Smoking status: Never Smoker   . Smokeless tobacco: Never Used  . Alcohol Use: Yes     occasionally  . Drug Use: No  . Sexually Active: Yes   Other Topics Concern  . Not on file   Social History Narrative  . No narrative on file    Past Surgical History  Procedure Date  . Aorto ventricular canal defect repair     age 38 mths  . Mitral valve replacement 06/2011    Third mitral valve replacement  . Aortic valve replacement   . Sternotomy     She has had 7 sternotomy    Family History  Problem Relation Age of Onset  . Colon cancer    . Colon polyps    . Hyperlipidemia Mother   . Depression Mother   . Hyperlipidemia Father   . Hypertension Father     Allergies  Allergen  Reactions  . Povidone-Iodine     Current Outpatient Prescriptions on File Prior to Visit  Medication Sig Dispense Refill  . aspirin 81 MG tablet Take 81 mg by mouth daily.        . CYMBALTA 30 MG capsule TAKE 1 CAPSULE BY MOUTH EVERY DAY  30 capsule  0  . docusate sodium (COLACE) 100 MG capsule Take 100 mg by mouth 2 (two) times daily.        . ferrous sulfate 325 (65 FE) MG tablet Take 325 mg by mouth 3 (three) times daily with meals.        . furosemide (LASIX) 40 MG tablet Take 80 mg by mouth daily.       . hydrochlorothiazide 50 MG tablet TAKE 1 TABLET BY MOUTH DAILY  30  tablet  0  . methocarbamol (ROBAXIN) 500 MG tablet Take 500 mg by mouth as needed.       . metoprolol tartrate (LOPRESSOR) 25 MG tablet Take 12.5 mg by mouth 2 (two) times daily.       . norethindrone-ethinyl estradiol (ORTHO-NOVUM 1-35 TAB,NORTREL 1-35 TAB) 1-35 MG-MCG per tablet Take 1 tablet by mouth daily.        Marland Kitchen oxyCODONE-acetaminophen (PERCOCET) 5-325 MG per tablet Take 5-325 mg by mouth as needed.      . potassium chloride SA (K-DUR,KLOR-CON) 20 MEQ tablet Take 40 mEq by mouth daily.       Marland Kitchen spironolactone (ALDACTONE) 50 MG tablet TAKE 1 TABLET BY MOUTH DAILY  30 tablet  0  . warfarin (COUMADIN) 4 MG tablet TAKE 1 TABLET BY MOUTH DAILY  30 tablet  0  . zaleplon (SONATA) 10 MG capsule Take 10 mg by mouth as needed.         BP 110/72  Pulse 56  Temp 98.1 F (36.7 C)  Resp 14  Ht 5' 4.5" (1.638 m)  Wt 132 lb (59.875 kg)  BMI 22.31 kg/m2    Objective:   Physical Exam  Nursing note and vitals reviewed. Constitutional: She is oriented to person, place, and time. She appears well-developed and well-nourished.  Eyes: Conjunctivae are normal. Pupils are equal, round, and reactive to light.  Neck: Neck supple.  Pulmonary/Chest: Effort normal and breath sounds normal.  Abdominal: Soft. Bowel sounds are normal.  Musculoskeletal: Normal range of motion.  Neurological: She is alert and oriented to person, place, and time.  Skin: Skin is warm and dry. There is pallor.          Assessment & Plan:  Post procedure on 80 of lasix, HCTZ 50 and spironolactone 50 mg as well as potassium The potassium pill is very large and difficult to swallow I agree with her cardiologist suggest some mild persistent volume overload post surgery but hopefully she will rapidly recover part remodeling and her need for this amount of diuretic will decrease we'll monitor her closely over this period of time both her weight and creatinine BUN.  I am also concerned postoperatively she has had passive liver  congestion before with its effect on her ability to create clotting proteins this may be why the moderate increase in Coumadin resulted in such a significant increase in INR we will check her INR today if her INR is rapidly dropping she'll resume Coumadin on Sunday if her INR slowly dropping she will have a INR check on Monday with titration of her Coumadin as indicated.  We spent over 30 minutes face-to-face with the patient the patient's mother.

## 2011-07-21 NOTE — Patient Instructions (Signed)
The patient is instructed to continue all medications as prescribed. Schedule followup with check out clerk upon leaving the clinic  

## 2011-07-23 NOTE — Assessment & Plan Note (Signed)
Patient is just back from [place].  Seems to have tolerated the procedure well.  Symptoms have already improved.  She seems a little volume overloaded on exam Wil check labs.  Continue lasix.

## 2011-07-23 NOTE — Assessment & Plan Note (Signed)
Check INR today

## 2011-07-23 NOTE — Assessment & Plan Note (Signed)
Symptoms have improved.

## 2011-07-23 NOTE — Assessment & Plan Note (Signed)
Check INR 

## 2011-07-24 ENCOUNTER — Other Ambulatory Visit: Payer: Self-pay

## 2011-07-24 ENCOUNTER — Ambulatory Visit (INDEPENDENT_AMBULATORY_CARE_PROVIDER_SITE_OTHER): Payer: BC Managed Care – PPO | Admitting: *Deleted

## 2011-07-24 ENCOUNTER — Other Ambulatory Visit (INDEPENDENT_AMBULATORY_CARE_PROVIDER_SITE_OTHER): Payer: BC Managed Care – PPO | Admitting: *Deleted

## 2011-07-24 DIAGNOSIS — E785 Hyperlipidemia, unspecified: Secondary | ICD-10-CM

## 2011-07-24 DIAGNOSIS — Z954 Presence of other heart-valve replacement: Secondary | ICD-10-CM

## 2011-07-24 DIAGNOSIS — Q249 Congenital malformation of heart, unspecified: Secondary | ICD-10-CM

## 2011-07-24 DIAGNOSIS — Z952 Presence of prosthetic heart valve: Secondary | ICD-10-CM

## 2011-07-24 DIAGNOSIS — Z9889 Other specified postprocedural states: Secondary | ICD-10-CM

## 2011-07-24 LAB — BASIC METABOLIC PANEL
CO2: 28 mEq/L (ref 19–32)
Calcium: 9.1 mg/dL (ref 8.4–10.5)
Chloride: 97 mEq/L (ref 96–112)
Creatinine, Ser: 0.9 mg/dL (ref 0.4–1.2)
Glucose, Bld: 112 mg/dL — ABNORMAL HIGH (ref 70–99)
Sodium: 135 mEq/L (ref 135–145)

## 2011-07-24 NOTE — Telephone Encounter (Signed)
Left message

## 2011-07-24 NOTE — Telephone Encounter (Signed)
Fu call °Pt returning your call  °

## 2011-07-26 NOTE — Telephone Encounter (Signed)
FU Call: pt calling to check on status of pt need to FU with cardiac rehab. Please return pt call to discuss further.

## 2011-07-26 NOTE — Telephone Encounter (Signed)
Called patient back. She is aware that cardiac rehab unit at [facility] is currently closed for renovation. MD order was faxed last week. Advised they should call her next week to set up orientation.

## 2011-07-27 ENCOUNTER — Ambulatory Visit (INDEPENDENT_AMBULATORY_CARE_PROVIDER_SITE_OTHER): Payer: BC Managed Care – PPO | Admitting: *Deleted

## 2011-07-27 DIAGNOSIS — I059 Rheumatic mitral valve disease, unspecified: Secondary | ICD-10-CM

## 2011-07-27 DIAGNOSIS — I4891 Unspecified atrial fibrillation: Secondary | ICD-10-CM

## 2011-07-27 DIAGNOSIS — I824Y9 Acute embolism and thrombosis of unspecified deep veins of unspecified proximal lower extremity: Secondary | ICD-10-CM

## 2011-07-27 DIAGNOSIS — Z954 Presence of other heart-valve replacement: Secondary | ICD-10-CM

## 2011-07-27 DIAGNOSIS — I359 Nonrheumatic aortic valve disorder, unspecified: Secondary | ICD-10-CM

## 2011-07-27 DIAGNOSIS — Q249 Congenital malformation of heart, unspecified: Secondary | ICD-10-CM

## 2011-07-27 DIAGNOSIS — D6859 Other primary thrombophilia: Secondary | ICD-10-CM | POA: Insufficient documentation

## 2011-07-27 DIAGNOSIS — Z9889 Other specified postprocedural states: Secondary | ICD-10-CM

## 2011-07-30 ENCOUNTER — Other Ambulatory Visit: Payer: Self-pay | Admitting: Internal Medicine

## 2011-07-31 ENCOUNTER — Encounter: Payer: BC Managed Care – PPO | Admitting: *Deleted

## 2011-08-08 ENCOUNTER — Telehealth: Payer: Self-pay | Admitting: Internal Medicine

## 2011-08-08 NOTE — Telephone Encounter (Signed)
Called patient. She is feeling well. Wanted to know if she needed any lab work. Has Coumadin clinic visit on 1/17. Advised will ask Dr.Ross and call her back only if she wants any lab work to be ordered.

## 2011-08-08 NOTE — Telephone Encounter (Signed)
New msg Pt is coming on Thursday for coumadin and she wants to know if she need other blood work

## 2011-08-10 ENCOUNTER — Encounter (HOSPITAL_COMMUNITY)
Admission: RE | Admit: 2011-08-10 | Discharge: 2011-08-10 | Disposition: A | Discharge status: Home / Self Care | Payer: BC Managed Care – PPO | Source: Ambulatory Visit | Attending: Internal Medicine | Admitting: Internal Medicine

## 2011-08-10 ENCOUNTER — Ambulatory Visit (INDEPENDENT_AMBULATORY_CARE_PROVIDER_SITE_OTHER): Payer: BC Managed Care – PPO | Admitting: *Deleted

## 2011-08-10 ENCOUNTER — Encounter (HOSPITAL_COMMUNITY): Payer: Self-pay

## 2011-08-10 DIAGNOSIS — I499 Cardiac arrhythmia, unspecified: Secondary | ICD-10-CM | POA: Insufficient documentation

## 2011-08-10 DIAGNOSIS — I4891 Unspecified atrial fibrillation: Secondary | ICD-10-CM | POA: Insufficient documentation

## 2011-08-10 DIAGNOSIS — I059 Rheumatic mitral valve disease, unspecified: Secondary | ICD-10-CM | POA: Insufficient documentation

## 2011-08-10 DIAGNOSIS — F411 Generalized anxiety disorder: Secondary | ICD-10-CM | POA: Insufficient documentation

## 2011-08-10 DIAGNOSIS — I359 Nonrheumatic aortic valve disorder, unspecified: Secondary | ICD-10-CM | POA: Insufficient documentation

## 2011-08-10 DIAGNOSIS — Q249 Congenital malformation of heart, unspecified: Secondary | ICD-10-CM

## 2011-08-10 DIAGNOSIS — E785 Hyperlipidemia, unspecified: Secondary | ICD-10-CM | POA: Insufficient documentation

## 2011-08-10 DIAGNOSIS — Z5189 Encounter for other specified aftercare: Secondary | ICD-10-CM | POA: Insufficient documentation

## 2011-08-10 DIAGNOSIS — Z954 Presence of other heart-valve replacement: Secondary | ICD-10-CM | POA: Insufficient documentation

## 2011-08-10 DIAGNOSIS — Z9889 Other specified postprocedural states: Secondary | ICD-10-CM

## 2011-08-10 DIAGNOSIS — D599 Acquired hemolytic anemia, unspecified: Secondary | ICD-10-CM | POA: Insufficient documentation

## 2011-08-10 DIAGNOSIS — I824Y9 Acute embolism and thrombosis of unspecified deep veins of unspecified proximal lower extremity: Secondary | ICD-10-CM

## 2011-08-10 DIAGNOSIS — D6859 Other primary thrombophilia: Secondary | ICD-10-CM

## 2011-08-10 LAB — POCT INR: INR: 3.9

## 2011-08-11 ENCOUNTER — Telehealth: Payer: Self-pay | Admitting: Internal Medicine

## 2011-08-11 ENCOUNTER — Other Ambulatory Visit: Payer: Self-pay | Admitting: *Deleted

## 2011-08-11 DIAGNOSIS — E785 Hyperlipidemia, unspecified: Secondary | ICD-10-CM

## 2011-08-11 NOTE — Telephone Encounter (Signed)
Labs ordered and scheduled for 08/30/2011 after coumadin clinic visit.  Pt aware.

## 2011-08-11 NOTE — Telephone Encounter (Signed)
Spoke with patient  Feeling good.  Breathing is good. Just started cardiac rehab. Fluid status from her perspective is stable.  She has tried on her own to cut back on Lasix to 20 which was her preop dose.  She felt more bloated when she did this.  Back on 40 plus aldactone and HCTZ Labls were good 2 wks ago. When she comes back in 3 wks will get a BMET, BNP

## 2011-08-14 ENCOUNTER — Encounter (HOSPITAL_COMMUNITY)
Admission: RE | Admit: 2011-08-14 | Discharge: 2011-08-14 | Disposition: A | Discharge status: Home / Self Care | Payer: BC Managed Care – PPO | Source: Ambulatory Visit | Attending: Internal Medicine | Admitting: Internal Medicine

## 2011-08-14 NOTE — Telephone Encounter (Signed)
Labs already scheduled by triage nurse for 2/6 and documented that patient is aware.

## 2011-08-14 NOTE — Progress Notes (Signed)
Pt started cardiac rehab today.  Pt tolerated light exercise without difficulty. However pt did have some difficulty with the nustep.  Pt rated this as hard on a level 1. Pt reports that the elliptical for home exercise was also difficult.  Advised pt to continue with walking on level surface until continued evaluation of exercise tolerance can be determined.  Montior shows regular rhythm with Right bundle branch block with no discernable p waves noted.  Will scan rhythmn strip for review by Dr. [name]. Continue to monitor

## 2011-08-14 NOTE — Telephone Encounter (Signed)
I have put in that she have a BMET and BNP when she returns for next INR.  Please make sure that this is in system.

## 2011-08-15 NOTE — Progress Notes (Signed)
Pt attended the Nutrition 1 Heart Healthy class today.

## 2011-08-15 NOTE — Progress Notes (Signed)
Jessica [name] 36 y.o. female       Nutrition Screen                                                                    YES  NO Do you live in a nursing home?  X   Do you eat out more than 3 times/week?    X If yes, how many times per week do you eat out?  Do you have food allergies?   X If yes, what are you allergic to?  Have you gained or lost more than 10 lbs without trying?               X If yes, how much weight have you lost and over what time period?  lbs gained or lost over  weeks/month  Do you want to lose weight?     X If yes, what is a goal weight or amount of weight you would like to lose?  Do you eat alone most of the time?   X   Do you eat less than 2 meals/day?  X If yes, how many meals do you eat?  Do you drink more than 3 alcohol drinks/day?  X If yes, how many drinks per day?  Are you having trouble with constipation? *  X If yes, what are you doing to help relieve constipation?  Do you have financial difficulties with buying food?*    X   Are you experiencing regular nausea/ vomiting?*     X   Do you have a poor appetite? *                                        X   Do you have trouble chewing/swallowing? *   X    Pt with diagnoses of:  X Stent/ PTCA X MVR     X Dyslipidemia  / HDL< 40 / LDL>70 / High TG      X %  Body fat >goal / Body Mass Index >25       Pt Risk Score    0       Diagnosis Risk Score  15       Total Risk Score   15                         High Risk               X Low Risk              HT: 65.25" Ht Readings from Last 1 Encounters:  08/10/11 5' 5.25" (1.657 m)    WT:   131.1 lb (59.6 kg) Wt Readings from Last 3 Encounters:  08/10/11 131 lb 6.3 oz (59.6 kg)  07/21/11 132 lb (59.875 kg)  07/20/11 131 lb 12.8 oz (59.784 kg)     IBW 57.4 104%IBW BMI 21.7 30.1%body fat   Meds reviewed: Lasix, Hydrochlorothiazide, Potassium Chloride, Warfarin, Ferrous Sulfate  Past Medical History  Diagnosis Date  . Atrioventricular canal defect    congenital  . Migraine   . Depression   .  Hepatitis C   . Atrioventricular canal (AVC), complete     Has had 7 sternotomies for repair of this  . Acute on chronic heart failure     periprosthetic mitral valve leak        Activity level: Pt is sedentary  Wt goal: 131 lb ( 59.6 kg) Current tobacco use? No      Food/Drug Interaction? Yes If Y, which med(s)? Warfarin If yes, pt given Food/Drug Interaction handout?  Yes  Labs:  Lipid Panel     Component Value Date/Time   CHOL 178 11/16/2009 0849   TRIG 117.0 11/16/2009 0849   HDL 37.00* 11/16/2009 0849   CHOLHDL 5 11/16/2009 0849   VLDL 23.4 11/16/2009 0849   LDLCALC 118* 11/16/2009 0849   No results found for this basename: HGBA1C   07/24/11 Glucose 112   LDL goal: < 100   Estimated Daily Nutrition Needs for: ? wt maintenance  1800-2100 Kcal , Total Fat 60-70gm, Saturated Fat 14-16 gm, Trans Fat 2.0-2.3 gm,  Sodium less than 1500 mg

## 2011-08-16 ENCOUNTER — Other Ambulatory Visit: Payer: Self-pay

## 2011-08-16 ENCOUNTER — Encounter: Payer: Self-pay | Admitting: *Deleted

## 2011-08-16 ENCOUNTER — Telehealth: Payer: Self-pay | Admitting: *Deleted

## 2011-08-16 ENCOUNTER — Ambulatory Visit (HOSPITAL_COMMUNITY)
Admission: RE | Admit: 2011-08-16 | Discharge: 2011-08-16 | Disposition: A | Discharge status: Home / Self Care | Payer: BC Managed Care – PPO | Source: Ambulatory Visit | Attending: Internal Medicine | Admitting: Internal Medicine

## 2011-08-16 ENCOUNTER — Encounter (HOSPITAL_COMMUNITY): Admission: RE | Admit: 2011-08-16 | Payer: BC Managed Care – PPO | Source: Ambulatory Visit

## 2011-08-16 ENCOUNTER — Other Ambulatory Visit (INDEPENDENT_AMBULATORY_CARE_PROVIDER_SITE_OTHER): Payer: BC Managed Care – PPO | Admitting: *Deleted

## 2011-08-16 ENCOUNTER — Other Ambulatory Visit: Payer: Self-pay | Admitting: *Deleted

## 2011-08-16 DIAGNOSIS — I2510 Atherosclerotic heart disease of native coronary artery without angina pectoris: Secondary | ICD-10-CM

## 2011-08-16 DIAGNOSIS — I4892 Unspecified atrial flutter: Secondary | ICD-10-CM

## 2011-08-16 DIAGNOSIS — E785 Hyperlipidemia, unspecified: Secondary | ICD-10-CM

## 2011-08-16 LAB — BASIC METABOLIC PANEL
CO2: 30 mEq/L (ref 19–32)
Glucose, Bld: 89 mg/dL (ref 70–99)
Potassium: 4.1 mEq/L (ref 3.5–5.1)
Sodium: 137 mEq/L (ref 135–145)

## 2011-08-16 NOTE — Progress Notes (Signed)
Cardiac Rehab Outpatient - 645 Pt in this morning for exercise.  Pt noted to be in sustained tachycardia rate 130's .  Pt denies any symptoms and reports feeling a fast heart rate since Monday afternoon. 12 lead ekg obtained to determine the interpretation of the rhythm.  [name] on call PA paged by the answering service for further intervention @ [phone].  No return call in 10 minutes, PA repaged by answering service at [phone].  0745 answering service called rehab to see if the PA had responded to previous pages.  Answering service informed that I had not heard from on call PA.  Dr.  [name] paged per answering service.  Pt requested Dr. [name] be called on the number given to pt by Dr. [name].  Left message on voicemail regarding the situation.  Dr. [name] returned call and updated MD regarding the situation.  Dr.  [name] will have [name] come to the rehab area for further assessment.   Dr. [name] returned page.  Informed Dr. [name] that I had talked to Dr. [name]. [name] arrived and strips given to her for review.  [name] showed strips to Dr. [name] and Dr. [name].  [name] returned to rehab and advised pt that she was ok to leave rehab and that further consultation would be needed with her physician at [facility].  Pt verbalized understanding and remains asymptomatic.

## 2011-08-16 NOTE — Progress Notes (Signed)
Patient was in cardiac rehab this morning, found to be in narrow complex tachycardia at a rate of 134bpm.  Cardiac rehab called Dr [name].  EKG obtained and evaluated by Dr [name].  He felt patient was in atypical flutter.  Patient is asymptomatic.  No chest pain, shortness of breath, pre-syncope, or syncope.  BP stable.    Dr [name] recommended cardioversion.  Patient is on Coumadin and has been therapeutic per her report for the past 3 weeks.  Patient uncomfortable with any procedures until her physicians at [facility] have been consulted.  She states that she had an EPS and attempted ablation recently at [facility] but they were unable to ablate her tachycardias.  Discussed with Dr [name] and patient.  Dr [name] will call patient's physicians at [facility] to determine plan.  Patient was in cardiac rehab on Monday and was in sinus rhythm.  EKG and rhythm strips faxed to [facility] per Dr Charlott Rakes request.    Patient aware to call the office for any symptoms that develop.  Dr [name] will call patient after discussing case with [facility].  Patient to call the office if she hasn't heard from Dr [name] or Annice Pih by lunch today to determine plan.  Gypsy Balsam, RN, BSN 08/16/2011 8:39 AM

## 2011-08-16 NOTE — Telephone Encounter (Signed)
Pt scheduled for cardioversion 1/24. Scheduling called and pt needs to be at hosp 10:00am instead of 9am Pt aware of time change and she is aware of todays lab results at her request Mylo Red RN

## 2011-08-17 ENCOUNTER — Ambulatory Visit (HOSPITAL_COMMUNITY)
Admission: RE | Admit: 2011-08-17 | Discharge: 2011-08-17 | Disposition: A | Discharge status: Home / Self Care | Payer: BC Managed Care – PPO | Source: Ambulatory Visit | Attending: Cardiology | Admitting: Cardiology

## 2011-08-17 ENCOUNTER — Other Ambulatory Visit: Payer: Self-pay

## 2011-08-17 ENCOUNTER — Encounter (HOSPITAL_COMMUNITY)
Admission: RE | Disposition: A | Discharge status: Home / Self Care | Payer: Self-pay | Source: Ambulatory Visit | Attending: Cardiology

## 2011-08-17 ENCOUNTER — Encounter (HOSPITAL_COMMUNITY): Payer: Self-pay | Admitting: Anesthesiology

## 2011-08-17 ENCOUNTER — Ambulatory Visit (HOSPITAL_COMMUNITY): Payer: BC Managed Care – PPO | Admitting: Anesthesiology

## 2011-08-17 ENCOUNTER — Telehealth: Payer: Self-pay | Admitting: Internal Medicine

## 2011-08-17 ENCOUNTER — Ambulatory Visit (INDEPENDENT_AMBULATORY_CARE_PROVIDER_SITE_OTHER): Payer: BC Managed Care – PPO | Admitting: Internal Medicine

## 2011-08-17 DIAGNOSIS — I359 Nonrheumatic aortic valve disorder, unspecified: Secondary | ICD-10-CM

## 2011-08-17 DIAGNOSIS — Z954 Presence of other heart-valve replacement: Secondary | ICD-10-CM

## 2011-08-17 DIAGNOSIS — I4891 Unspecified atrial fibrillation: Secondary | ICD-10-CM

## 2011-08-17 DIAGNOSIS — I824Y9 Acute embolism and thrombosis of unspecified deep veins of unspecified proximal lower extremity: Secondary | ICD-10-CM

## 2011-08-17 DIAGNOSIS — D6859 Other primary thrombophilia: Secondary | ICD-10-CM

## 2011-08-17 DIAGNOSIS — I059 Rheumatic mitral valve disease, unspecified: Secondary | ICD-10-CM

## 2011-08-17 DIAGNOSIS — I498 Other specified cardiac arrhythmias: Secondary | ICD-10-CM | POA: Insufficient documentation

## 2011-08-17 HISTORY — PX: CARDIOVERSION: SHX1299

## 2011-08-17 LAB — CBC
HCT: 39 % (ref 36.0–46.0)
Hemoglobin: 12.8 g/dL (ref 12.0–15.0)
MCH: 30 pg (ref 26.0–34.0)
MCHC: 32.8 g/dL (ref 30.0–36.0)
MCV: 91.5 fL (ref 78.0–100.0)
RDW: 17.4 % — ABNORMAL HIGH (ref 11.5–15.5)

## 2011-08-17 SURGERY — CARDIOVERSION
Anesthesia: Monitor Anesthesia Care | Wound class: Clean

## 2011-08-17 MED ORDER — SODIUM CHLORIDE 0.9 % IJ SOLN: 3.0000 mL | INTRAMUSCULAR | Status: DC | PRN

## 2011-08-17 MED ORDER — HYDROCORTISONE 1 % EX CREA
1.0000 "application " | TOPICAL_CREAM | Freq: Three times a day (TID) | CUTANEOUS | Status: DC | PRN
  Administered 2011-08-17: 1 via TOPICAL

## 2011-08-17 MED ORDER — SODIUM CHLORIDE 0.9 % IJ SOLN: 3.0000 mL | Freq: Two times a day (BID) | INTRAMUSCULAR | Status: DC

## 2011-08-17 MED ORDER — PROPOFOL 10 MG/ML IV BOLUS
INTRAVENOUS | Status: DC | PRN
  Administered 2011-08-17: 70 mg via INTRAVENOUS

## 2011-08-17 MED ORDER — SODIUM CHLORIDE 0.9 % IV SOLN: 250.0000 mL | INTRAVENOUS | Status: DC

## 2011-08-17 MED ORDER — HYDROCORTISONE 1 % EX CREA
TOPICAL_CREAM | CUTANEOUS | Status: AC
  Filled 2011-08-17: qty 28

## 2011-08-17 NOTE — Telephone Encounter (Signed)
New problem:  Patient had cardioversion today with Dr. Myrtis Ser ,  Please advise on restarting cardiac rehab on tomorrow.

## 2011-08-17 NOTE — Transfer of Care (Signed)
Immediate Anesthesia Transfer of Care Note  Patient: Jessica [name]  Procedure(s) Performed:  CARDIOVERSION  Patient Location: PACU and Short Stay  Anesthesia Type: General  Level of Consciousness: awake and alert   Airway & Oxygen Therapy: Patient Spontanous Breathing and Patient connected to nasal cannula oxygen  Post-op Assessment: Report given to PACU RN and Post -op Vital signs reviewed and stable  Post vital signs: Reviewed and stable  Complications: No apparent anesthesia complications

## 2011-08-17 NOTE — Procedures (Signed)
Electrical Cardioversion Procedure Note [name] [id number] 28-Jun-1976  Procedure: Electrical Cardioversion Indications:  Atrial tachycardia  Procedure Details Consent: Risks of procedure as well as the alternatives and risks of each were explained to the (patient/caregiver).  Consent for procedure obtained. Time Out: Verified patient identification, verified procedure, site/side was marked, verified correct patient position, special equipment/implants available, medications/allergies/relevent history reviewed, required imaging and test results available.  Performed  Patient placed on cardiac monitor, pulse oximetry, supplemental oxygen as necessary.  Sedation given: Propofol 60mg  Pacer pads placed anterior and posterior chest.  Cardioverted 1 time(s).  Cardioverted at 120J.  Evaluation Findings: Post procedure EKG shows: NSR Complications: None Patient did tolerate procedure well.   I spoke with family also.   Jessica [name] 08/17/2011, 12:17 PM

## 2011-08-17 NOTE — Telephone Encounter (Signed)
Called and left message for patient that Dr.Ross advised that she could start back with cardiac rehab tomorrow.

## 2011-08-17 NOTE — Anesthesia Preprocedure Evaluation (Signed)
Anesthesia Evaluation  Patient identified by MRN, date of birth, ID band Patient awake    Reviewed: Allergy & Precautions, H&P , NPO status , Patient's Chart, lab work & pertinent test results  History of Anesthesia Complications (+) AWARENESS UNDER ANESTHESIA  Airway Mallampati: I TM Distance: >3 FB Neck ROM: full    Dental   Pulmonary neg pulmonary ROS,    Pulmonary exam normal       Cardiovascular Exercise Tolerance: Poor + dysrhythmias Atrial Fibrillation irregular Tachycardia    Neuro/Psych  Headaches, PSYCHIATRIC DISORDERS    GI/Hepatic negative GI ROS, (+) Hepatitis -, C  Endo/Other  Negative Endocrine ROS  Renal/GU negative Renal ROS  Genitourinary negative   Musculoskeletal   Abdominal   Peds  Hematology negative hematology ROS (+)   Anesthesia Other Findings   Reproductive/Obstetrics                           Anesthesia Physical Anesthesia Plan  ASA: III  Anesthesia Plan: General   Post-op Pain Management: MAC Combined w/ Regional for Post-op pain   Induction: Intravenous  Airway Management Planned: Mask  Additional Equipment:   Intra-op Plan:   Post-operative Plan:   Informed Consent: I have reviewed the patients History and Physical, chart, labs and discussed the procedure including the risks, benefits and alternatives for the proposed anesthesia with the patient or authorized representative who has indicated his/her understanding and acceptance.     Plan Discussed with: Anesthesiologist, CRNA and Surgeon  Anesthesia Plan Comments:         Anesthesia Quick Evaluation

## 2011-08-17 NOTE — Anesthesia Postprocedure Evaluation (Signed)
  Anesthesia Post-op Note  Patient: Jessica [name]  Procedure(s) Performed:  CARDIOVERSION  Patient Location: PACU and Short Stay  Anesthesia Type: General  Level of Consciousness: awake, alert  and oriented  Airway and Oxygen Therapy: Patient Spontanous Breathing and Patient connected to nasal cannula oxygen  Post-op Pain: none  Post-op Assessment: Post-op Vital signs reviewed and Patient's Cardiovascular Status Stable  Post-op Vital Signs: Reviewed  Complications: No apparent anesthesia complications

## 2011-08-17 NOTE — Preoperative (Signed)
Beta Blockers   Reason not to administer Beta Blockers:Took beta blocker this morning 

## 2011-08-18 ENCOUNTER — Encounter: Payer: Self-pay | Admitting: *Deleted

## 2011-08-18 ENCOUNTER — Encounter (HOSPITAL_COMMUNITY)
Admission: RE | Admit: 2011-08-18 | Discharge: 2011-08-18 | Disposition: A | Discharge status: Home / Self Care | Payer: BC Managed Care – PPO | Source: Ambulatory Visit | Attending: Internal Medicine | Admitting: Internal Medicine

## 2011-08-18 NOTE — Progress Notes (Signed)
Cardiac Rehab 0645 Pt returned to exercise s/p cardioversion on 08/17/11.  Dr. [name] sent in basket message that it is ok for pt to return to exercise.  Pt tolerated exercise with no complaints.  Will send strips to Dr. [name] as Lorain Childes.   Continue to monitor.

## 2011-08-21 ENCOUNTER — Encounter (HOSPITAL_COMMUNITY)
Admission: RE | Admit: 2011-08-21 | Discharge: 2011-08-21 | Disposition: A | Discharge status: Home / Self Care | Payer: BC Managed Care – PPO | Source: Ambulatory Visit | Attending: Internal Medicine | Admitting: Internal Medicine

## 2011-08-21 NOTE — Progress Notes (Signed)
Maryanna Shape Elwell 36 y.o. female Nutrition Note  Spoke with pt.  Nutrition Plan and Nutrition Survey reviewed with pt. Pt is following a step 2 heart healthy diet. Pt expressed understanding.  Nutrition Diagnosis   Food-and nutrition-related knowledge deficit related to lack of exposure to information as related to diagnosis of: ? CVD  Nutrition RX/ Estimated Daily Nutrition Needs for: maintenance 1800-2100 Kcal , Total Fat 60-70gm, Saturated Fat 14-16 gm, Trans Fat 2.0-2.3 gm,  Sodium less than 1500 mg Nutrition Intervention   Pt's individual nutrition plan including cholesterol goals reviewed with pt.   Benefits of adopting Therapeutic Lifestyle Changes discussed when Medficts reviewed.   Pt to attend the Portion Distortion class   Pt to attend the  ? Nutrition I class - met 08/14/11                    ? Nutrition II class   Pt given handouts for: ? Consistent vit K diet   Continue client-centered nutrition education by RD, as part of interdisciplinary care. Goal(s)   Pt to describe the benefit of including fruits, vegetables, whole grains, and low-fat dairy products in a heart healthy meal plan.   Pt able to name foods rich in vitamin K. (Pt taking Coumadin/Warfarin). Monitor and Evaluate progress toward nutrition goal with team.

## 2011-08-23 ENCOUNTER — Encounter (HOSPITAL_COMMUNITY)
Admission: RE | Admit: 2011-08-23 | Discharge: 2011-08-23 | Disposition: A | Discharge status: Home / Self Care | Payer: BC Managed Care – PPO | Source: Ambulatory Visit | Attending: Internal Medicine | Admitting: Internal Medicine

## 2011-08-24 ENCOUNTER — Ambulatory Visit (INDEPENDENT_AMBULATORY_CARE_PROVIDER_SITE_OTHER): Payer: BC Managed Care – PPO | Admitting: *Deleted

## 2011-08-24 DIAGNOSIS — I824Y9 Acute embolism and thrombosis of unspecified deep veins of unspecified proximal lower extremity: Secondary | ICD-10-CM

## 2011-08-24 DIAGNOSIS — I359 Nonrheumatic aortic valve disorder, unspecified: Secondary | ICD-10-CM

## 2011-08-24 DIAGNOSIS — D6859 Other primary thrombophilia: Secondary | ICD-10-CM

## 2011-08-24 DIAGNOSIS — Q249 Congenital malformation of heart, unspecified: Secondary | ICD-10-CM

## 2011-08-24 DIAGNOSIS — Z9889 Other specified postprocedural states: Secondary | ICD-10-CM

## 2011-08-24 DIAGNOSIS — I059 Rheumatic mitral valve disease, unspecified: Secondary | ICD-10-CM

## 2011-08-24 DIAGNOSIS — Z954 Presence of other heart-valve replacement: Secondary | ICD-10-CM

## 2011-08-24 DIAGNOSIS — I4891 Unspecified atrial fibrillation: Secondary | ICD-10-CM

## 2011-08-25 ENCOUNTER — Encounter (HOSPITAL_COMMUNITY)
Admission: RE | Admit: 2011-08-25 | Discharge: 2011-08-25 | Disposition: A | Discharge status: Home / Self Care | Payer: BC Managed Care – PPO | Source: Ambulatory Visit | Attending: Internal Medicine | Admitting: Internal Medicine

## 2011-08-25 DIAGNOSIS — Q249 Congenital malformation of heart, unspecified: Secondary | ICD-10-CM | POA: Insufficient documentation

## 2011-08-25 DIAGNOSIS — Z954 Presence of other heart-valve replacement: Secondary | ICD-10-CM | POA: Insufficient documentation

## 2011-08-25 DIAGNOSIS — E785 Hyperlipidemia, unspecified: Secondary | ICD-10-CM | POA: Insufficient documentation

## 2011-08-25 DIAGNOSIS — I059 Rheumatic mitral valve disease, unspecified: Secondary | ICD-10-CM | POA: Insufficient documentation

## 2011-08-25 DIAGNOSIS — I824Y9 Acute embolism and thrombosis of unspecified deep veins of unspecified proximal lower extremity: Secondary | ICD-10-CM | POA: Insufficient documentation

## 2011-08-25 DIAGNOSIS — I4891 Unspecified atrial fibrillation: Secondary | ICD-10-CM | POA: Insufficient documentation

## 2011-08-25 DIAGNOSIS — I499 Cardiac arrhythmia, unspecified: Secondary | ICD-10-CM | POA: Insufficient documentation

## 2011-08-25 DIAGNOSIS — F411 Generalized anxiety disorder: Secondary | ICD-10-CM | POA: Insufficient documentation

## 2011-08-25 DIAGNOSIS — D599 Acquired hemolytic anemia, unspecified: Secondary | ICD-10-CM | POA: Insufficient documentation

## 2011-08-25 DIAGNOSIS — I359 Nonrheumatic aortic valve disorder, unspecified: Secondary | ICD-10-CM | POA: Insufficient documentation

## 2011-08-25 DIAGNOSIS — Z5189 Encounter for other specified aftercare: Secondary | ICD-10-CM | POA: Insufficient documentation

## 2011-08-28 ENCOUNTER — Telehealth: Payer: Self-pay | Admitting: Internal Medicine

## 2011-08-28 ENCOUNTER — Encounter (HOSPITAL_COMMUNITY): Payer: BC Managed Care – PPO

## 2011-08-28 NOTE — Telephone Encounter (Signed)
Patient called back. Leaving for [place] in 2 weeks with her parents. Would like to go [place] (scuba and snorking[place] as deep as 25 feet in the ocean. Also would like to go on a Zip Line. Advised will ask Dr.Ross and call her back. Needs a note if approved because this will be done by an organization as a group activity.

## 2011-08-28 NOTE — Telephone Encounter (Signed)
LMOM for call back. Where is location of the trip and what will she be doing.

## 2011-08-28 NOTE — Telephone Encounter (Signed)
New problem Pt is going to take a trip and wanted a note saying it would be ok to participate in any activities.

## 2011-08-29 ENCOUNTER — Encounter: Payer: Self-pay | Admitting: Internal Medicine

## 2011-08-29 ENCOUNTER — Other Ambulatory Visit: Payer: Self-pay | Admitting: Internal Medicine

## 2011-08-30 ENCOUNTER — Encounter: Payer: BC Managed Care – PPO | Admitting: *Deleted

## 2011-08-30 ENCOUNTER — Other Ambulatory Visit: Payer: BC Managed Care – PPO | Admitting: *Deleted

## 2011-08-30 ENCOUNTER — Encounter (HOSPITAL_COMMUNITY)
Admission: RE | Admit: 2011-08-30 | Discharge: 2011-08-30 | Disposition: A | Discharge status: Home / Self Care | Payer: BC Managed Care – PPO | Source: Ambulatory Visit | Attending: Internal Medicine | Admitting: Internal Medicine

## 2011-08-30 NOTE — Progress Notes (Signed)
Reviewed home exercise with pt today.  Pt plans to exercise at health club using aerobic equipment for exercise.  Reviewed THR, pulse, RPE, sign and symptoms, and when to call 911 or MD.  Pt voiced understanding. Signed by Harriett Sine MS on 08/30/2011 at [phone]

## 2011-08-31 NOTE — Telephone Encounter (Signed)
LMOM if she wants me to mail letter or would she like to pick it up.

## 2011-08-31 NOTE — Telephone Encounter (Signed)
Fu call Patient called back said please leave letter at front desk and she will pick up tomorrow

## 2011-08-31 NOTE — Telephone Encounter (Signed)
Letter left at front desk to pick up.

## 2011-09-01 ENCOUNTER — Encounter (HOSPITAL_COMMUNITY)
Admission: RE | Admit: 2011-09-01 | Discharge: 2011-09-01 | Disposition: A | Discharge status: Home / Self Care | Payer: BC Managed Care – PPO | Source: Ambulatory Visit | Attending: Internal Medicine | Admitting: Internal Medicine

## 2011-09-04 ENCOUNTER — Encounter (HOSPITAL_COMMUNITY)
Admission: RE | Admit: 2011-09-04 | Discharge: 2011-09-04 | Disposition: A | Discharge status: Home / Self Care | Payer: BC Managed Care – PPO | Source: Ambulatory Visit | Attending: Internal Medicine | Admitting: Internal Medicine

## 2011-09-05 ENCOUNTER — Other Ambulatory Visit: Payer: Self-pay | Admitting: Internal Medicine

## 2011-09-06 ENCOUNTER — Encounter (HOSPITAL_COMMUNITY)
Admission: RE | Admit: 2011-09-06 | Discharge: 2011-09-06 | Disposition: A | Discharge status: Home / Self Care | Payer: BC Managed Care – PPO | Source: Ambulatory Visit | Attending: Internal Medicine | Admitting: Internal Medicine

## 2011-09-07 ENCOUNTER — Telehealth: Payer: Self-pay | Admitting: Internal Medicine

## 2011-09-07 DIAGNOSIS — R0602 Shortness of breath: Secondary | ICD-10-CM

## 2011-09-07 DIAGNOSIS — I059 Rheumatic mitral valve disease, unspecified: Secondary | ICD-10-CM

## 2011-09-07 DIAGNOSIS — Q249 Congenital malformation of heart, unspecified: Secondary | ICD-10-CM

## 2011-09-07 MED ORDER — METOPROLOL SUCCINATE ER 25 MG PO TB24: 25.0000 mg | ORAL_TABLET | Freq: Every day | ORAL | Status: DC

## 2011-09-07 NOTE — Telephone Encounter (Signed)
New Problem   Patient would like to know if she can be switched to extended release metoprolol tab.  Please return call to patient on cell # [phone]

## 2011-09-07 NOTE — Telephone Encounter (Signed)
Left message for pt, med changed at the pharm.

## 2011-09-08 ENCOUNTER — Encounter (HOSPITAL_COMMUNITY)
Admission: RE | Admit: 2011-09-08 | Discharge: 2011-09-08 | Disposition: A | Discharge status: Home / Self Care | Payer: BC Managed Care – PPO | Source: Ambulatory Visit | Attending: Internal Medicine | Admitting: Internal Medicine

## 2011-09-11 ENCOUNTER — Ambulatory Visit (HOSPITAL_COMMUNITY): Payer: BC Managed Care – PPO | Admitting: Anesthesiology

## 2011-09-11 ENCOUNTER — Encounter (HOSPITAL_COMMUNITY): Payer: Self-pay | Admitting: Anesthesiology

## 2011-09-11 ENCOUNTER — Other Ambulatory Visit: Payer: Self-pay

## 2011-09-11 ENCOUNTER — Ambulatory Visit (HOSPITAL_COMMUNITY)
Admission: RE | Admit: 2011-09-11 | Discharge: 2011-09-11 | Disposition: A | Discharge status: Home / Self Care | Payer: BC Managed Care – PPO | Source: Ambulatory Visit | Attending: Internal Medicine | Admitting: Internal Medicine

## 2011-09-11 ENCOUNTER — Encounter (HOSPITAL_COMMUNITY): Admission: RE | Admit: 2011-09-11 | Payer: BC Managed Care – PPO | Source: Ambulatory Visit

## 2011-09-11 ENCOUNTER — Encounter (HOSPITAL_COMMUNITY): Payer: Self-pay | Admitting: Nurse Practitioner

## 2011-09-11 ENCOUNTER — Encounter (HOSPITAL_COMMUNITY)
Admission: RE | Disposition: A | Discharge status: Home / Self Care | Payer: Self-pay | Source: Ambulatory Visit | Attending: Internal Medicine

## 2011-09-11 DIAGNOSIS — B1920 Unspecified viral hepatitis C without hepatic coma: Secondary | ICD-10-CM | POA: Insufficient documentation

## 2011-09-11 DIAGNOSIS — I4892 Unspecified atrial flutter: Secondary | ICD-10-CM | POA: Insufficient documentation

## 2011-09-11 DIAGNOSIS — Z954 Presence of other heart-valve replacement: Secondary | ICD-10-CM | POA: Insufficient documentation

## 2011-09-11 DIAGNOSIS — F329 Major depressive disorder, single episode, unspecified: Secondary | ICD-10-CM | POA: Insufficient documentation

## 2011-09-11 DIAGNOSIS — F3289 Other specified depressive episodes: Secondary | ICD-10-CM | POA: Insufficient documentation

## 2011-09-11 DIAGNOSIS — G43909 Migraine, unspecified, not intractable, without status migrainosus: Secondary | ICD-10-CM | POA: Insufficient documentation

## 2011-09-11 HISTORY — DX: Other specified postprocedural states: Z98.890

## 2011-09-11 HISTORY — PX: CARDIOVERSION: SHX1299

## 2011-09-11 HISTORY — DX: Presence of prosthetic heart valve: Z95.2

## 2011-09-11 LAB — CBC
Hemoglobin: 12.2 g/dL (ref 12.0–15.0)
MCHC: 31.8 g/dL (ref 30.0–36.0)
Platelets: 255 10*3/uL (ref 150–400)
RDW: 16.4 % — ABNORMAL HIGH (ref 11.5–15.5)

## 2011-09-11 LAB — PROTIME-INR
INR: 2.52 — ABNORMAL HIGH (ref 0.00–1.49)
Prothrombin Time: 27.6 seconds — ABNORMAL HIGH (ref 11.6–15.2)

## 2011-09-11 LAB — HCG, SERUM, QUALITATIVE: Preg, Serum: NEGATIVE

## 2011-09-11 LAB — PRO B NATRIURETIC PEPTIDE: Pro B Natriuretic peptide (BNP): 2467 pg/mL — ABNORMAL HIGH (ref 0–125)

## 2011-09-11 LAB — BASIC METABOLIC PANEL
GFR calc non Af Amer: >90 mL/min (ref >90)
Glucose, Bld: 100 mg/dL — ABNORMAL HIGH (ref 70–99)
Potassium: 4.1 mEq/L (ref 3.5–5.1)
Sodium: 139 mEq/L (ref 135–145)

## 2011-09-11 SURGERY — CARDIOVERSION
Anesthesia: Monitor Anesthesia Care | Wound class: Clean

## 2011-09-11 MED ORDER — HYDROMORPHONE HCL PF 1 MG/ML IJ SOLN: 0.2500 mg | INTRAMUSCULAR | Status: DC | PRN

## 2011-09-11 MED ORDER — PROPOFOL 10 MG/ML IV EMUL
INTRAVENOUS | Status: DC | PRN
  Administered 2011-09-11: 65 mg via INTRAVENOUS

## 2011-09-11 MED ORDER — SODIUM CHLORIDE 0.9 % IJ SOLN: 3.0000 mL | Freq: Two times a day (BID) | INTRAMUSCULAR | Status: DC

## 2011-09-11 MED ORDER — SODIUM CHLORIDE 0.9 % IV SOLN
INTRAVENOUS | Status: DC | PRN
  Administered 2011-09-11: 11:00:00 via INTRAVENOUS

## 2011-09-11 MED ORDER — SODIUM CHLORIDE 0.9 % IV SOLN: 250.0000 mL | INTRAVENOUS | Status: DC

## 2011-09-11 MED ORDER — SODIUM CHLORIDE 0.9 % IJ SOLN: 3.0000 mL | INTRAMUSCULAR | Status: DC | PRN

## 2011-09-11 MED ORDER — HYDROCORTISONE 1 % EX CREA: 1.0000 "application " | TOPICAL_CREAM | Freq: Three times a day (TID) | CUTANEOUS | Status: DC | PRN

## 2011-09-11 MED ORDER — ONDANSETRON HCL 4 MG/2ML IJ SOLN: 4.0000 mg | Freq: Once | INTRAMUSCULAR | Status: DC | PRN

## 2011-09-11 MED ORDER — LIDOCAINE HCL (CARDIAC) 20 MG/ML IV SOLN
INTRAVENOUS | Status: DC | PRN
  Administered 2011-09-11: 40 mg via INTRAVENOUS

## 2011-09-11 NOTE — Progress Notes (Signed)
Cardiac Rehab - 645 Pt arrived to rehab and reported to the rehab staff that she felt tachycardiac.  Pt placed on ekg which rapid heart rate of 132-135.  Bp 112/62.  Pt felt asymptomatic.  Pt was accompanied by her mother.  Pt called Dr. [name] and left message for her on the voicemail.  Dr. [name] returned call and plans were to be made for possible cardioversion.  At 0750, pt received call and was instructed to go to short stay for cardioversion.  Pt remained asymptomatic with no complaints.  EKG strips and rehab report faxed to short stay C.

## 2011-09-11 NOTE — Procedures (Signed)
Electrical Cardioversion Procedure Note Jessica [name] [id number] July 18, 1976  Procedure: Electrical Cardioversion Indications:  Atrial Flutter.  Patient has been therapeutic on coumadin long-term, INR > 2 today.   Procedure Details Consent: Risks of procedure as well as the alternatives and risks of each were explained to the (patient/caregiver).  Consent for procedure obtained. Time Out: Verified patient identification, verified procedure, site/side was marked, verified correct patient position, special equipment/implants available, medications/allergies/relevent history reviewed, required imaging and test results available.  Performed  Patient placed on cardiac monitor, pulse oximetry, supplemental oxygen as necessary.  Sedation given: Propofol Pacer pads placed anterior and posterior chest.  Cardioverted 1 time(s).  Cardioverted at 150J.  Evaluation Findings: Post procedure EKG shows: NSR Complications: None Patient did tolerate procedure well.   Marca Ancona 09/11/2011, 11:15 AM

## 2011-09-11 NOTE — Transfer of Care (Signed)
Immediate Anesthesia Transfer of Care Note  Patient: Jessica [name]  Procedure(s) Performed: Procedure(s) (LRB): CARDIOVERSION (N/A)  Patient Location: PACU and Short Stay  Anesthesia Type: MAC  Level of Consciousness: awake, alert , oriented and patient cooperative  Airway & Oxygen Therapy: Patient Spontanous Breathing and Patient connected to nasal cannula oxygen  Post-op Assessment: Report given to PACU RN, Post -op Vital signs reviewed and stable and Patient moving all extremities X 4  Post vital signs: Reviewed and stable  Complications: No apparent anesthesia complications

## 2011-09-11 NOTE — H&P (Signed)
Patient ID: Jessica [name] MRN: [id number], DOB/AGE: March 07, 1976   Admit date: 09/11/2011   Primary Physician: Carrie Mew, MD, MD Primary Cardiologist: Lovina Reach  Pt. Profile:  36 y/o female with h/o congenital heart dzs s/p multiple surgeries, who presented to Cardiac rehab this am in a.flutter.  Problem List  Past Medical History  Diagnosis Date  . 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 Migraine   . Depression   . Hepatitis C   . Atrioventricular canal (AVC), complete     Has had 7 sternotomies for repair of this  . Acute on chronic heart failure     periprosthetic mitral valve leak  . H/O mitral valve replacement     a. x 3 -  most recent 06/2011 - [employer] MVR  . History of aortic valve replacement     a. 25mm SJM 1995  . H/O maze procedure     06/2011  . 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 Aorto ventricular canal defect repair     age 2 mths  . Mitral valve replacement 06/2011    Third mitral valve replacement  . Aortic valve replacement   . Sternotomy     She has had 7 sternotomy  . Cardioversion 08/17/2011    Procedure: CARDIOVERSION;  Surgeon: Luis Abed, MD;  Location: [facility] OR;  Service: Cardiovascular;  Laterality: N/A;     Allergies  Allergies  Allergen Reactions  . Povidone-Iodine     HPI  36 y/o female with the above complex problem list.  She was in her usoh until this am @ 3a when she awoke to use the bathroom and noted that her heart rate was up.  She didn't really not palpitations but could tell her rate was up as she could hear her mech valves clicking more rapidly.  She went back to bed and when she awoke later in the am, she cont to note that she was tachycardic.  She did not have any c/p, sob, presycope, or syncope.  She went to cardiac rehab as scheduled and there, an ecg showed atypical a.flutter @ 137 bpm.  She discussed this with Dr. [name] and  arrangements were made for DCCV today.  Pt is currently in short stay and asymptomatic.  INR is therapeutic (checked this am).  Home Medications  Medications Prior to Admission  Medication Dose Route Frequency Provider Last Rate Last Dose  . 0.9 %  sodium chloride infusion  250 mL Intravenous Continuous Pricilla Riffle, MD      . hydrocortisone cream 1 % 1 application  1 application Topical TID PRN Pricilla Riffle, MD      . sodium chloride 0.9 % injection 3 mL  3 mL Intravenous Q12H Pricilla Riffle, MD      . sodium chloride 0.9 % injection 3 mL  3 mL Intravenous PRN Pricilla Riffle, MD       Medications Prior to Admission  Medication Sig Dispense Refill  . aspirin 81 MG tablet Take 81 mg by mouth daily.        . CYMBALTA 30 MG capsule TAKE 1 CAPSULE BY MOUTH EVERY DAY  30 capsule  0  . furosemide (LASIX) 40 MG tablet Take 80 mg by mouth daily.       . hydrochlorothiazide 50 MG tablet TAKE 1 TABLET BY MOUTH DAILY  30 tablet  0  . metoprolol succinate (TOPROL XL) 25 MG 24 hr tablet Take 1 tablet (25 mg total) by mouth daily.  30 tablet  11  . norethindrone-ethinyl estradiol (ORTHO-NOVUM 1-35 TAB,NORTREL 1-35 TAB) 1-35 MG-MCG per tablet Take 1 tablet by mouth daily.        . potassium chloride (K-DUR) 10 MEQ tablet Take 2 tablets (20 mEq total) by mouth 2 (two) times daily.  120 tablet  11  . spironolactone (ALDACTONE) 50 MG tablet TAKE 1 TABLET BY MOUTH DAILY  30 tablet  0  . warfarin (COUMADIN) 4 MG tablet TAKE 1 TABLET BY MOUTH DAILY  30 tablet  0  . oxyCODONE-acetaminophen (PERCOCET) 5-325 MG per tablet Take 5-325 mg by mouth as needed.      . zaleplon (SONATA) 10 MG capsule Take 10 mg by mouth as needed.         Family History  Problem Relation Age of Onset  . Colon cancer    . Colon polyps    . Hyperlipidemia Mother   . Depression Mother   . Hyperlipidemia Father   . Hypertension Father     History   Social History  . Marital Status: Single    Spouse Name: N/A    Number of Children:  N/A  . Years of Education: N/A   Occupational History  . legal specialist    Social History Main Topics  . Smoking status: Never Smoker   . Smokeless tobacco: Never Used  . Alcohol Use: Yes     occasionally  . Drug Use: No  . Sexually Active: Yes   Other Topics Concern  . Not on file   Social History Narrative  . No narrative on file     Review of Systems General:  No chills, fever, night sweats or weight changes.  Cardiovascular:  No chest pain, dyspnea on exertion, edema, orthopnea, palpitations, paroxysmal nocturnal dyspnea.  Doesn't really feel tachypalps but can hear mech valves clicking rapidly. Dermatological: No rash, lesions/masses Respiratory: No cough, dyspnea Urologic: No hematuria, dysuria Abdominal:   No nausea, vomiting, diarrhea, bright red blood per rectum, melena, or hematemesis Neurologic:  No visual changes, wkns, changes in mental status. All other systems reviewed and are otherwise negative except as noted above.  Physical Exam  Blood pressure 112/81, pulse 136, temperature 96.9 F (36.1 C), temperature source Oral, resp. rate 20, height 5\' 4"  (1.626 m), weight 128 lb (58.06 kg), SpO2 99.00%.  General: Pleasant, NAD Psych: Normal affect. Neuro: Alert and oriented X 3. Moves all extremities spontaneously. HEENT: Normal  Neck: Supple without bruits or JVD. Lungs:  Resp regular and unlabored, CTA. Heart: RRR - tachy. Crisp s1/s2 clicks.  no s3, s4, or murmurs. Abdomen: Soft, non-tender, non-distended, BS + x 4.  Extremities: No clubbing, cyanosis or edema. DP/PT/Radials 2+ and equal bilaterally.  Labs   Lab Results  Component Value Date   WBC 7.0 09/11/2011   HGB 12.2 09/11/2011   HCT 38.4 09/11/2011   MCV 96.0 09/11/2011   PLT 255 09/11/2011    Lab 09/11/11 0908  NA 139  K 4.1  CL 98  CO2 30  BUN 21  CREATININE 0.81  CALCIUM 9.7  PROT --  BILITOT --  ALKPHOS --  ALT --  AST --  GLUCOSE 100*    Lab Results  Component Value Date    INR 2.52* 09/11/2011   INR 3.1 08/24/2011   INR 3.0* 08/16/2011    Radiology/Studies  No  results found.  ECG  A. Flutter, 137  ASSESSMENT AND PLAN  1.  Atypical Atrial Flutter:  Onset @ 3am.  Asymptomatic but can hear elevated rate.  Arranged for DCCV this am.  Labs ok and INR therapeutic.  Proceed with DCCV and d/c from short stay after.  Cont home meds.  2.  Congenital Heart Dzs s/p MVR/AVR:  INR Rx.   Signed, Nicolasa Ducking, NP 09/11/2011, 10:44 AM  Patient seen with NP Brion Aliment, agree with note.  DCCV today.   Marca Ancona 09/11/2011 11:20 AM

## 2011-09-11 NOTE — Progress Notes (Signed)
Discharged home; monitor remains NSR rate 82-98; no c/o

## 2011-09-11 NOTE — Anesthesia Postprocedure Evaluation (Signed)
  Anesthesia Post-op Note  Patient: Jessica [name]  Procedure(s) Performed: Procedure(s) (LRB): CARDIOVERSION (N/A)  Patient Location: PACU and Short Stay  Anesthesia Type: MAC  Level of Consciousness: awake, alert , oriented and patient cooperative  Airway and Oxygen Therapy: Patient Spontanous Breathing and Patient connected to nasal cannula oxygen  Post-op Pain: none  Post-op Assessment: Post-op Vital signs reviewed  Post-op Vital Signs: Reviewed and stable  Complications: No apparent anesthesia complications

## 2011-09-11 NOTE — Anesthesia Preprocedure Evaluation (Signed)
Anesthesia Evaluation  Patient identified by MRN, date of birth, ID band Patient awake    Reviewed: Allergy & Precautions, H&P , NPO status , Patient's Chart, lab work & pertinent test results  Airway Mallampati: I TM Distance: >3 FB Neck ROM: full    Dental  (+) Teeth Intact   Pulmonary    Pulmonary exam normal       Cardiovascular + dysrhythmias Atrial Fibrillation + Valvular Problems/Murmurs irregular Tachycardia    Neuro/Psych  Headaches,    GI/Hepatic (+) Hepatitis -  Endo/Other    Renal/GU      Musculoskeletal   Abdominal   Peds  Hematology   Anesthesia Other Findings   Reproductive/Obstetrics                           Anesthesia Physical Anesthesia Plan  ASA: III  Anesthesia Plan: General   Post-op Pain Management:    Induction: Intravenous  Airway Management Planned: Mask  Additional Equipment:   Intra-op Plan:   Post-operative Plan:   Informed Consent: I have reviewed the patients History and Physical, chart, labs and discussed the procedure including the risks, benefits and alternatives for the proposed anesthesia with the patient or authorized representative who has indicated his/her understanding and acceptance.     Plan Discussed with: CRNA, Anesthesiologist and Surgeon  Anesthesia Plan Comments:         Anesthesia Quick Evaluation

## 2011-09-13 ENCOUNTER — Encounter (HOSPITAL_COMMUNITY): Payer: BC Managed Care – PPO

## 2011-09-15 ENCOUNTER — Encounter (HOSPITAL_COMMUNITY): Payer: BC Managed Care – PPO

## 2011-09-18 ENCOUNTER — Encounter (HOSPITAL_COMMUNITY): Payer: BC Managed Care – PPO

## 2011-09-20 ENCOUNTER — Encounter (HOSPITAL_COMMUNITY): Payer: BC Managed Care – PPO

## 2011-09-20 ENCOUNTER — Other Ambulatory Visit: Payer: Self-pay | Admitting: *Deleted

## 2011-09-20 DIAGNOSIS — I4891 Unspecified atrial fibrillation: Secondary | ICD-10-CM

## 2011-09-20 DIAGNOSIS — R0602 Shortness of breath: Secondary | ICD-10-CM

## 2011-09-21 ENCOUNTER — Telehealth: Payer: Self-pay | Admitting: Internal Medicine

## 2011-09-21 ENCOUNTER — Other Ambulatory Visit: Payer: BC Managed Care – PPO

## 2011-09-21 ENCOUNTER — Encounter (HOSPITAL_COMMUNITY): Payer: Self-pay | Admitting: Internal Medicine

## 2011-09-21 ENCOUNTER — Ambulatory Visit: Payer: BC Managed Care – PPO | Admitting: Internal Medicine

## 2011-09-21 NOTE — Telephone Encounter (Signed)
Talked with pt. Pt not sure she is going to be in [place] in time to come to our office for lab. I have faxed an order to [facility]  [address] for BMET/Hepatic panel/CBC-diff/BNP/INR/PT. Pt is aware she can go there until 6PM to have her lab done. I talked with Morrie Sheldon at [facility] [phone] and fax [phone].

## 2011-09-21 NOTE — Telephone Encounter (Signed)
Pt calling re blood work for today, pt coming in from [place] and flight was delayed, said it had to be done today, is there anywhere she can have it done after 5p?

## 2011-09-21 NOTE — Telephone Encounter (Signed)
LMTCB

## 2011-09-22 ENCOUNTER — Encounter: Payer: Self-pay | Admitting: *Deleted

## 2011-09-22 ENCOUNTER — Other Ambulatory Visit (INDEPENDENT_AMBULATORY_CARE_PROVIDER_SITE_OTHER): Payer: BC Managed Care – PPO

## 2011-09-22 ENCOUNTER — Ambulatory Visit (INDEPENDENT_AMBULATORY_CARE_PROVIDER_SITE_OTHER): Payer: BC Managed Care – PPO | Admitting: Internal Medicine

## 2011-09-22 ENCOUNTER — Encounter (HOSPITAL_COMMUNITY): Payer: BC Managed Care – PPO

## 2011-09-22 DIAGNOSIS — I4891 Unspecified atrial fibrillation: Secondary | ICD-10-CM

## 2011-09-22 DIAGNOSIS — Z8679 Personal history of other diseases of the circulatory system: Secondary | ICD-10-CM

## 2011-09-22 DIAGNOSIS — I499 Cardiac arrhythmia, unspecified: Secondary | ICD-10-CM

## 2011-09-22 DIAGNOSIS — I359 Nonrheumatic aortic valve disorder, unspecified: Secondary | ICD-10-CM

## 2011-09-22 DIAGNOSIS — Q249 Congenital malformation of heart, unspecified: Secondary | ICD-10-CM

## 2011-09-22 DIAGNOSIS — R0602 Shortness of breath: Secondary | ICD-10-CM

## 2011-09-22 LAB — CBC WITH DIFFERENTIAL/PLATELET
Basophils Absolute: 0 10*3/uL (ref 0.0–0.1)
Basophils Relative: 0.4 % (ref 0.0–3.0)
Eosinophils Absolute: 0.1 10*3/uL (ref 0.0–0.7)
HCT: 37.8 % (ref 36.0–46.0)
Hemoglobin: 12.6 g/dL (ref 12.0–15.0)
Lymphs Abs: 1 10*3/uL (ref 0.7–4.0)
MCHC: 33.2 g/dL (ref 30.0–36.0)
MCV: 94.9 fl (ref 78.0–100.0)
Monocytes Absolute: 0.4 10*3/uL (ref 0.1–1.0)
Neutro Abs: 4 10*3/uL (ref 1.4–7.7)
RBC: 3.99 Mil/uL (ref 3.87–5.11)
RDW: 16.6 % — ABNORMAL HIGH (ref 11.5–14.6)

## 2011-09-22 LAB — BASIC METABOLIC PANEL
BUN: 34 mg/dL — ABNORMAL HIGH (ref 6–23)
Chloride: 94 mEq/L — ABNORMAL LOW (ref 96–112)
GFR: 60.86 mL/min (ref >60.00)
Potassium: 3.4 mEq/L — ABNORMAL LOW (ref 3.5–5.1)

## 2011-09-22 LAB — HEPATIC FUNCTION PANEL
ALT: 33 U/L (ref 0–35)
Albumin: 4.1 g/dL (ref 3.5–5.2)
Bilirubin, Direct: 0.3 mg/dL (ref 0.0–0.3)
Total Protein: 7.4 g/dL (ref 6.0–8.3)

## 2011-09-22 LAB — PROTIME-INR: Prothrombin Time: 54 s (ref 10.2–12.4)

## 2011-09-25 ENCOUNTER — Encounter (HOSPITAL_COMMUNITY): Admission: RE | Admit: 2011-09-25 | Payer: BC Managed Care – PPO | Source: Ambulatory Visit

## 2011-09-26 ENCOUNTER — Telehealth: Payer: Self-pay | Admitting: Internal Medicine

## 2011-09-26 NOTE — Telephone Encounter (Signed)
Pt has ablation 10-05-11, wants to talk to nurse, pls call

## 2011-09-26 NOTE — Telephone Encounter (Signed)
Left message for pt to call back  °

## 2011-09-27 ENCOUNTER — Encounter (HOSPITAL_COMMUNITY): Payer: BC Managed Care – PPO

## 2011-09-27 NOTE — Telephone Encounter (Signed)
Never heard back from pt.

## 2011-09-28 ENCOUNTER — Ambulatory Visit: Payer: BC Managed Care – PPO

## 2011-09-28 ENCOUNTER — Other Ambulatory Visit: Payer: BC Managed Care – PPO

## 2011-09-28 DIAGNOSIS — Q249 Congenital malformation of heart, unspecified: Secondary | ICD-10-CM

## 2011-09-28 DIAGNOSIS — I059 Rheumatic mitral valve disease, unspecified: Secondary | ICD-10-CM

## 2011-09-28 DIAGNOSIS — Z01810 Encounter for preprocedural cardiovascular examination: Secondary | ICD-10-CM

## 2011-09-28 DIAGNOSIS — I4891 Unspecified atrial fibrillation: Secondary | ICD-10-CM

## 2011-09-28 DIAGNOSIS — I359 Nonrheumatic aortic valve disorder, unspecified: Secondary | ICD-10-CM

## 2011-09-28 DIAGNOSIS — D6859 Other primary thrombophilia: Secondary | ICD-10-CM

## 2011-09-28 DIAGNOSIS — I824Y9 Acute embolism and thrombosis of unspecified deep veins of unspecified proximal lower extremity: Secondary | ICD-10-CM

## 2011-09-28 DIAGNOSIS — Z5181 Encounter for therapeutic drug level monitoring: Secondary | ICD-10-CM

## 2011-09-28 DIAGNOSIS — Z7901 Long term (current) use of anticoagulants: Secondary | ICD-10-CM

## 2011-09-28 DIAGNOSIS — Z954 Presence of other heart-valve replacement: Secondary | ICD-10-CM

## 2011-09-28 DIAGNOSIS — Z9889 Other specified postprocedural states: Secondary | ICD-10-CM

## 2011-09-28 LAB — CBC WITH DIFFERENTIAL/PLATELET
Basophils Relative: 0.4 % (ref 0.0–3.0)
HCT: 37 % (ref 36.0–46.0)
Hemoglobin: 12.2 g/dL (ref 12.0–15.0)
Lymphocytes Relative: 22.5 % (ref 12.0–46.0)
Lymphs Abs: 1 10*3/uL (ref 0.7–4.0)
MCHC: 33 g/dL (ref 30.0–36.0)
Monocytes Relative: 7.9 % (ref 3.0–12.0)
Neutro Abs: 3 10*3/uL (ref 1.4–7.7)
RBC: 3.91 Mil/uL (ref 3.87–5.11)

## 2011-09-28 LAB — BASIC METABOLIC PANEL
CO2: 32 mEq/L (ref 19–32)
Calcium: 10 mg/dL (ref 8.4–10.5)
GFR: 78.94 mL/min (ref >60.00)
Potassium: 3.4 mEq/L — ABNORMAL LOW (ref 3.5–5.1)
Sodium: 136 mEq/L (ref 135–145)

## 2011-09-28 NOTE — Assessment & Plan Note (Addendum)
Patient s/p recent surgery in December.  Except for tachycardia, patient looks good.  Tolerating increased HR rel well. Will contact [facility].

## 2011-09-28 NOTE — Assessment & Plan Note (Signed)
Patient now has failed two cardioversion, last one a few days before current spell started. She is rel comfortable.   I would keep on same regimen.  Could increase b blocker by 1/2.  Cut back if dizzy. Will contact [facility] re ablation.

## 2011-09-28 NOTE — Progress Notes (Signed)
HPI She has a history of AV canal defect, s/p repair early in life. Review of her records shows she underwent MV replacement initially with heterograft and then 23 mm St Jude mechanical valve.  In May 1991 she underwent modified Konno operation with a 25 mm St Jude valve in the aortic position. In 1995 she had a 23 mm Carbomedics valve placed in the mitral position for a partially obstructed valve. 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 All surgeries were done at [facility].  She was discharged from [facility] on 12/16. She underwent MV replacement (3rd) with St Jude prosthesis. She also had TV reconstruction, MAZE procedure, isthmus ablation and reconstruction of her tricuspid valve.. Also had repair of ascending aortic tear. (this was patient's 7th stenotomy). I saw her in clinic back in December. Since in clinic last she has been cardioverted twice for atrial flutter.  Last episode was a couple wks ago. The patient just got back from [place].  She call from her trip to say that her HR was increased.  130s.  She denied dizziness.  Breathing is OK at rest.  Does give out easier.  No CP. She cut back on physical activity and had a fun time Since being home she denies CP. Allergies  Allergen Reactions  . Povidone-Iodine     Current Outpatient Prescriptions  Medication Sig Dispense Refill  . aspirin 81 MG tablet Take 81 mg by mouth daily.        . CYMBALTA 30 MG capsule TAKE 1 CAPSULE BY MOUTH EVERY DAY  30 capsule  0  . furosemide (LASIX) 40 MG tablet Take 40 mg by mouth daily.       . hydrochlorothiazide 50 MG tablet TAKE 1 TABLET BY MOUTH DAILY  30 tablet  0  . metoprolol succinate (TOPROL XL) 25 MG 24 hr tablet Take 1 tablet (25 mg total) by mouth daily.  30 tablet  11  . metoprolol tartrate (LOPRESSOR) 25 MG tablet Take 25 mg by mouth 3 (three) times daily.      . norethindrone-ethinyl estradiol (ORTHO-NOVUM 1-35 TAB,NORTREL 1-35 TAB) 1-35 MG-MCG per tablet Take 1 tablet by mouth daily.         . potassium chloride (K-DUR) 10 MEQ tablet Take two tablets by mouth once daily.      Marland Kitchen spironolactone (ALDACTONE) 50 MG tablet TAKE 1 TABLET BY MOUTH DAILY  30 tablet  0  . warfarin (COUMADIN) 4 MG tablet TAKE 1 TABLET BY MOUTH DAILY  30 tablet  0  . zaleplon (SONATA) 10 MG capsule Take 10 mg by mouth as needed.       Marland Kitchen oxyCODONE-acetaminophen (PERCOCET) 5-325 MG per tablet Take 5-325 mg by mouth as needed.        Past Medical History  Diagnosis Date  . 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 Migraine   . Depression   . Hepatitis C   . Atrioventricular canal (AVC), complete     Has had 7 sternotomies for repair of this  . Acute on chronic heart failure     periprosthetic mitral valve leak  . H/O mitral valve replacement     a. x 3 -  most recent 06/2011 - St. Jude MVR  . History of aortic valve replacement     a. 25mm SJM 1995  . H/O maze procedure     06/2011  . History of tricuspid valve repair  06/2011    Past Surgical History  Procedure Date  . Aorto ventricular canal defect repair     age 27 mths  . Mitral valve replacement 06/2011    Third mitral valve replacement  . Aortic valve replacement   . Sternotomy     She has had 7 sternotomy  . Cardioversion 08/17/2011    Procedure: CARDIOVERSION;  Surgeon: Luis Abed, MD;  Location: [facility] OR;  Service: Cardiovascular;  Laterality: N/A;  . Cardioversion 09/11/2011    Procedure: CARDIOVERSION;  Surgeon: Pricilla Riffle, MD;  Location: [facility] OR;  Service: Cardiovascular;  Laterality: N/A;  Carioversion    Family History  Problem Relation Age of Onset  . Colon cancer    . Colon polyps    . Hyperlipidemia Mother   . Depression Mother   . Hyperlipidemia Father   . Hypertension Father     History   Social History  . Marital Status: Single    Spouse Name: N/A    Number of Children: N/A  . Years of Education: N/A   Occupational History  . legal specialist    Social History Main  Topics  . Smoking status: Never Smoker   . Smokeless tobacco: Never Used  . Alcohol Use: Yes     occasionally  . Drug Use: No  . Sexually Active: Yes   Other Topics Concern  . Not on file   Social History Narrative  . No narrative on file    Review of Systems:  All systems reviewed.  They are negative to the above problem except as previously stated.  Vital Signs: BP 110/52  Pulse 133  Ht 5\' 4"  (1.626 m)  Wt 126 lb (57.153 kg)  BMI 21.63 kg/m2  Physical Exam Patient is in NAD HEENT:  Normocephalic, atraumatic. EOMI, PERRLA.  Neck: JVP is normal. No thyromegaly. No bruits.  Lungs: clear to auscultation. No rales no wheezes.  Heart: Regular rate and rhythm. Normal S1, S2. No S3.  Crisp valve sounds. PMI not displaced.  Abdomen:  Supple, nontender. Normal bowel sounds. No masses. No hepatomegaly.  Extremities:   Good distal pulses throughout. No lower extremity edema.  Musculoskeletal :moving all extremities.  Neuro:   alert and oriented x3.  CN II-XII grossly intact.  EKG:  Probable atrial flutter  133 bpm.  RBBB.  LAFB.   Assessment and Plan:

## 2011-09-28 NOTE — Patient Instructions (Signed)
  Latest dosing instructions   Total Sun Mon Tue Wed Thu Fri Sat   28 4 mg 4 mg 4 mg 4 mg 4 mg 4 mg 4 mg    (4 mg1) (4 mg1) (4 mg1) (4 mg1) (4 mg1) (4 mg1) (4 mg1)        

## 2011-09-29 ENCOUNTER — Other Ambulatory Visit: Payer: Self-pay

## 2011-09-29 ENCOUNTER — Encounter (HOSPITAL_COMMUNITY): Payer: BC Managed Care – PPO

## 2011-09-29 ENCOUNTER — Ambulatory Visit (HOSPITAL_COMMUNITY): Payer: BC Managed Care – PPO | Admitting: Certified Registered"

## 2011-09-29 ENCOUNTER — Ambulatory Visit: Payer: BC Managed Care – PPO | Admitting: Internal Medicine

## 2011-09-29 ENCOUNTER — Telehealth: Payer: Self-pay | Admitting: Internal Medicine

## 2011-09-29 ENCOUNTER — Encounter (HOSPITAL_COMMUNITY)
Admission: RE | Disposition: A | Discharge status: Home / Self Care | Payer: Self-pay | Source: Ambulatory Visit | Attending: Cardiovascular Disease

## 2011-09-29 ENCOUNTER — Encounter (HOSPITAL_COMMUNITY): Payer: Self-pay | Admitting: Certified Registered"

## 2011-09-29 ENCOUNTER — Ambulatory Visit (HOSPITAL_COMMUNITY)
Admission: RE | Admit: 2011-09-29 | Discharge: 2011-09-29 | Disposition: A | Discharge status: Home / Self Care | Payer: BC Managed Care – PPO | Source: Ambulatory Visit | Attending: Cardiovascular Disease | Admitting: Cardiovascular Disease

## 2011-09-29 DIAGNOSIS — Z954 Presence of other heart-valve replacement: Secondary | ICD-10-CM | POA: Insufficient documentation

## 2011-09-29 DIAGNOSIS — I4891 Unspecified atrial fibrillation: Secondary | ICD-10-CM | POA: Insufficient documentation

## 2011-09-29 DIAGNOSIS — Z7982 Long term (current) use of aspirin: Secondary | ICD-10-CM | POA: Insufficient documentation

## 2011-09-29 DIAGNOSIS — F3289 Other specified depressive episodes: Secondary | ICD-10-CM | POA: Insufficient documentation

## 2011-09-29 DIAGNOSIS — G43909 Migraine, unspecified, not intractable, without status migrainosus: Secondary | ICD-10-CM | POA: Insufficient documentation

## 2011-09-29 DIAGNOSIS — B1920 Unspecified viral hepatitis C without hepatic coma: Secondary | ICD-10-CM | POA: Insufficient documentation

## 2011-09-29 DIAGNOSIS — F329 Major depressive disorder, single episode, unspecified: Secondary | ICD-10-CM | POA: Insufficient documentation

## 2011-09-29 DIAGNOSIS — Z79899 Other long term (current) drug therapy: Secondary | ICD-10-CM | POA: Insufficient documentation

## 2011-09-29 HISTORY — PX: CARDIOVERSION: SHX1299

## 2011-09-29 SURGERY — CARDIOVERSION
Anesthesia: Monitor Anesthesia Care | Wound class: Clean

## 2011-09-29 MED ORDER — SODIUM CHLORIDE 0.9 % IJ SOLN: 3.0000 mL | INTRAMUSCULAR | Status: DC | PRN

## 2011-09-29 MED ORDER — HYDROCORTISONE 1 % EX CREA: 1.0000 "application " | TOPICAL_CREAM | Freq: Three times a day (TID) | CUTANEOUS | Status: DC | PRN

## 2011-09-29 MED ORDER — LIDOCAINE HCL (CARDIAC) 20 MG/ML IV SOLN
INTRAVENOUS | Status: DC | PRN
  Administered 2011-09-29: 20 mg via INTRAVENOUS

## 2011-09-29 MED ORDER — SODIUM CHLORIDE 0.9 % IV SOLN
250.0000 mL | INTRAVENOUS | Status: DC
  Administered 2011-09-29: 1000 mL via INTRAVENOUS

## 2011-09-29 MED ORDER — PROPOFOL 10 MG/ML IV BOLUS
INTRAVENOUS | Status: DC | PRN
  Administered 2011-09-29: 45 ug via INTRAVENOUS

## 2011-09-29 MED ORDER — SODIUM CHLORIDE 0.9 % IJ SOLN: 3.0000 mL | Freq: Two times a day (BID) | INTRAMUSCULAR | Status: DC

## 2011-09-29 NOTE — Progress Notes (Signed)
D/C instructions given, pt verbalized understanding 

## 2011-09-29 NOTE — Interval H&P Note (Signed)
History and Physical Interval Note:  09/29/2011 10:04 AM  Jessica [name]  has presented today for surgery, with the diagnosis of a-fib  The various methods of treatment have been discussed with the patient and family. After consideration of risks, benefits and other options for treatment, the patient has consented to  Procedure(s) (LRB): CARDIOVERSION (N/A) as a surgical intervention .  The patients' history has been reviewed, patient examined, no change in status, stable for surgery.  I have reviewed the patients' chart and labs.  Questions were answered to the patient's satisfaction.     Elyn Aquas.

## 2011-09-29 NOTE — Transfer of Care (Signed)
Immediate Anesthesia Transfer of Care Note  Patient: Jessica [name]  Procedure(s) Performed: Procedure(s) (LRB): CARDIOVERSION (N/A)  Patient Location: PACU and Short Stay  Anesthesia Type: General  Level of Consciousness: awake, alert  and oriented  Airway & Oxygen Therapy: Patient Spontanous Breathing and Patient connected to nasal cannula oxygen  Post-op Assessment: Post -op Vital signs reviewed and stable  Post vital signs: Reviewed and stable  Complications: No apparent anesthesia complications

## 2011-09-29 NOTE — Anesthesia Postprocedure Evaluation (Signed)
  Anesthesia Post-op Note  Patient: Jessica [name]  Procedure(s) Performed: Procedure(s) (LRB): CARDIOVERSION (N/A)  Patient Location: PACU and Short Stay  Anesthesia Type: General  Level of Consciousness: awake  Airway and Oxygen Therapy: Patient Spontanous Breathing  Post-op Pain: none  Post-op Assessment: Post-op Vital signs reviewed  Post-op Vital Signs: Reviewed  Complications: No apparent anesthesia complications

## 2011-09-29 NOTE — Telephone Encounter (Signed)
How much metoprolol should she stay on?

## 2011-09-29 NOTE — Discharge Instructions (Signed)
Electrical Cardioversion Cardioversion is the delivery of a jolt of electricity to change the rhythm of the heart. Sticky patches or metal paddles are placed on the chest to deliver the electricity from a special device. This is done to restore a normal rhythm. A rhythm that is too fast or not regular keeps the heart from pumping well. Compared to medicines used to change an abnormal rhythm, cardioversion is faster and works better. It is also unpleasant and may dislodge blood clots from the heart. WHEN WOULD THIS BE DONE?  In an emergency:   There is low or no blood pressure as a result of the heart rhythm.   Normal rhythm must be restored as fast as possible to protect the brain and heart from further damage.   It may save a life.   For less serious heart rhythms, such as atrial fibrillation or flutter, in which:   The heart is beating too fast or is not regular.   The heart is still able to pump enough blood, but not as well as it should.   Medicine to change the rhythm has not worked.   It is safe to wait in order to allow time for preparation.  LET YOUR CAREGIVER KNOW ABOUT:   Every medicine you are taking. It is very important to do this! Know when to take or stop taking any of them.   Any time in the past that you have felt your heart was not beating normally.  RISKS AND COMPLICATIONS   Clots may form in the chambers of the heart if it is beating too fast. These clots may be dislodged during the procedure and travel to other parts of the body.   There is risk of a stroke during and after the procedure if a clot moves. Blood thinners lower this risk.   You may have a special test of your heart (TEE) to make sure there are no clots in your heart.  BEFORE THE PROCEDURE   You may have some tests to see how well your heart is working.   You may start taking blood thinners so your blood does not clot as easily.   Other drugs may be given to help your heart work better.   PROCEDURE (SCHEDULED)  The procedure is typically done in a hospital by a heart doctor (cardiologist).   You will be told when and where to go.   You may be given some medicine through an intravenous (IV) access to reduce discomfort and make you sleepy before the procedure.   Your whole body may move when the shock is delivered. Your chest may feel sore.   You may be able to go home after a few hours. Your heart rhythm will be watched to make sure it does not change.  HOME CARE INSTRUCTIONS   Only take medicine as directed by your caregiver. Be sure you understand how and when to take your medicine.   Learn how to feel your pulse and check it often.   Limit your activity for 48 hours.   Avoid caffeine and other stimulants as directed.  SEEK MEDICAL CARE IF:   You feel like your heart is beating too fast or your pulse is not regular.   You have any questions about your medicines.   You have bleeding that will not stop.  SEEK IMMEDIATE MEDICAL CARE IF:   You are dizzy or feel faint.   It is hard to breathe or you feel short of breath.     There is a change in discomfort in your chest.   Your speech is slurred or you have trouble moving your arm or leg on one side.   You get a muscle cramp.   Your fingers or toes turn cold or blue.  MAKE SURE YOU:   Understand these instructions.   Will watch your condition.   Will get help right away if you are not doing well or get worse.  Document Released: 06/30/2002 Document Revised: 06/29/2011 Document Reviewed: 10/30/2007 Exit[employer] Patient Information 2012 [employer]. 

## 2011-09-29 NOTE — Op Note (Signed)
    Cardioversion Note  Jessica [name] [id number] [birth year]  Procedure: DC Cardioversion Indications: Atrial Fibrillation  Procedure Details Consent: Obtained Time Out: Verified patient identification, verified procedure, site/side was marked, verified correct patient position, special equipment/implants available, Radiology Safety Procedures followed,  medications/allergies/relevent history reviewed, required imaging and test results available.  Performed  The patient has been on adequate anticoagulation.  She received IV Lidocaine prior to propafol.  The patient received IV  Propafol 45 mg  for sedation.  Synchronous cardioversion was performed at 120 joules.  The cardioversion was successful.  Complications: No apparent complications Patient did tolerate procedure well.   Jessica [name], Jessica [name]., MD, [facility] 09/29/2011, 10:17 AM

## 2011-09-29 NOTE — Anesthesia Preprocedure Evaluation (Addendum)
Anesthesia Evaluation  Patient identified by MRN, date of birth, ID band Patient awake    Reviewed: NPO status , Patient's Chart, lab work & pertinent test results  Airway Mallampati: II      Dental  (+) Teeth Intact   Pulmonary          Cardiovascular + dysrhythmias Atrial Fibrillation Rhythm:irregular Rate:Normal  Hx Congential heart defect; repair s/p 34 years Hx AVR, MVR   Neuro/Psych    GI/Hepatic   Endo/Other    Renal/GU      Musculoskeletal   Abdominal   Peds  Hematology   Anesthesia Other Findings   Reproductive/Obstetrics                         Anesthesia Physical Anesthesia Plan  ASA: II  Anesthesia Plan: MAC   Post-op Pain Management:    Induction:   Airway Management Planned:   Additional Equipment:   Intra-op Plan:   Post-operative Plan:   Informed Consent: I have reviewed the patients History and Physical, chart, labs and discussed the procedure including the risks, benefits and alternatives for the proposed anesthesia with the patient or authorized representative who has indicated his/her understanding and acceptance.     Plan Discussed with: CRNA  Anesthesia Plan Comments:         Anesthesia Quick Evaluation

## 2011-09-29 NOTE — H&P (View-Only) (Signed)
HPI She has a history of AV canal defect, s/p repair early in life. Review of her records shows she underwent MV replacement initially with heterograft and then 23 mm St Jude mechanical valve.  In May 1991 she underwent modified Konno operation with a 25 mm St Jude valve in the aortic position. In 1995 she had a 23 mm Carbomedics valve placed in the mitral position for a partially obstructed valve. 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 All surgeries were done at [facility].  She was discharged from [facility] on 12/16. She underwent MV replacement (3rd) with St Jude prosthesis. She also had TV reconstruction, MAZE procedure, isthmus ablation and reconstruction of her tricuspid valve.. Also had repair of ascending aortic tear. (this was patient's 7th stenotomy). I saw her in clinic back in December. Since in clinic last she has been cardioverted twice for atrial flutter.  Last episode was a couple wks ago. The patient just got back from [place].  She call from her trip to say that her HR was increased.  130s.  She denied dizziness.  Breathing is OK at rest.  Does give out easier.  No CP. She cut back on physical activity and had a fun time Since being home she denies CP. Allergies  Allergen Reactions  . Povidone-Iodine     Current Outpatient Prescriptions  Medication Sig Dispense Refill  . aspirin 81 MG tablet Take 81 mg by mouth daily.        . CYMBALTA 30 MG capsule TAKE 1 CAPSULE BY MOUTH EVERY DAY  30 capsule  0  . furosemide (LASIX) 40 MG tablet Take 40 mg by mouth daily.       . hydrochlorothiazide 50 MG tablet TAKE 1 TABLET BY MOUTH DAILY  30 tablet  0  . metoprolol succinate (TOPROL XL) 25 MG 24 hr tablet Take 1 tablet (25 mg total) by mouth daily.  30 tablet  11  . metoprolol tartrate (LOPRESSOR) 25 MG tablet Take 25 mg by mouth 3 (three) times daily.      . norethindrone-ethinyl estradiol (ORTHO-NOVUM 1-35 TAB,NORTREL 1-35 TAB) 1-35 MG-MCG per tablet Take 1 tablet by mouth daily.         . potassium chloride (K-DUR) 10 MEQ tablet Take two tablets by mouth once daily.      . spironolactone (ALDACTONE) 50 MG tablet TAKE 1 TABLET BY MOUTH DAILY  30 tablet  0  . warfarin (COUMADIN) 4 MG tablet TAKE 1 TABLET BY MOUTH DAILY  30 tablet  0  . zaleplon (SONATA) 10 MG capsule Take 10 mg by mouth as needed.       . oxyCODONE-acetaminophen (PERCOCET) 5-325 MG per tablet Take 5-325 mg by mouth as needed.        Past Medical History  Diagnosis Date  . 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 Migraine   . Depression   . Hepatitis C   . Atrioventricular canal (AVC), complete     Has had 7 sternotomies for repair of this  . Acute on chronic heart failure     periprosthetic mitral valve leak  . H/O mitral valve replacement     a. x 3 -  most recent 06/2011 - St. Jude MVR  . History of aortic valve replacement     a. 25mm SJM 1995  . H/O maze procedure     06/2011  . History of tricuspid valve repair       06/2011    Past Surgical History  Procedure Date  . Aorto ventricular canal defect repair     age 7 mths  . Mitral valve replacement 06/2011    Third mitral valve replacement  . Aortic valve replacement   . Sternotomy     She has had 7 sternotomy  . Cardioversion 08/17/2011    Procedure: CARDIOVERSION;  Surgeon: Jeffrey D Katz, MD;  Location: [facility] OR;  Service: Cardiovascular;  Laterality: N/A;  . Cardioversion 09/11/2011    Procedure: CARDIOVERSION;  Surgeon: Daryus Sowash V Miyah Hampshire, MD;  Location: [facility] OR;  Service: Cardiovascular;  Laterality: N/A;  Carioversion    Family History  Problem Relation Age of Onset  . Colon cancer    . Colon polyps    . Hyperlipidemia Mother   . Depression Mother   . Hyperlipidemia Father   . Hypertension Father     History   Social History  . Marital Status: Single    Spouse Name: N/A    Number of Children: N/A  . Years of Education: N/A   Occupational History  . legal specialist    Social History Main  Topics  . Smoking status: Never Smoker   . Smokeless tobacco: Never Used  . Alcohol Use: Yes     occasionally  . Drug Use: No  . Sexually Active: Yes   Other Topics Concern  . Not on file   Social History Narrative  . No narrative on file    Review of Systems:  All systems reviewed.  They are negative to the above problem except as previously stated.  Vital Signs: BP 110/52  Pulse 133  Ht 5' 4" (1.626 m)  Wt 126 lb (57.153 kg)  BMI 21.63 kg/m2  Physical Exam Patient is in NAD HEENT:  Normocephalic, atraumatic. EOMI, PERRLA.  Neck: JVP is normal. No thyromegaly. No bruits.  Lungs: clear to auscultation. No rales no wheezes.  Heart: Regular rate and rhythm. Normal S1, S2. No S3.  Crisp valve sounds. PMI not displaced.  Abdomen:  Supple, nontender. Normal bowel sounds. No masses. No hepatomegaly.  Extremities:   Good distal pulses throughout. No lower extremity edema.  Musculoskeletal :moving all extremities.  Neuro:   alert and oriented x3.  CN II-XII grossly intact.  EKG:  Probable atrial flutter  133 bpm.  RBBB.  LAFB.   Assessment and Plan:    

## 2011-10-02 ENCOUNTER — Ambulatory Visit (INDEPENDENT_AMBULATORY_CARE_PROVIDER_SITE_OTHER): Payer: BC Managed Care – PPO | Admitting: Internal Medicine

## 2011-10-02 ENCOUNTER — Encounter (HOSPITAL_COMMUNITY): Payer: BC Managed Care – PPO

## 2011-10-02 ENCOUNTER — Encounter (HOSPITAL_COMMUNITY): Payer: Self-pay | Admitting: Cardiovascular Disease

## 2011-10-02 DIAGNOSIS — Z9889 Other specified postprocedural states: Secondary | ICD-10-CM

## 2011-10-02 DIAGNOSIS — I824Y9 Acute embolism and thrombosis of unspecified deep veins of unspecified proximal lower extremity: Secondary | ICD-10-CM

## 2011-10-02 DIAGNOSIS — I4891 Unspecified atrial fibrillation: Secondary | ICD-10-CM

## 2011-10-02 DIAGNOSIS — Z954 Presence of other heart-valve replacement: Secondary | ICD-10-CM

## 2011-10-02 DIAGNOSIS — Q249 Congenital malformation of heart, unspecified: Secondary | ICD-10-CM

## 2011-10-02 DIAGNOSIS — D6859 Other primary thrombophilia: Secondary | ICD-10-CM

## 2011-10-02 DIAGNOSIS — I359 Nonrheumatic aortic valve disorder, unspecified: Secondary | ICD-10-CM

## 2011-10-02 DIAGNOSIS — I059 Rheumatic mitral valve disease, unspecified: Secondary | ICD-10-CM

## 2011-10-02 LAB — POCT INR: INR: 1.8

## 2011-10-02 NOTE — Patient Instructions (Signed)
  Latest dosing instructions   Total Sun Mon Tue Wed Thu Fri Sat   34 4 mg 6 mg 6 mg 6 mg 4 mg 4 mg 4 mg    (4 mg1) (4 mg1.5) (4 mg1.5) (4 mg1.5) (4 mg1) (4 mg1) (4 mg1)

## 2011-10-03 ENCOUNTER — Telehealth: Payer: Self-pay

## 2011-10-03 NOTE — Telephone Encounter (Signed)
Received phone call from Dr.Ross wanting patient to have INR tomorrow 10/04/11 early am.States patient will be driving to [place] to have ablation 10/05/11.

## 2011-10-04 ENCOUNTER — Ambulatory Visit: Payer: BC Managed Care – PPO

## 2011-10-04 ENCOUNTER — Encounter (HOSPITAL_COMMUNITY): Payer: BC Managed Care – PPO

## 2011-10-04 ENCOUNTER — Ambulatory Visit (INDEPENDENT_AMBULATORY_CARE_PROVIDER_SITE_OTHER): Payer: BC Managed Care – PPO | Admitting: *Deleted

## 2011-10-04 DIAGNOSIS — I824Y9 Acute embolism and thrombosis of unspecified deep veins of unspecified proximal lower extremity: Secondary | ICD-10-CM

## 2011-10-04 DIAGNOSIS — Q249 Congenital malformation of heart, unspecified: Secondary | ICD-10-CM

## 2011-10-04 DIAGNOSIS — Z9889 Other specified postprocedural states: Secondary | ICD-10-CM

## 2011-10-04 DIAGNOSIS — I4891 Unspecified atrial fibrillation: Secondary | ICD-10-CM

## 2011-10-04 DIAGNOSIS — I059 Rheumatic mitral valve disease, unspecified: Secondary | ICD-10-CM

## 2011-10-04 DIAGNOSIS — D6859 Other primary thrombophilia: Secondary | ICD-10-CM

## 2011-10-04 DIAGNOSIS — I359 Nonrheumatic aortic valve disorder, unspecified: Secondary | ICD-10-CM

## 2011-10-04 DIAGNOSIS — Z954 Presence of other heart-valve replacement: Secondary | ICD-10-CM

## 2011-10-04 LAB — POCT INR: INR: 2.5

## 2011-10-04 NOTE — Telephone Encounter (Signed)
Will forward to Dr. Ross.  

## 2011-10-05 ENCOUNTER — Telehealth (HOSPITAL_COMMUNITY): Payer: Self-pay | Admitting: *Deleted

## 2011-10-05 NOTE — Telephone Encounter (Signed)
Called and left message on voicemail for patient.  Inquiring regarding ablation scheduled.  Requested pt give rehab staff call back for an update

## 2011-10-06 ENCOUNTER — Encounter (HOSPITAL_COMMUNITY): Admission: RE | Admit: 2011-10-06 | Payer: BC Managed Care – PPO | Source: Ambulatory Visit

## 2011-10-06 NOTE — Telephone Encounter (Signed)
Pt returned call.  Pt had ablation in [place] on 10/05/11.  Pt given permission to return to exercise on 10/11/11.

## 2011-10-09 ENCOUNTER — Ambulatory Visit: Payer: BC Managed Care – PPO

## 2011-10-09 ENCOUNTER — Telehealth: Payer: Self-pay | Admitting: Internal Medicine

## 2011-10-09 ENCOUNTER — Encounter: Payer: Self-pay | Admitting: Internal Medicine

## 2011-10-09 ENCOUNTER — Encounter (HOSPITAL_COMMUNITY): Payer: BC Managed Care – PPO

## 2011-10-09 ENCOUNTER — Ambulatory Visit (INDEPENDENT_AMBULATORY_CARE_PROVIDER_SITE_OTHER): Payer: BC Managed Care – PPO | Admitting: Internal Medicine

## 2011-10-09 VITALS — BP 110/70 | HR 72 | Temp 99.0°F | Resp 16 | Ht 64.0 in | Wt 128.0 lb

## 2011-10-09 DIAGNOSIS — I059 Rheumatic mitral valve disease, unspecified: Secondary | ICD-10-CM

## 2011-10-09 DIAGNOSIS — D6859 Other primary thrombophilia: Secondary | ICD-10-CM

## 2011-10-09 DIAGNOSIS — I824Y9 Acute embolism and thrombosis of unspecified deep veins of unspecified proximal lower extremity: Secondary | ICD-10-CM

## 2011-10-09 DIAGNOSIS — Z9889 Other specified postprocedural states: Secondary | ICD-10-CM

## 2011-10-09 DIAGNOSIS — I359 Nonrheumatic aortic valve disorder, unspecified: Secondary | ICD-10-CM

## 2011-10-09 DIAGNOSIS — I4891 Unspecified atrial fibrillation: Secondary | ICD-10-CM

## 2011-10-09 DIAGNOSIS — Z5181 Encounter for therapeutic drug level monitoring: Secondary | ICD-10-CM

## 2011-10-09 DIAGNOSIS — Z7901 Long term (current) use of anticoagulants: Secondary | ICD-10-CM

## 2011-10-09 DIAGNOSIS — Q249 Congenital malformation of heart, unspecified: Secondary | ICD-10-CM

## 2011-10-09 DIAGNOSIS — Z954 Presence of other heart-valve replacement: Secondary | ICD-10-CM

## 2011-10-09 LAB — POCT INR: INR: 2.8

## 2011-10-09 NOTE — Telephone Encounter (Signed)
New msg Pt called and said her inr was 2.8 and wanted to know when should she be seen again. Please call

## 2011-10-09 NOTE — Progress Notes (Signed)
  Subjective:    Patient ID: Jessica [name], female    DOB: [birth year], 36 y.o.   MRN: [id number]  HPI Patient is a 36 year old female who presents for followup of open heart surgery.  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 She had a basic metabolic panel drawn a week ago and her potassium remained low at 3.4 her potassium supplementation was increased by her cardiologist to 30 mEq a day.  Generally she has been stable her blood pressure is stable her coloration is good her activity level is good.   Review of Systems  Constitutional: Negative for activity change, appetite change and fatigue.  HENT: Negative for ear pain, congestion, neck pain, postnasal drip and sinus pressure.   Eyes: Negative for redness and visual disturbance.  Respiratory: Negative for cough, shortness of breath and wheezing.   Gastrointestinal: Negative for abdominal pain and abdominal distention.  Genitourinary: Negative for dysuria, frequency and menstrual problem.  Musculoskeletal: Negative for myalgias, joint swelling and arthralgias.  Skin: Negative for rash and wound.  Neurological: Negative for dizziness, weakness and headaches.  Hematological: Negative for adenopathy. Does not bruise/bleed easily.  Psychiatric/Behavioral: Negative for sleep disturbance and decreased concentration.       Objective:   Physical Exam  Nursing note and vitals reviewed. Constitutional: She is oriented to person, place, and time. She appears well-developed and well-nourished. No distress.  HENT:  Head: Normocephalic and atraumatic.  Right Ear: External ear normal.  Left Ear: External ear normal.  Nose: Nose normal.  Mouth/Throat: Oropharynx is clear and moist.  Eyes: Conjunctivae and EOM are normal. Pupils are equal, round, and reactive to light.  Neck: Normal range of motion. Neck supple. No JVD present. No tracheal  deviation present. No thyromegaly present.  Cardiovascular: Normal rate.   Murmur heard.      Mechanical heart sounds appear to be normal there is no evidence of any perivalvular regurgitation carotid pulses are stable and lung examination reveals no edema  Pulmonary/Chest: Effort normal and breath sounds normal. She has no wheezes. She exhibits no tenderness.  Abdominal: Soft. Bowel sounds are normal.  Musculoskeletal: Normal range of motion. She exhibits no edema and no tenderness.  Lymphadenopathy:    She has no cervical adenopathy.  Neurological: She is alert and oriented to person, place, and time. She has normal reflexes. No cranial nerve deficit.  Skin: Skin is warm and dry. She is not diaphoretic.  Psychiatric: She has a normal mood and affect. Her behavior is normal.          Assessment & Plan:  This is stable on Cymbalta with good spirits. Potassium has been replaced by her cardiologist since this was done less than a week ago we'll not check at this time her ascites is well controlled with spironolactone and Lasix combination.  Your sounds are very stable do not hear any evidence of heart failure

## 2011-10-09 NOTE — Patient Instructions (Signed)
  Latest dosing instructions   Total Sun Mon Tue Wed Thu Fri Sat   28 4 mg 4 mg 4 mg 4 mg 4 mg 4 mg 4 mg    (4 mg1) (4 mg1) (4 mg1) (4 mg1) (4 mg1) (4 mg1) (4 mg1)        

## 2011-10-11 ENCOUNTER — Other Ambulatory Visit: Payer: Self-pay | Admitting: Internal Medicine

## 2011-10-11 ENCOUNTER — Encounter (HOSPITAL_COMMUNITY)
Admission: RE | Admit: 2011-10-11 | Discharge: 2011-10-11 | Disposition: A | Discharge status: Home / Self Care | Payer: BC Managed Care – PPO | Source: Ambulatory Visit | Attending: Internal Medicine | Admitting: Internal Medicine

## 2011-10-11 DIAGNOSIS — E785 Hyperlipidemia, unspecified: Secondary | ICD-10-CM | POA: Insufficient documentation

## 2011-10-11 DIAGNOSIS — Z954 Presence of other heart-valve replacement: Secondary | ICD-10-CM | POA: Insufficient documentation

## 2011-10-11 DIAGNOSIS — Q249 Congenital malformation of heart, unspecified: Secondary | ICD-10-CM | POA: Insufficient documentation

## 2011-10-11 DIAGNOSIS — Z5189 Encounter for other specified aftercare: Secondary | ICD-10-CM | POA: Insufficient documentation

## 2011-10-11 DIAGNOSIS — F411 Generalized anxiety disorder: Secondary | ICD-10-CM | POA: Insufficient documentation

## 2011-10-11 DIAGNOSIS — I359 Nonrheumatic aortic valve disorder, unspecified: Secondary | ICD-10-CM | POA: Insufficient documentation

## 2011-10-11 DIAGNOSIS — I824Y9 Acute embolism and thrombosis of unspecified deep veins of unspecified proximal lower extremity: Secondary | ICD-10-CM | POA: Insufficient documentation

## 2011-10-11 DIAGNOSIS — I059 Rheumatic mitral valve disease, unspecified: Secondary | ICD-10-CM | POA: Insufficient documentation

## 2011-10-11 DIAGNOSIS — I499 Cardiac arrhythmia, unspecified: Secondary | ICD-10-CM | POA: Insufficient documentation

## 2011-10-11 DIAGNOSIS — D599 Acquired hemolytic anemia, unspecified: Secondary | ICD-10-CM | POA: Insufficient documentation

## 2011-10-11 DIAGNOSIS — I4891 Unspecified atrial fibrillation: Secondary | ICD-10-CM | POA: Insufficient documentation

## 2011-10-12 NOTE — Telephone Encounter (Signed)
Set her up to be seen next week or in 3  (gone easter week)

## 2011-10-13 ENCOUNTER — Encounter (HOSPITAL_COMMUNITY)
Admission: RE | Admit: 2011-10-13 | Discharge: 2011-10-13 | Disposition: A | Discharge status: Home / Self Care | Payer: BC Managed Care – PPO | Source: Ambulatory Visit | Attending: Internal Medicine | Admitting: Internal Medicine

## 2011-10-13 NOTE — Telephone Encounter (Signed)
LMOM to call back to schedule follow up visit.

## 2011-10-16 ENCOUNTER — Encounter (HOSPITAL_COMMUNITY)
Admission: RE | Admit: 2011-10-16 | Discharge: 2011-10-16 | Disposition: A | Discharge status: Home / Self Care | Payer: BC Managed Care – PPO | Source: Ambulatory Visit | Attending: Internal Medicine | Admitting: Internal Medicine

## 2011-10-17 ENCOUNTER — Telehealth: Payer: Self-pay | Admitting: Internal Medicine

## 2011-10-17 NOTE — Telephone Encounter (Signed)
Follow-up:     Patient returned your phone call to scheduled an appointment. Please call back. Patient understood that you were off today.

## 2011-10-18 ENCOUNTER — Encounter (HOSPITAL_COMMUNITY)
Admission: RE | Admit: 2011-10-18 | Discharge: 2011-10-18 | Disposition: A | Discharge status: Home / Self Care | Payer: BC Managed Care – PPO | Source: Ambulatory Visit | Attending: Internal Medicine | Admitting: Internal Medicine

## 2011-10-18 NOTE — Telephone Encounter (Signed)
Called patient and made appointment for her to see Dr.Ross on 4/22 at 845 am.

## 2011-10-18 NOTE — Telephone Encounter (Signed)
See note from 3/27.

## 2011-10-20 ENCOUNTER — Encounter (HOSPITAL_COMMUNITY)
Admission: RE | Admit: 2011-10-20 | Discharge: 2011-10-20 | Disposition: A | Discharge status: Home / Self Care | Payer: BC Managed Care – PPO | Source: Ambulatory Visit | Attending: Internal Medicine | Admitting: Internal Medicine

## 2011-10-23 ENCOUNTER — Encounter (HOSPITAL_COMMUNITY): Payer: BC Managed Care – PPO

## 2011-10-25 ENCOUNTER — Encounter (HOSPITAL_COMMUNITY)
Admission: RE | Admit: 2011-10-25 | Discharge: 2011-10-25 | Disposition: A | Discharge status: Home / Self Care | Payer: BC Managed Care – PPO | Source: Ambulatory Visit | Attending: Internal Medicine | Admitting: Internal Medicine

## 2011-10-25 DIAGNOSIS — D599 Acquired hemolytic anemia, unspecified: Secondary | ICD-10-CM | POA: Insufficient documentation

## 2011-10-25 DIAGNOSIS — I499 Cardiac arrhythmia, unspecified: Secondary | ICD-10-CM | POA: Insufficient documentation

## 2011-10-25 DIAGNOSIS — I824Y9 Acute embolism and thrombosis of unspecified deep veins of unspecified proximal lower extremity: Secondary | ICD-10-CM | POA: Insufficient documentation

## 2011-10-25 DIAGNOSIS — Z954 Presence of other heart-valve replacement: Secondary | ICD-10-CM | POA: Insufficient documentation

## 2011-10-25 DIAGNOSIS — I359 Nonrheumatic aortic valve disorder, unspecified: Secondary | ICD-10-CM | POA: Insufficient documentation

## 2011-10-25 DIAGNOSIS — F411 Generalized anxiety disorder: Secondary | ICD-10-CM | POA: Insufficient documentation

## 2011-10-25 DIAGNOSIS — Q249 Congenital malformation of heart, unspecified: Secondary | ICD-10-CM | POA: Insufficient documentation

## 2011-10-25 DIAGNOSIS — I4891 Unspecified atrial fibrillation: Secondary | ICD-10-CM | POA: Insufficient documentation

## 2011-10-25 DIAGNOSIS — E785 Hyperlipidemia, unspecified: Secondary | ICD-10-CM | POA: Insufficient documentation

## 2011-10-25 DIAGNOSIS — Z5189 Encounter for other specified aftercare: Secondary | ICD-10-CM | POA: Insufficient documentation

## 2011-10-25 DIAGNOSIS — I059 Rheumatic mitral valve disease, unspecified: Secondary | ICD-10-CM | POA: Insufficient documentation

## 2011-10-27 ENCOUNTER — Encounter (HOSPITAL_COMMUNITY)
Admission: RE | Admit: 2011-10-27 | Discharge: 2011-10-27 | Disposition: A | Discharge status: Home / Self Care | Payer: BC Managed Care – PPO | Source: Ambulatory Visit | Attending: Internal Medicine | Admitting: Internal Medicine

## 2011-10-30 ENCOUNTER — Encounter: Payer: Self-pay | Admitting: Internal Medicine

## 2011-10-30 ENCOUNTER — Ambulatory Visit (INDEPENDENT_AMBULATORY_CARE_PROVIDER_SITE_OTHER): Payer: BC Managed Care – PPO | Admitting: Internal Medicine

## 2011-10-30 ENCOUNTER — Encounter (HOSPITAL_COMMUNITY)
Admission: RE | Admit: 2011-10-30 | Discharge: 2011-10-30 | Disposition: A | Discharge status: Home / Self Care | Payer: BC Managed Care – PPO | Source: Ambulatory Visit | Attending: Internal Medicine | Admitting: Internal Medicine

## 2011-10-30 VITALS — BP 120/70 | HR 68 | Temp 98.8°F | Resp 14 | Ht 64.0 in | Wt 128.0 lb

## 2011-10-30 DIAGNOSIS — Q249 Congenital malformation of heart, unspecified: Secondary | ICD-10-CM

## 2011-10-30 DIAGNOSIS — R509 Fever, unspecified: Secondary | ICD-10-CM

## 2011-10-30 DIAGNOSIS — J329 Chronic sinusitis, unspecified: Secondary | ICD-10-CM

## 2011-10-30 DIAGNOSIS — J349 Unspecified disorder of nose and nasal sinuses: Secondary | ICD-10-CM

## 2011-10-30 MED ORDER — DOXYCYCLINE HYCLATE 100 MG PO TABS: 100.0000 mg | ORAL_TABLET | Freq: Two times a day (BID) | ORAL | Status: AC

## 2011-10-30 MED ORDER — NORETHINDRONE-ETH ESTRADIOL 1-35 MG-MCG PO TABS: 1.0000 | ORAL_TABLET | Freq: Every day | ORAL | Status: DC

## 2011-10-30 NOTE — Patient Instructions (Addendum)
claritin 10 mg daily Salt water mist in nostril

## 2011-10-30 NOTE — Progress Notes (Signed)
Subjective:    Patient ID: Jessica [name], female    DOB: 1975-11-10, 36 y.o.   MRN: [id number]  HPI This is a 36 year old female with a history of congenital heart disease status post minute multiple open heart surgeries who presents with a low-grade fever and congestion.  She was 98 8 at the beginning of the interview when rechecked was 99 and she appeared to be flushed.  A concern that she may have a viral or flulike illness but we'll treat her  Her sinusitis because of her fragile condition.     Review of Systems  Constitutional: Positive for chills. Negative for activity change, appetite change and fatigue.  HENT: Positive for congestion, rhinorrhea and postnasal drip. Negative for ear pain, neck pain and sinus pressure.   Eyes: Negative for redness and visual disturbance.  Respiratory: Negative for cough, shortness of breath and wheezing.   Gastrointestinal: Negative for abdominal pain and abdominal distention.  Genitourinary: Negative for dysuria, frequency and menstrual problem.  Musculoskeletal: Negative for myalgias, joint swelling and arthralgias.  Skin: Negative for rash and wound.  Neurological: Negative for dizziness, weakness and headaches.  Hematological: Negative for adenopathy. Does not bruise/bleed easily.  Psychiatric/Behavioral: Negative for sleep disturbance and decreased concentration.   Past Medical History  Diagnosis Date  . 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 Migraine   . Depression   . Hepatitis C   . Atrioventricular canal (AVC), complete     Has had 7 sternotomies for repair of this  . Acute on chronic heart failure     periprosthetic mitral valve leak  . H/O mitral valve replacement     a. x 3 -  most recent 06/2011 - St. Jude MVR  . History of aortic valve replacement     a. 25mm SJM 1995  . H/O maze procedure     06/2011  . History of tricuspid valve repair     06/2011    History   Social History  .  Marital Status: Single    Spouse Name: N/A    Number of Children: N/A  . Years of Education: N/A   Occupational History  . legal specialist    Social History Main Topics  . Smoking status: Never Smoker   . Smokeless tobacco: Never Used  . Alcohol Use: Yes     occasionally  . Drug Use: No  . Sexually Active: Yes   Other Topics Concern  . Not on file   Social History Narrative  . No narrative on file    Past Surgical History  Procedure Date  . Aorto ventricular canal defect repair     age 35 mths  . Mitral valve replacement 06/2011    Third mitral valve replacement  . Aortic valve replacement   . Sternotomy     She has had 7 sternotomy  . Cardioversion 08/17/2011    Procedure: CARDIOVERSION;  Surgeon: Luis Abed, MD;  Location: [facility] OR;  Service: Cardiovascular;  Laterality: N/A;  . Cardioversion 09/11/2011    Procedure: CARDIOVERSION;  Surgeon: Pricilla Riffle, MD;  Location: [facility] OR;  Service: Cardiovascular;  Laterality: N/A;  Carioversion  . Cardioversion 09/29/2011    Procedure: CARDIOVERSION;  Surgeon: Vesta Mixer, MD;  Location: [facility] OR;  Service: Cardiovascular;  Laterality: N/A;    Family History  Problem Relation Age of Onset  . Colon cancer    . Colon polyps    . Hyperlipidemia Mother   .  Depression Mother   . Hyperlipidemia Father   . Hypertension Father     Allergies  Allergen Reactions  . Povidone-Iodine Other (See Comments)    Sunburn. Really bad. Do not use for cath procedures    Current Outpatient Prescriptions on File Prior to Visit  Medication Sig Dispense Refill  . aspirin 81 MG tablet Take 81 mg by mouth daily.        . DULoxetine (CYMBALTA) 30 MG capsule Take 30 mg by mouth daily.      . furosemide (LASIX) 40 MG tablet Take 40 mg by mouth daily.       . hydrochlorothiazide (HYDRODIURIL) 50 MG tablet Take 50 mg by mouth daily.      . metoprolol succinate (TOPROL-XL) 25 MG 24 hr tablet Take 25 mg by mouth daily.      . metoprolol tartrate  (LOPRESSOR) 25 MG tablet Take 25 mg by mouth 3 (three) times daily.      . potassium chloride (K-DUR) 10 MEQ tablet Take 30 mEq by mouth daily.       Marland Kitchen spironolactone (ALDACTONE) 50 MG tablet TAKE 1 TABLET BY MOUTH DAILY  30 tablet  0  . warfarin (COUMADIN) 4 MG tablet TAKE 1 TABLET BY MOUTH DAILY  30 tablet  0  . DISCONTD: norethindrone-ethinyl estradiol (ORTHO-NOVUM 1-35 TAB,NORTREL 1-35 TAB) 1-35 MG-MCG per tablet Take 1 tablet by mouth daily.        Marland Kitchen DISCONTD: zaleplon (SONATA) 10 MG capsule Take 1 capsule (10 mg total) by mouth at bedtime.  12 capsule  1  . DISCONTD: zaleplon (SONATA) 10 MG capsule Take 10 mg by mouth at bedtime as needed. For sleep        BP 120/70  Pulse 68  Temp 98.8 F (37.1 C)  Resp 14  Ht 5\' 4"  (1.626 m)  Wt 128 lb (58.06 kg)  BMI 21.97 kg/m2       Objective:   Physical Exam  Nursing note and vitals reviewed. Constitutional: She is oriented to person, place, and time. She appears well-developed and well-nourished. No distress.  HENT:  Head: Normocephalic and atraumatic.  Right Ear: External ear normal.  Left Ear: External ear normal.  Nose: Nose normal.  Mouth/Throat: Oropharynx is clear and moist.  Eyes: Conjunctivae and EOM are normal. Pupils are equal, round, and reactive to light.  Neck: Normal range of motion. Neck supple. No JVD present. No tracheal deviation present. No thyromegaly present.  Cardiovascular: Normal rate, regular rhythm, normal heart sounds and intact distal pulses.   No murmur heard. Pulmonary/Chest: Effort normal and breath sounds normal. She has no wheezes. She exhibits no tenderness.  Abdominal: Soft. Bowel sounds are normal.  Musculoskeletal: Normal range of motion. She exhibits no edema and no tenderness.  Lymphadenopathy:    She has no cervical adenopathy.  Neurological: She is alert and oriented to person, place, and time. She has normal reflexes. No cranial nerve deficit.  Skin: Skin is warm and dry. She is not  diaphoretic.  Psychiatric: She has a normal mood and affect. Her behavior is normal.          Assessment & Plan:  Acute viral illness versus bacterial sinusitis treat with doxycycline 100 mg by mouth twice a day for 10 days monitor for symptoms this is a patient that I would not hesitate to prescribe toe fluids and should she spike a temperature greater than 101.  We've informed her of this and will monitor her carefully.  She  also has seen a gynecologist in the past was prescribed birth control she's been stable respiratory motion Coumadin for years.  We refilled her birth control for her since she cannot see her gynecologist until after August.

## 2011-11-01 ENCOUNTER — Encounter (HOSPITAL_COMMUNITY)
Admission: RE | Admit: 2011-11-01 | Discharge: 2011-11-01 | Disposition: A | Discharge status: Home / Self Care | Payer: BC Managed Care – PPO | Source: Ambulatory Visit | Attending: Internal Medicine | Admitting: Internal Medicine

## 2011-11-03 ENCOUNTER — Encounter (HOSPITAL_COMMUNITY)
Admission: RE | Admit: 2011-11-03 | Discharge: 2011-11-03 | Disposition: A | Discharge status: Home / Self Care | Payer: BC Managed Care – PPO | Source: Ambulatory Visit | Attending: Internal Medicine | Admitting: Internal Medicine

## 2011-11-06 ENCOUNTER — Encounter (HOSPITAL_COMMUNITY)
Admission: RE | Admit: 2011-11-06 | Discharge: 2011-11-06 | Disposition: A | Discharge status: Home / Self Care | Payer: BC Managed Care – PPO | Source: Ambulatory Visit | Attending: Internal Medicine | Admitting: Internal Medicine

## 2011-11-08 ENCOUNTER — Telehealth: Payer: Self-pay | Admitting: Internal Medicine

## 2011-11-08 ENCOUNTER — Encounter (HOSPITAL_COMMUNITY)
Admission: RE | Admit: 2011-11-08 | Discharge: 2011-11-08 | Disposition: A | Discharge status: Home / Self Care | Payer: BC Managed Care – PPO | Source: Ambulatory Visit | Attending: Internal Medicine | Admitting: Internal Medicine

## 2011-11-08 ENCOUNTER — Other Ambulatory Visit: Payer: Self-pay | Admitting: *Deleted

## 2011-11-08 DIAGNOSIS — R000 Tachycardia, unspecified: Secondary | ICD-10-CM

## 2011-11-08 NOTE — Telephone Encounter (Signed)
New Problem:     Patient called in saying that she is having short runs of tachycardia and wanted to know if she could get a 24-hr monitor placed. Please call back.

## 2011-11-08 NOTE — Telephone Encounter (Signed)
Pt calling stating runs of tachcardia and she'd like to wear 24 hour holter--OK per dr [name] (DOD) for pt to wear monitor--orders are in--pt notified on identified voice mail that ruth will call her when we have one available--nt

## 2011-11-09 ENCOUNTER — Encounter: Payer: Self-pay | Admitting: Internal Medicine

## 2011-11-09 NOTE — Telephone Encounter (Signed)
Holter monitor placed on today.

## 2011-11-10 ENCOUNTER — Encounter (HOSPITAL_COMMUNITY)
Admission: RE | Admit: 2011-11-10 | Discharge: 2011-11-10 | Disposition: A | Discharge status: Home / Self Care | Payer: BC Managed Care – PPO | Source: Ambulatory Visit | Attending: Internal Medicine | Admitting: Internal Medicine

## 2011-11-10 ENCOUNTER — Other Ambulatory Visit: Payer: Self-pay | Admitting: Internal Medicine

## 2011-11-13 ENCOUNTER — Telehealth: Payer: Self-pay | Admitting: *Deleted

## 2011-11-13 ENCOUNTER — Encounter: Payer: Self-pay | Admitting: Internal Medicine

## 2011-11-13 ENCOUNTER — Ambulatory Visit (INDEPENDENT_AMBULATORY_CARE_PROVIDER_SITE_OTHER): Payer: BC Managed Care – PPO | Admitting: Internal Medicine

## 2011-11-13 ENCOUNTER — Encounter (HOSPITAL_COMMUNITY)
Admission: RE | Admit: 2011-11-13 | Discharge: 2011-11-13 | Disposition: A | Discharge status: Home / Self Care | Payer: BC Managed Care – PPO | Source: Ambulatory Visit | Attending: Internal Medicine | Admitting: Internal Medicine

## 2011-11-13 VITALS — BP 117/70 | HR 80 | Ht 64.0 in | Wt 132.8 lb

## 2011-11-13 DIAGNOSIS — R0602 Shortness of breath: Secondary | ICD-10-CM

## 2011-11-13 DIAGNOSIS — I4891 Unspecified atrial fibrillation: Secondary | ICD-10-CM

## 2011-11-13 LAB — BASIC METABOLIC PANEL
CO2: 31 mEq/L (ref 19–32)
Calcium: 9.5 mg/dL (ref 8.4–10.5)
Creatinine, Ser: 0.8 mg/dL (ref 0.4–1.2)
GFR: 90.82 mL/min (ref >60.00)

## 2011-11-13 LAB — PROTIME-INR
INR: 2.8 ratio — ABNORMAL HIGH (ref 0.8–1.0)
Prothrombin Time: 30.5 s — ABNORMAL HIGH (ref 10.2–12.4)

## 2011-11-13 LAB — CBC WITH DIFFERENTIAL/PLATELET
Eosinophils Absolute: 0 10*3/uL (ref 0.0–0.7)
Eosinophils Relative: 0.6 % (ref 0.0–5.0)
MCV: 92 fl (ref 78.0–100.0)
Monocytes Absolute: 0.3 10*3/uL (ref 0.1–1.0)
Neutrophils Relative %: 76.7 % (ref 43.0–77.0)
Platelets: 173 10*3/uL (ref 150.0–400.0)
WBC: 5.4 10*3/uL (ref 4.5–10.5)

## 2011-11-13 NOTE — Telephone Encounter (Signed)
Called patient concerning lab results and advised all results WNL. She will stay on the same dose of Coumadin per Dr.Ross and make an appointment in the [facility] office for an INR check in 1 month.

## 2011-11-13 NOTE — Progress Notes (Signed)
HPI Patient is a 36 year old with a history of AV canal defect s/p repair in early life.  In May 1991 she underwent modified Konno operation with a 25 mm St Jude valve in the aortic position. In 1995 she had a 23 mm Carbomedics valve placed in the mitral position for a partially obstructed valve. 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 All surgeries were done at [facility]. In December 2012 she underwent MV replacement (3rd) with St Jude prosthesis. She also had TV reconstruction, MAZE procedure, isthmus ablation and reconstruction of her tricuspid valve.. Also had repair of ascending aortic tear.  I last saw her in clinic in early March.  She has since undergone atrial flutter ablation at [facility]. Initially she did very well with no furhter palpitations.  Over the past week, however, she has noticed the onset of palpitations with associated SOB.  She has had no syncope. She called in for a monitor has has just turned it in for review. When her heart is not racing, she feels good.  Denies SOB  No CP.  Exercise tolerance is increasing, again, when HR is not racing.  Allergies  Allergen Reactions  . Povidone-Iodine Other (See Comments)    Sunburn. Really bad. Do not use for cath procedures    Current Outpatient Prescriptions  Medication Sig Dispense Refill  . aspirin 81 MG tablet Take 81 mg by mouth daily.        Marland Kitchen doxycycline (VIBRA-TABS) 100 MG tablet Take 50 mg by mouth 2 (two) times daily. Taking for 2weeks      . DULoxetine (CYMBALTA) 30 MG capsule Take 30 mg by mouth daily.      . furosemide (LASIX) 40 MG tablet Take 40 mg by mouth daily.       . hydrochlorothiazide (HYDRODIURIL) 50 MG tablet Take 50 mg by mouth daily.      . metoprolol succinate (TOPROL-XL) 25 MG 24 hr tablet Take 25 mg by mouth daily.      . norethindrone-ethinyl estradiol 1/35 (ORTHO-NOVUM, NORTREL,CYCLAFEM) tablet Take 1 tablet by mouth daily.  1 Package  5  . potassium chloride (K-DUR) 10 MEQ tablet Take 30 mEq  by mouth daily.       Marland Kitchen spironolactone (ALDACTONE) 50 MG tablet TAKE 1 TABLET BY MOUTH DAILY  30 tablet  0  . warfarin (COUMADIN) 4 MG tablet TAKE 1 TABLET BY MOUTH DAILY  30 tablet  0  . zaleplon (SONATA) 10 MG capsule Take 10 mg by mouth at bedtime.        Past Medical History  Diagnosis Date  . 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 Migraine   . Depression   . Hepatitis C   . Atrioventricular canal (AVC), complete     Has had 7 sternotomies for repair of this  . Acute on chronic heart failure     periprosthetic mitral valve leak  . H/O mitral valve replacement     a. x 3 -  most recent 06/2011 - St. Jude MVR  . History of aortic valve replacement     a. 25mm SJM 1995  . H/O maze procedure     06/2011  . 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 Aorto ventricular canal defect repair     age 38 mths  . Mitral valve replacement 06/2011    Third  mitral valve replacement  . Aortic valve replacement   . Sternotomy     She has had 7 sternotomy  . Cardioversion 08/17/2011    Procedure: CARDIOVERSION;  Surgeon: Luis Abed, MD;  Location: [facility] OR;  Service: Cardiovascular;  Laterality: N/A;  . Cardioversion 09/11/2011    Procedure: CARDIOVERSION;  Surgeon: Pricilla Riffle, MD;  Location: [facility] OR;  Service: Cardiovascular;  Laterality: N/A;  Carioversion  . Cardioversion 09/29/2011    Procedure: CARDIOVERSION;  Surgeon: Vesta Mixer, MD;  Location: [facility] OR;  Service: Cardiovascular;  Laterality: N/A;    Family History  Problem Relation Age of Onset  . Colon cancer    . Colon polyps    . Hyperlipidemia Mother   . Depression Mother   . Hyperlipidemia Father   . Hypertension Father     History   Social History  . Marital Status: Single    Spouse Name: N/A    Number of Children: N/A  . Years of Education: N/A   Occupational History  . legal specialist    Social History Main Topics  . Smoking  status: Never Smoker   . Smokeless tobacco: Never Used  . Alcohol Use: Yes     occasionally  . Drug Use: No  . Sexually Active: Yes   Other Topics Concern  . Not on file   Social History Narrative  . No narrative on file    Review of Systems:  All systems reviewed.  They are negative to the above problem except as previously stated.  Vital Signs: There were no vitals taken for this visit.  Physical Exam Patient is in NAD HEENT:  Normocephalic, atraumatic. EOMI, PERRLA.  Neck: JVP is normal. Lungs: clear to auscultation. No rales no wheezes.  Heart: Regular rate and rhythm. Normal S1, S2. No S3.   Crisp valve sounds  Abdomen:  Supple, nontender. Normal bowel sounds. No masses. No hepatomegaly.  Extremities:    No lower extremity edema.   Assessment and Plan:  1.Palpitations.  Holter monitor is through e cardio.  THere are 2 periods (around 8 AM and 9 AM) where there is drop out on the monitor.  Company rep reports extensive artifact.  Just before 8 AM her HR is increased, suspicous for atrial flutter.  Difficult to see.  Latecia says she was a cardiac rehab at this time and did sense palpitations  What I have recomm is that she wear another monitor from our office that we can analyze.  Will set up a no charge. Will check labs today. Continue acitvities as tolerated.  Overall volume status looks good.

## 2011-11-13 NOTE — Patient Instructions (Signed)
Lab work today. Will call you with results. 

## 2011-11-15 ENCOUNTER — Encounter (HOSPITAL_COMMUNITY): Admission: RE | Admit: 2011-11-15 | Payer: BC Managed Care – PPO | Source: Ambulatory Visit

## 2011-11-17 ENCOUNTER — Encounter (HOSPITAL_COMMUNITY)
Admission: RE | Admit: 2011-11-17 | Discharge: 2011-11-17 | Disposition: A | Discharge status: Home / Self Care | Payer: BC Managed Care – PPO | Source: Ambulatory Visit | Attending: Internal Medicine | Admitting: Internal Medicine

## 2011-11-17 ENCOUNTER — Other Ambulatory Visit: Payer: Self-pay | Admitting: Internal Medicine

## 2011-11-20 ENCOUNTER — Encounter (HOSPITAL_COMMUNITY): Payer: BC Managed Care – PPO

## 2011-11-22 ENCOUNTER — Encounter (HOSPITAL_COMMUNITY)
Admission: RE | Admit: 2011-11-22 | Discharge: 2011-11-22 | Disposition: A | Discharge status: Home / Self Care | Payer: BC Managed Care – PPO | Source: Ambulatory Visit | Attending: Internal Medicine | Admitting: Internal Medicine

## 2011-11-22 DIAGNOSIS — D599 Acquired hemolytic anemia, unspecified: Secondary | ICD-10-CM | POA: Insufficient documentation

## 2011-11-22 DIAGNOSIS — F411 Generalized anxiety disorder: Secondary | ICD-10-CM | POA: Insufficient documentation

## 2011-11-22 DIAGNOSIS — Z954 Presence of other heart-valve replacement: Secondary | ICD-10-CM | POA: Insufficient documentation

## 2011-11-22 DIAGNOSIS — I359 Nonrheumatic aortic valve disorder, unspecified: Secondary | ICD-10-CM | POA: Insufficient documentation

## 2011-11-22 DIAGNOSIS — I059 Rheumatic mitral valve disease, unspecified: Secondary | ICD-10-CM | POA: Insufficient documentation

## 2011-11-22 DIAGNOSIS — I499 Cardiac arrhythmia, unspecified: Secondary | ICD-10-CM | POA: Insufficient documentation

## 2011-11-22 DIAGNOSIS — I4891 Unspecified atrial fibrillation: Secondary | ICD-10-CM | POA: Insufficient documentation

## 2011-11-22 DIAGNOSIS — E785 Hyperlipidemia, unspecified: Secondary | ICD-10-CM | POA: Insufficient documentation

## 2011-11-22 DIAGNOSIS — Z5189 Encounter for other specified aftercare: Secondary | ICD-10-CM | POA: Insufficient documentation

## 2011-11-22 DIAGNOSIS — Q249 Congenital malformation of heart, unspecified: Secondary | ICD-10-CM | POA: Insufficient documentation

## 2011-11-22 DIAGNOSIS — I824Y9 Acute embolism and thrombosis of unspecified deep veins of unspecified proximal lower extremity: Secondary | ICD-10-CM | POA: Insufficient documentation

## 2011-11-24 ENCOUNTER — Encounter (HOSPITAL_COMMUNITY): Payer: BC Managed Care – PPO

## 2011-11-27 ENCOUNTER — Encounter (HOSPITAL_COMMUNITY)
Admission: RE | Admit: 2011-11-27 | Discharge: 2011-11-27 | Disposition: A | Discharge status: Home / Self Care | Payer: BC Managed Care – PPO | Source: Ambulatory Visit | Attending: Internal Medicine | Admitting: Internal Medicine

## 2011-11-27 NOTE — Progress Notes (Signed)
Pt with medication change.  Metoprolol increased to 25 mg twice a day. Continue to monitor HR.

## 2011-11-29 ENCOUNTER — Encounter (HOSPITAL_COMMUNITY)
Admission: RE | Admit: 2011-11-29 | Discharge: 2011-11-29 | Disposition: A | Discharge status: Home / Self Care | Payer: BC Managed Care – PPO | Source: Ambulatory Visit | Attending: Internal Medicine | Admitting: Internal Medicine

## 2011-12-01 ENCOUNTER — Encounter (HOSPITAL_COMMUNITY)
Admission: RE | Admit: 2011-12-01 | Discharge: 2011-12-01 | Disposition: A | Discharge status: Home / Self Care | Payer: BC Managed Care – PPO | Source: Ambulatory Visit | Attending: Internal Medicine | Admitting: Internal Medicine

## 2011-12-04 ENCOUNTER — Encounter (HOSPITAL_COMMUNITY)
Admission: RE | Admit: 2011-12-04 | Discharge: 2011-12-04 | Disposition: A | Discharge status: Home / Self Care | Payer: BC Managed Care – PPO | Source: Ambulatory Visit | Attending: Internal Medicine | Admitting: Internal Medicine

## 2011-12-06 ENCOUNTER — Encounter (HOSPITAL_COMMUNITY)
Admission: RE | Admit: 2011-12-06 | Discharge: 2011-12-06 | Disposition: A | Discharge status: Home / Self Care | Payer: BC Managed Care – PPO | Source: Ambulatory Visit | Attending: Internal Medicine | Admitting: Internal Medicine

## 2011-12-08 ENCOUNTER — Telehealth: Payer: Self-pay | Admitting: *Deleted

## 2011-12-08 ENCOUNTER — Encounter (HOSPITAL_COMMUNITY)
Admission: RE | Admit: 2011-12-08 | Discharge: 2011-12-08 | Disposition: A | Discharge status: Home / Self Care | Payer: BC Managed Care – PPO | Source: Ambulatory Visit | Attending: Internal Medicine | Admitting: Internal Medicine

## 2011-12-08 NOTE — Telephone Encounter (Signed)
Called patient and advised that I have booked her admission to [facility] unit [address] for 12/16/2011 for a 3 day stay for Sotolol therapy. Admitting office will call her on 5/25 in the Am with a time for her to arrive.

## 2011-12-11 ENCOUNTER — Encounter (HOSPITAL_COMMUNITY)
Admission: RE | Admit: 2011-12-11 | Discharge: 2011-12-11 | Disposition: A | Discharge status: Home / Self Care | Payer: BC Managed Care – PPO | Source: Ambulatory Visit | Attending: Internal Medicine | Admitting: Internal Medicine

## 2011-12-12 ENCOUNTER — Other Ambulatory Visit: Payer: Self-pay | Admitting: Internal Medicine

## 2011-12-13 ENCOUNTER — Encounter (HOSPITAL_COMMUNITY)
Admission: RE | Admit: 2011-12-13 | Discharge: 2011-12-13 | Disposition: A | Discharge status: Home / Self Care | Payer: BC Managed Care – PPO | Source: Ambulatory Visit | Attending: Internal Medicine | Admitting: Internal Medicine

## 2011-12-15 ENCOUNTER — Encounter (HOSPITAL_COMMUNITY)
Admission: RE | Admit: 2011-12-15 | Discharge: 2011-12-15 | Disposition: A | Discharge status: Home / Self Care | Payer: BC Managed Care – PPO | Source: Ambulatory Visit | Attending: Internal Medicine | Admitting: Internal Medicine

## 2011-12-16 ENCOUNTER — Inpatient Hospital Stay (HOSPITAL_COMMUNITY)
Admission: RE | Admit: 2011-12-16 | Discharge: 2011-12-18 | DRG: 138 | Disposition: A | Discharge status: Home / Self Care | Payer: BC Managed Care – PPO | Source: Ambulatory Visit | Attending: Internal Medicine | Admitting: Internal Medicine

## 2011-12-16 ENCOUNTER — Encounter (HOSPITAL_COMMUNITY): Payer: Self-pay | Admitting: *Deleted

## 2011-12-16 DIAGNOSIS — I059 Rheumatic mitral valve disease, unspecified: Secondary | ICD-10-CM

## 2011-12-16 DIAGNOSIS — Z954 Presence of other heart-valve replacement: Secondary | ICD-10-CM

## 2011-12-16 DIAGNOSIS — B1920 Unspecified viral hepatitis C without hepatic coma: Secondary | ICD-10-CM | POA: Diagnosis present

## 2011-12-16 DIAGNOSIS — I824Y9 Acute embolism and thrombosis of unspecified deep veins of unspecified proximal lower extremity: Secondary | ICD-10-CM

## 2011-12-16 DIAGNOSIS — Z9889 Other specified postprocedural states: Secondary | ICD-10-CM | POA: Insufficient documentation

## 2011-12-16 DIAGNOSIS — Z952 Presence of prosthetic heart valve: Secondary | ICD-10-CM | POA: Insufficient documentation

## 2011-12-16 DIAGNOSIS — G43909 Migraine, unspecified, not intractable, without status migrainosus: Secondary | ICD-10-CM | POA: Diagnosis present

## 2011-12-16 DIAGNOSIS — I359 Nonrheumatic aortic valve disorder, unspecified: Secondary | ICD-10-CM

## 2011-12-16 DIAGNOSIS — I499 Cardiac arrhythmia, unspecified: Secondary | ICD-10-CM | POA: Diagnosis not present

## 2011-12-16 DIAGNOSIS — F3289 Other specified depressive episodes: Secondary | ICD-10-CM | POA: Diagnosis present

## 2011-12-16 DIAGNOSIS — Z7901 Long term (current) use of anticoagulants: Secondary | ICD-10-CM

## 2011-12-16 DIAGNOSIS — Z8679 Personal history of other diseases of the circulatory system: Secondary | ICD-10-CM

## 2011-12-16 DIAGNOSIS — F329 Major depressive disorder, single episode, unspecified: Secondary | ICD-10-CM | POA: Diagnosis present

## 2011-12-16 DIAGNOSIS — I4891 Unspecified atrial fibrillation: Principal | ICD-10-CM | POA: Diagnosis not present

## 2011-12-16 DIAGNOSIS — D6859 Other primary thrombophilia: Secondary | ICD-10-CM | POA: Diagnosis present

## 2011-12-16 DIAGNOSIS — Q249 Congenital malformation of heart, unspecified: Secondary | ICD-10-CM | POA: Insufficient documentation

## 2011-12-16 LAB — BASIC METABOLIC PANEL
BUN: 21 mg/dL (ref 6–23)
Calcium: 9.8 mg/dL (ref 8.4–10.5)
GFR calc Af Amer: >90 mL/min (ref >90)
GFR calc non Af Amer: >90 mL/min (ref >90)
Glucose, Bld: 90 mg/dL (ref 70–99)
Potassium: 3.5 mEq/L (ref 3.5–5.1)
Sodium: 136 mEq/L (ref 135–145)

## 2011-12-16 LAB — COMPREHENSIVE METABOLIC PANEL
ALT: 39 U/L — ABNORMAL HIGH (ref 0–35)
AST: 29 U/L (ref 0–37)
Albumin: 3.7 g/dL (ref 3.5–5.2)
Alkaline Phosphatase: 74 U/L (ref 39–117)
Chloride: 97 mEq/L (ref 96–112)
Potassium: 3.6 mEq/L (ref 3.5–5.1)
Sodium: 139 mEq/L (ref 135–145)
Total Bilirubin: 0.6 mg/dL (ref 0.3–1.2)
Total Protein: 7.2 g/dL (ref 6.0–8.3)

## 2011-12-16 LAB — CBC
HCT: 40.2 % (ref 36.0–46.0)
MCHC: 32.6 g/dL (ref 30.0–36.0)
RDW: 15.2 % (ref 11.5–15.5)
WBC: 4.2 10*3/uL (ref 4.0–10.5)

## 2011-12-16 LAB — PROTIME-INR: INR: 1.69 — ABNORMAL HIGH (ref 0.00–1.49)

## 2011-12-16 MED ORDER — DULOXETINE HCL 30 MG PO CPEP
30.0000 mg | ORAL_CAPSULE | Freq: Every day | ORAL | Status: DC
  Administered 2011-12-17 – 2011-12-18 (×2): 30 mg via ORAL
  Filled 2011-12-16 (×3): qty 1

## 2011-12-16 MED ORDER — SPIRONOLACTONE 50 MG PO TABS
50.0000 mg | ORAL_TABLET | Freq: Every day | ORAL | Status: DC
  Administered 2011-12-17 – 2011-12-18 (×2): 50 mg via ORAL
  Filled 2011-12-16 (×3): qty 1

## 2011-12-16 MED ORDER — SODIUM CHLORIDE 0.9 % IJ SOLN: 3.0000 mL | INTRAMUSCULAR | Status: DC | PRN

## 2011-12-16 MED ORDER — SODIUM CHLORIDE 0.9 % IJ SOLN
3.0000 mL | Freq: Two times a day (BID) | INTRAMUSCULAR | Status: DC
  Administered 2011-12-16 – 2011-12-18 (×4): 3 mL via INTRAVENOUS

## 2011-12-16 MED ORDER — WARFARIN SODIUM 4 MG PO TABS
4.0000 mg | ORAL_TABLET | Freq: Every day | ORAL | Status: DC
  Filled 2011-12-16: qty 1

## 2011-12-16 MED ORDER — ACETAMINOPHEN 325 MG PO TABS: 650.0000 mg | ORAL_TABLET | ORAL | Status: DC | PRN

## 2011-12-16 MED ORDER — ZOLPIDEM TARTRATE 5 MG PO TABS: 5.0000 mg | ORAL_TABLET | Freq: Every evening | ORAL | Status: DC | PRN

## 2011-12-16 MED ORDER — HYDROCHLOROTHIAZIDE 50 MG PO TABS
50.0000 mg | ORAL_TABLET | Freq: Every day | ORAL | Status: DC
  Administered 2011-12-17 – 2011-12-18 (×2): 50 mg via ORAL
  Filled 2011-12-16 (×3): qty 1

## 2011-12-16 MED ORDER — POTASSIUM CHLORIDE CRYS ER 20 MEQ PO TBCR
30.0000 meq | EXTENDED_RELEASE_TABLET | Freq: Every day | ORAL | Status: DC
  Administered 2011-12-17 – 2011-12-18 (×2): 30 meq via ORAL
  Filled 2011-12-16 (×3): qty 1

## 2011-12-16 MED ORDER — SOTALOL HCL 80 MG PO TABS
160.0000 mg | ORAL_TABLET | Freq: Two times a day (BID) | ORAL | Status: DC
  Administered 2011-12-16 – 2011-12-18 (×5): 160 mg via ORAL
  Filled 2011-12-16 (×7): qty 2

## 2011-12-16 MED ORDER — ONDANSETRON HCL 4 MG/2ML IJ SOLN: 4.0000 mg | Freq: Four times a day (QID) | INTRAMUSCULAR | Status: DC | PRN

## 2011-12-16 MED ORDER — WARFARIN SODIUM 5 MG PO TABS
5.0000 mg | ORAL_TABLET | Freq: Once | ORAL | Status: AC
  Administered 2011-12-16: 5 mg via ORAL
  Filled 2011-12-16: qty 1

## 2011-12-16 MED ORDER — WARFARIN - PHYSICIAN DOSING INPATIENT: Freq: Every day | Status: DC

## 2011-12-16 MED ORDER — FUROSEMIDE 40 MG PO TABS
40.0000 mg | ORAL_TABLET | Freq: Every day | ORAL | Status: DC
  Administered 2011-12-17 – 2011-12-18 (×2): 40 mg via ORAL
  Filled 2011-12-16 (×3): qty 1

## 2011-12-16 MED ORDER — SODIUM CHLORIDE 0.9 % IV SOLN: 250.0000 mL | INTRAVENOUS | Status: DC | PRN

## 2011-12-16 MED ORDER — NORETHINDRONE-ETH ESTRADIOL 1-35 MG-MCG PO TABS
1.0000 | ORAL_TABLET | Freq: Every day | ORAL | Status: DC
  Administered 2011-12-17 – 2011-12-18 (×2): 1 via ORAL
  Filled 2011-12-16: qty 1

## 2011-12-16 MED ORDER — WARFARIN - PHARMACIST DOSING INPATIENT: Freq: Every day | Status: DC

## 2011-12-16 NOTE — Progress Notes (Signed)
ANTICOAGULATION CONSULT NOTE - Initial Consult  Pharmacy Consult for Coumadin Indication: MVR, AVR  Allergies  Allergen Reactions  . Povidone Other (See Comments)    Like sunburn. Really bad rash. Do not use for cath procedures    Patient Measurements:    Vital Signs:    Labs:  Basename 12/16/11 1016  HGB 13.1  HCT 40.2  PLT 197  APTT --  LABPROT 20.2*  INR 1.69*  HEPARINUNFRC --  CREATININE 0.74  CKTOTAL --  CKMB --  TROPONINI --    The CrCl is unknown because both a height and weight (above a minimum accepted value) are required for this calculation.   Medical History: Past Medical History  Diagnosis Date  . 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 Migraine   . Depression   . Hepatitis C   . Atrioventricular canal (AVC), complete     Has had 7 sternotomies for repair of this  . Acute on chronic heart failure     periprosthetic mitral valve leak  . H/O mitral valve replacement     a. x 3 -  most recent 06/2011 - St. Jude MVR  . History of aortic valve replacement     a. 25mm SJM 1995  . H/O maze procedure     06/2011  . History of tricuspid valve repair     06/2011    Medications:  Prescriptions prior to admission  Medication Sig Dispense Refill  . aspirin 81 MG tablet Take 81 mg by mouth daily.        . DULoxetine (CYMBALTA) 30 MG capsule Take 30 mg by mouth daily.      . furosemide (LASIX) 40 MG tablet Take 40 mg by mouth daily.       . hydrochlorothiazide (HYDRODIURIL) 50 MG tablet Take 50 mg by mouth daily.      . metoprolol succinate (TOPROL-XL) 25 MG 24 hr tablet Take 25 mg by mouth daily.      . norethindrone-ethinyl estradiol 1/35 (ORTHO-NOVUM, NORTREL,CYCLAFEM) tablet Take 1 tablet by mouth daily.  1 Package  5  . potassium chloride (K-DUR) 10 MEQ tablet Take 20 mEq by mouth daily.       Marland Kitchen spironolactone (ALDACTONE) 50 MG tablet TAKE 1 TABLET BY MOUTH DAILY  30 tablet  0  . warfarin (COUMADIN) 4 MG tablet TAKE  1 TABLET BY MOUTH DAILY  30 tablet  0  . zaleplon (SONATA) 10 MG capsule Take 10 mg by mouth at bedtime as needed. For sleep        Assessment:35 yo lady on coumadin for MVR,AVR.  Admission INR subtherapeutic on coumadin 4 mg daily Goal of Therapy:  INR 2.5-3.0 Monitor platelets by anticoagulation protocol: Yes   Plan:  Coumadin 5 mg today. Check daily protimes. Pt will bring in her own Ortho-Novum to use and we will substitute ambien for sonata.   Jessica [name] 12/16/2011,1:46 PM

## 2011-12-16 NOTE — H&P (Signed)
Patient ID: Jessica [name] MRN: [id number], DOB/AGE: 1975-10-31   Admit date: 12/16/2011 Date of Consult: @TODAY @  Primary Physician: Carrie Mew, MD, MD Primary Cardiologist: Dietrich Pates and [facility])   Problem List: Past Medical History  Diagnosis Date  . 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 Migraine   . Depression   . Hepatitis C   . Atrioventricular canal (AVC), complete     Has had 7 sternotomies for repair of this  . Acute on chronic heart failure     periprosthetic mitral valve leak  . H/O mitral valve replacement     a. x 3 -  most recent 06/2011 - St. Jude MVR  . History of aortic valve replacement     a. 25mm SJM 1995  . H/O maze procedure     06/2011  . 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 Aorto ventricular canal defect repair     age 60 mths  . Mitral valve replacement 06/2011    Third mitral valve replacement  . Aortic valve replacement   . Sternotomy     She has had 7 sternotomy  . Cardioversion 08/17/2011    Procedure: CARDIOVERSION;  Surgeon: Luis Abed, MD;  Location: [facility] OR;  Service: Cardiovascular;  Laterality: N/A;  . Cardioversion 09/11/2011    Procedure: CARDIOVERSION;  Surgeon: Pricilla Riffle, MD;  Location: [facility] OR;  Service: Cardiovascular;  Laterality: N/A;  Carioversion  . Cardioversion 09/29/2011    Procedure: CARDIOVERSION;  Surgeon: Vesta Mixer, MD;  Location: [facility] OR;  Service: Cardiovascular;  Laterality: N/A;     Allergies:  Allergies  Allergen Reactions  . Povidone Other (See Comments)    Like sunburn. Really bad rash. Do not use for cath procedures    HPI:  Patient is a 36 year old with a history of AV canal defect s/p repair in early life. In May 1991 she underwent modified [place] operation with a 25 mm St Jude valve in the aortic position. In 1995 she had a 23 mm Carbomedics valve placed in the mitral position for a partially obstructed  valve. 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 All surgeries were done at [facility]. In December 2012 she underwent MV replacement (3rd) with St Jude prosthesis. She also had TV reconstruction, MAZE procedure, isthmus ablation and reconstruction of her tricuspid valve.. Also had repair of ascending aortic tear. . She also underwent ablation for atrial flutter post op.  This was done at [facility] (Dr Judi Cong) The patieht did well initially   About 1 month ago though she began experiencing episodes of palpitations.  Associated with SOB.  Last 20 min to 1 hour.  She wore 2 holter monitors.  These showed PVCs (multifocal), 1 NSVT, short lived; and possible atrial tachycardia.  Discussed with Dr. Judi Cong.  Given symptoms and patients atriopathy, recomm a trial of sotalol to see if this can suppress. SInce I saw her in clnic she continues to have intermittent palpitations.   When not going fast, she feels good.  Breathing is OK  No dizziness.    Inpatient Medications:    . DULoxetine  30 mg Oral Daily  . furosemide  40 mg Oral Daily  . hydrochlorothiazide  50 mg Oral Daily  . potassium chloride  30 mEq Oral Daily  . sodium chloride  3 mL Intravenous Q12H  .  sotalol  160 mg Oral Q12H  . spironolactone  50 mg Oral Daily  . warfarin  4 mg Oral q1800  . Warfarin - Physician Dosing Inpatient   Does not apply q1800    Family History  Problem Relation Age of Onset  . Colon cancer    . Colon polyps    . Hyperlipidemia Mother   . Depression Mother   . Hyperlipidemia Father   . Hypertension Father      History   Social History  . Marital Status: Single    Spouse Name: N/A    Number of Children: N/A  . Years of Education: N/A   Occupational History  . legal specialist    Social History Main Topics  . Smoking status: Never Smoker   . Smokeless tobacco: Never Used  . Alcohol Use: Yes     occasionally  . Drug Use: No  . Sexually Active: Yes   Other Topics Concern  . Not on file    Social History Narrative  . No narrative on file     Review of Systems: General: negative for chills, fever, night sweats or weight changes.  Cardiovascular: negative for chest pain, dyspnea on exertion, edema, orthopnea, palpitations, paroxysmal nocturnal dyspnea or shortness of breath Dermatological: negative for rash Respiratory: negative for cough or wheezing Urologic: negative for hematuria Abdominal: negative for nausea, vomiting, diarrhea, bright red blood per rectum, melena, or hematemesis Neurologic: negative for visual changes, syncope, or dizziness All other systems reviewed and are otherwise negative except as noted above.  Physical Exam: BP 120/70   P 80  RR 16.  General: Well developed, well nourished, in no acute distress. Head: Normocephalic, atraumatic, sclera non-icteric Neck: Negative for carotid bruits. JVP not elevated. Lungs: Clear bilaterally to auscultation without wheezes, rales, or rhonchi. Breathing is unlabored. Heart: RRR with S1 S2.Gr I-II/VI systolic murmur.  Crisp valve sounds. Abdomen: Soft, non-tender, non-distended with normoactive bowel sounds. No hepatomegaly. No rebound/guarding. No obvious abdominal masses. Msk:  Strength and tone appears normal for age. Extremities: No clubbing, cyanosis or edema.  Distal pedal pulses are 2+ and equal bilaterally. Neuro: Alert and oriented X 3. Moves all extremities spontaneously. Psych:  Responds to questions appropriately with a normal affect.  Labs: Results for orders placed during the hospital encounter of 12/16/11 (from the past 24 hour(s))  CBC     Status: Normal   Collection Time   12/16/11 10:16 AM      Component Value Range   WBC 4.2  4.0 - 10.5 (K/uL)   RBC 4.42  3.87 - 5.11 (MIL/uL)   Hemoglobin 13.1  12.0 - 15.0 (g/dL)   HCT 16.1  09.6 - 04.5 (%)   MCV 91.0  78.0 - 100.0 (fL)   MCH 29.6  26.0 - 34.0 (pg)   MCHC 32.6  30.0 - 36.0 (g/dL)   RDW 40.9  81.1 - 91.4 (%)   Platelets 197  150 -  400 (K/uL)  COMPREHENSIVE METABOLIC PANEL     Status: Abnormal   Collection Time   12/16/11 10:16 AM      Component Value Range   Sodium 139  135 - 145 (mEq/L)   Potassium 3.6  3.5 - 5.1 (mEq/L)   Chloride 97  96 - 112 (mEq/L)   CO2 32  19 - 32 (mEq/L)   Glucose, Bld 109 (*) 70 - 99 (mg/dL)   BUN 19  6 - 23 (mg/dL)   Creatinine, Ser 7.82  0.50 - 1.10 (  mg/dL)   Calcium 9.9  8.4 - 40.1 (mg/dL)   Total Protein 7.2  6.0 - 8.3 (g/dL)   Albumin 3.7  3.5 - 5.2 (g/dL)   AST 29  0 - 37 (U/L)   ALT 39 (*) 0 - 35 (U/L)   Alkaline Phosphatase 74  39 - 117 (U/L)   Total Bilirubin 0.6  0.3 - 1.2 (mg/dL)   GFR calc non Af Amer >90  >90 (mL/min)   GFR calc Af Amer >90  >90 (mL/min)  PROTIME-INR     Status: Abnormal   Collection Time   12/16/11 10:16 AM      Component Value Range   Prothrombin Time 20.2 (*) 11.6 - 15.2 (seconds)   INR 1.69 (*) 0.00 - 1.49     Radiology/Studies: No results found.  EKG:SR/  RBBB/  QTc 516 msec.  ASSESSMENT AND PLAN:  1  Arrhythmia.  Plan to admit  Toprol was d/c'd  Will begin sotalol.  Follow QTc,  Continue as long as less that 550.   2.  S/P MVR.  INR was subtherapeutic today.  Will ask pharmacy to follow while here.    Signed, Dietrich Pates 12/16/2011, 12:13 PM

## 2011-12-17 ENCOUNTER — Encounter (HOSPITAL_COMMUNITY): Payer: Self-pay | Admitting: Family Medicine

## 2011-12-17 LAB — BASIC METABOLIC PANEL
BUN: 20 mg/dL (ref 6–23)
CO2: 31 mEq/L (ref 19–32)
Calcium: 9.5 mg/dL (ref 8.4–10.5)
Creatinine, Ser: 0.69 mg/dL (ref 0.50–1.10)
GFR calc non Af Amer: >90 mL/min (ref >90)
Glucose, Bld: 89 mg/dL (ref 70–99)
Sodium: 133 mEq/L — ABNORMAL LOW (ref 135–145)

## 2011-12-17 MED ORDER — WARFARIN SODIUM 7.5 MG PO TABS
7.5000 mg | ORAL_TABLET | Freq: Once | ORAL | Status: AC
  Administered 2011-12-17: 7.5 mg via ORAL
  Filled 2011-12-17: qty 1

## 2011-12-17 NOTE — Progress Notes (Signed)
ANTICOAGULATION CONSULT NOTE - Initial Consult  Pharmacy Consult for Coumadin Indication: MVR, AVR  Allergies  Allergen Reactions  . Povidone Other (See Comments)    Like sunburn. Really bad rash. Do not use for cath procedures    Patient Measurements: Height: 5\' 4"  (162.6 cm) Weight: 132 lb 12.8 oz (60.238 kg) IBW/kg (Calculated) : 54.7   Vital Signs: Temp: 98.7 F (37.1 C) (05/26 0546) Temp src: Oral (05/26 0546) BP: 103/64 mmHg (05/26 0546) Pulse Rate: 71  (05/26 0546)  Labs:  Basename 12/17/11 0021 12/16/11 1306 12/16/11 1016  HGB -- -- 13.1  HCT -- -- 40.2  PLT -- -- 197  APTT -- -- --  LABPROT 21.0* -- 20.2*  INR 1.78* -- 1.69*  HEPARINUNFRC -- -- --  CREATININE 0.69 0.65 0.74  CKTOTAL -- -- --  CKMB -- -- --  TROPONINI -- -- --    Estimated Creatinine Clearance: 85.6 ml/min (by C-G formula based on Cr of 0.69).   Medical History: Past Medical History  Diagnosis Date  . 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 Migraine   . Depression   . Hepatitis C   . Atrioventricular canal (AVC), complete     Has had 7 sternotomies for repair of this  . Acute on chronic heart failure     periprosthetic mitral valve leak  . H/O mitral valve replacement     a. x 3 -  most recent 06/2011 - St. Jude MVR  . History of aortic valve replacement     a. 25mm SJM 1995  . H/O maze procedure     06/2011  . History of tricuspid valve repair     06/2011    Medications:  Prescriptions prior to admission  Medication Sig Dispense Refill  . aspirin 81 MG tablet Take 81 mg by mouth daily.        . DULoxetine (CYMBALTA) 30 MG capsule Take 30 mg by mouth daily.      . furosemide (LASIX) 40 MG tablet Take 40 mg by mouth daily.       . hydrochlorothiazide (HYDRODIURIL) 50 MG tablet Take 50 mg by mouth daily.      . metoprolol succinate (TOPROL-XL) 25 MG 24 hr tablet Take 25 mg by mouth daily.      . norethindrone-ethinyl estradiol 1/35  (ORTHO-NOVUM, NORTREL,CYCLAFEM) tablet Take 1 tablet by mouth daily.  1 Package  5  . potassium chloride (K-DUR) 10 MEQ tablet Take 20 mEq by mouth daily.       Marland Kitchen spironolactone (ALDACTONE) 50 MG tablet TAKE 1 TABLET BY MOUTH DAILY  30 tablet  0  . warfarin (COUMADIN) 4 MG tablet TAKE 1 TABLET BY MOUTH DAILY  30 tablet  0  . zaleplon (SONATA) 10 MG capsule Take 10 mg by mouth at bedtime as needed. For sleep        Assessment:35 yo lady on coumadin for MVR,AVR.  Admission INR subtherapeutic on coumadin 4 mg daily.  INR today is 1.78 Goal of Therapy:  INR 2.5-3.5 Monitor platelets by anticoagulation protocol: Yes   Plan:  Coumadin 7.5 mg today. Check daily protimes.   Talbert Cage Poteet 12/17/2011,12:29 PM

## 2011-12-17 NOTE — Progress Notes (Signed)
Patient Name: Jessica [name] Date of Encounter: 12/17/2011   Principal Problem:  *Arrhythmia Active Problems:  Atrial fibrillation  Primary hypercoagulable state  SUBJECTIVE: No problems or concerns  OBJECTIVE Filed Vitals:   12/16/11 1506 12/16/11 2110 12/17/11 0546 12/17/11 0829  BP: 97/68 112/73 103/64   Pulse: 71 75 71   Temp: 98.3 F (36.8 C) 98.6 F (37 C) 98.7 F (37.1 C)   TempSrc: Oral Oral Oral   Resp: 16 18 19    Height:    5\' 4"  (1.626 m)  Weight:    132 lb 12.8 oz (60.238 kg)  SpO2: 98% 100% 97%     Intake/Output Summary (Last 24 hours) at 12/17/11 0911 Last data filed at 12/16/11 1755  Gross per 24 hour  Intake    600 ml  Output      0 ml  Net    600 ml   Weight change:  Filed Weights   12/17/11 0829  Weight: 132 lb 12.8 oz (60.238 kg)     PHYSICAL EXAM General: Well developed, well nourished, female in no acute distress. Head: Normocephalic, atraumatic.  Neck: Supple without bruits, JVD not elevated. Lungs:  Resp regular and unlabored, CTA bilaterally. Heart: RRR, S1, S2, no S3, S4, crisp valve click with systolic and diastolic murmurs. Abdomen: Soft, non-tender, non-distended, BS + x 4.  Extremities: No clubbing, cyanosis, no edema.  Neuro: Alert and oriented X 3. Moves all extremities spontaneously. Psych: Normal affect.  LABS: CBC: Basename 12/16/11 1016  WBC 4.2  NEUTROABS --  HGB 13.1  HCT 40.2  MCV 91.0  PLT 197   INR: Basename 12/17/11 0021  INR 1.78*   Basic Metabolic Panel: Basename 12/17/11 0021 12/16/11 1306  NA 133* 136  K 3.6 3.5  CL 94* 98  CO2 31 27  GLUCOSE 89 90  BUN 20 21  CREATININE 0.69 0.65  CALCIUM 9.5 9.8  MG 1.8 --  PHOS -- --   Liver Function Tests: Basename 12/16/11 1016  AST 29  ALT 39*  ALKPHOS 74  BILITOT 0.6  PROT 7.2  ALBUMIN 3.7    ECG: 17-Dec-2011 04:47:36  Unusual P axis, possible ectopic atrial rhythm - think sinus rhythm with small P waves but tele review shows possible  atrial fib at times, rate controlled.  Right bundle branch block Left anterior fascicular block  Bifascicular block  Vent. rate 69 BPM PR interval 200 ms QRS duration 150 ms QT/QTc 498/533 ms P-R-T axes 261 -60 72  16-Dec-2011 23:23:17 - before sotalol Normal sinus rhythm with sinus arrhythmia Right bundle branch block Left anterior fascicular block  Bifascicular block  Vent. rate 74 BPM PR interval 176 ms QRS duration 150 ms QT/QTc 474/526 ms P-R-T axes * -65 66  Current Medications:    . DULoxetine  30 mg Oral Daily  . furosemide  40 mg Oral Daily  . hydrochlorothiazide  50 mg Oral Daily  . norethindrone-ethinyl estradiol 1/35  1 tablet Oral Daily  . potassium chloride  30 mEq Oral Daily  . sodium chloride  3 mL Intravenous Q12H  . sotalol  160 mg Oral Q12H  . spironolactone  50 mg Oral Daily  . warfarin  5 mg Oral ONCE-1800  . Warfarin - Pharmacist Dosing Inpatient   Does not apply q1800  . DISCONTD: warfarin  4 mg Oral q1800  . DISCONTD: Warfarin - Physician Dosing Inpatient   Does not apply q1800      ASSESSMENT AND PLAN: Principal Problem:  *  Arrhythmia - Pt tolerating sotalol well. No sig change in QTc on Rx. Follow. No significant arrhythmia on telemetry  Will follow  Of interest, patiient says in the week prior to admit she had short bursts of palpitations but no long spells like sh had been having  Her breathing (usually SOB with spells) had been OK.  S/p MVR  Continue coumadin.  Signed, [name] , PA-C  Patient seen.   Agree with findings as noted above by R Barrett. Continue sotalol.  Follow EKG   Discussed with pharmacy re coumadin  Want INR closer to 3 so as to not run low.  9:11 AM 12/17/2011

## 2011-12-18 LAB — BASIC METABOLIC PANEL
BUN: 22 mg/dL (ref 6–23)
CO2: 28 mEq/L (ref 19–32)
Glucose, Bld: 88 mg/dL (ref 70–99)
Potassium: 3.6 mEq/L (ref 3.5–5.1)
Sodium: 137 mEq/L (ref 135–145)

## 2011-12-18 MED ORDER — WARFARIN SODIUM 5 MG PO TABS
5.0000 mg | ORAL_TABLET | Freq: Once | ORAL | Status: DC
  Filled 2011-12-18: qty 1

## 2011-12-18 MED ORDER — SOTALOL HCL 160 MG PO TABS: 160.0000 mg | ORAL_TABLET | Freq: Two times a day (BID) | ORAL | Status: DC

## 2011-12-18 MED ORDER — POTASSIUM CHLORIDE ER 10 MEQ PO TBCR: 30.0000 meq | EXTENDED_RELEASE_TABLET | Freq: Every day | ORAL | Status: DC

## 2011-12-18 NOTE — Discharge Instructions (Signed)
Followup with Dr. [name] and with Coumadin checks as directed.

## 2011-12-18 NOTE — Progress Notes (Signed)
1620 - pt d/c home with instructions and prescriptions.  Pt and family verbalized understanding of instructions, pt home with family, escorted self out per request.  Ninetta Lights RN

## 2011-12-18 NOTE — Progress Notes (Signed)
ANTICOAGULATION CONSULT NOTE - FOLLOW UP  Pharmacy Consult:  Coumadin Indication: MVR, AVR  Allergies  Allergen Reactions  . Povidone Other (See Comments)    Like sunburn. Really bad rash. Do not use for cath procedures    Patient Measurements: Height: 5\' 4"  (162.6 cm) Weight: 132 lb 12.8 oz (60.238 kg) IBW/kg (Calculated) : 54.7   Vital Signs: Temp: 98.4 F (36.9 C) (05/27 0500) Temp src: Oral (05/27 0500) BP: 117/81 mmHg (05/27 1006) Pulse Rate: 70  (05/27 1006)  Labs:  Basename 12/18/11 0530 12/17/11 0021 12/16/11 1306 12/16/11 1016  HGB -- -- -- 13.1  HCT -- -- -- 40.2  PLT -- -- -- 197  APTT -- -- -- --  LABPROT 27.3* 21.0* -- 20.2*  INR 2.49* 1.78* -- 1.69*  HEPARINUNFRC -- -- -- --  CREATININE 0.76 0.69 0.65 --  CKTOTAL -- -- -- --  CKMB -- -- -- --  TROPONINI -- -- -- --    Estimated Creatinine Clearance: 85.6 ml/min (by C-G formula based on Cr of 0.76).   Assessment: 35 YOF on Coumadin for St. Jude MVR and AVR.  Patient admitted with Sotalol initiation and INR upon admission is subtherapeutic on Coumadin 4 mg PO daily.  INR therapeutic today; QTc remains appropriate for Sotalol therapy.   Goal of Therapy:  INR 2.5-3.5 Monitor platelets by anticoagulation protocol: Yes    Plan:  - Coumadin 5mg  PO today - INR in AM if still here     Abdulla Pooley D. Laney Potash, PharmD, BCPS Pager:  [phone] 12/18/2011, 11:38 AM

## 2011-12-18 NOTE — Progress Notes (Signed)
Subjective: No CP  No SOB. Objective: Filed Vitals:   12/17/11 0829 12/17/11 1440 12/17/11 2004 12/18/11 0500  BP:  93/58 117/78 101/67  Pulse:  68 74 69  Temp:  98.6 F (37 C) 99.4 F (37.4 C) 98.4 F (36.9 C)  TempSrc:  Oral Oral Oral  Resp:  18 19 19   Height: 5\' 4"  (1.626 m)     Weight: 132 lb 12.8 oz (60.238 kg)     SpO2:  97% 98% 97%   Weight change:   Intake/Output Summary (Last 24 hours) at 12/18/11 0845 Last data filed at 12/17/11 1822  Gross per 24 hour  Intake    240 ml  Output      0 ml  Net    240 ml    General: Alert, awake, oriented x3, in no acute distress Neck:  JVP is normal Heart: Regular rate and rhythm Crisp valve sounds.  Lungs: Clear to auscultation.  No rales or wheezes. Exemities:  No edema.   Neuro: Grossly intact, nonfocal.  Tele:  SR> EKG:  SR.  RBBB.  QTc 527 msec. Lab Results: Results for orders placed during the hospital encounter of 12/16/11 (from the past 24 hour(s))  PROTIME-INR     Status: Abnormal   Collection Time   12/18/11  5:30 AM      Component Value Range   Prothrombin Time 27.3 (*) 11.6 - 15.2 (seconds)   INR 2.49 (*) 0.00 - 1.49   BASIC METABOLIC PANEL     Status: Normal   Collection Time   12/18/11  5:30 AM      Component Value Range   Sodium 137  135 - 145 (mEq/L)   Potassium 3.6  3.5 - 5.1 (mEq/L)   Chloride 99  96 - 112 (mEq/L)   CO2 28  19 - 32 (mEq/L)   Glucose, Bld 88  70 - 99 (mg/dL)   BUN 22  6 - 23 (mg/dL)   Creatinine, Ser 1.61  0.50 - 1.10 (mg/dL)   Calcium 9.3  8.4 - 09.6 (mg/dL)   GFR calc non Af Amer >90  >90 (mL/min)   GFR calc Af Amer >90  >90 (mL/min)    Studies/Results: No results found.  Medications: I have reviewed the patient's current medications.   Patient Active Hospital Problem List: Arrhythmia (12/08/2010)   Telemetry with SR.  Continuing to load with Sotalol  QTc has not prolonged signif . Plan to D/C later today (around 3 pm) assuming no problems after next dose. Will f/u as  outpt.  WIll contact pt to arrange  S/p MVR, s/p AVR.    Continue coumadin.  Finally INR is therapeutic with extra coumadin given last night.  Pt admits to eating broccoli last wk.    LOS: 2 days   Dietrich Pates 12/18/2011, 8:45 AM

## 2011-12-18 NOTE — Discharge Summary (Signed)
CARDIOLOGY DISCHARGE SUMMARY   Patient ID: Jessica [name] MRN: [id number] DOB/AGE: Mar 14, 1976 35 y.o.  Admit date: 12/16/2011 Discharge date: 12/18/2011  Primary Discharge Diagnosis:  History of atrial fibrillation, sotalol loading Secondary Discharge Diagnosis:  Past Medical History  Diagnosis Date  . 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 Migraine   . Depression   . Hepatitis C   . Atrioventricular canal (AVC), complete     Has had 7 sternotomies for repair of this  . Acute on chronic heart failure     periprosthetic mitral valve leak  . H/O mitral valve replacement     a. x 3 -  most recent 06/2011 - St. Jude MVR  . History of aortic valve replacement     a. 25mm SJM 1995  . H/O maze procedure     06/2011  . History of tricuspid valve repair     06/2011   Hospital Course: Jessica [name] is a 36 year old female with a history of paroxysmal atrial fibrillation and multiple open heart surgeries. It was determined that sotalol was the best medication for her, so she came to the hospital on the day of admission to start a sotalol load.  Her Toprol-XL was discontinued on admission. Her ECG was monitored carefully during her stay and she had no significant increase in her QTC. Her heart rate stayed within normal limits and she tolerated the medication without significant side effects. On 12/18/2011, she was evaluated by Dr. [name] and considered stable for discharge, to follow up as an outpatient. The office has been contacted to arrange a followup appointment with Dr. [name] and the patient is also to get a Coumadin check next week.  Labs:   Lab Results  Component Value Date   WBC 4.2 12/16/2011   HGB 13.1 12/16/2011   HCT 40.2 12/16/2011   MCV 91.0 12/16/2011   PLT 197 12/16/2011    Lab 12/18/11 0530 12/16/11 1016  NA 137 --  K 3.6 --  CL 99 --  CO2 28 --  BUN 22 --  CREATININE 0.76 --  CALCIUM 9.3 --  PROT -- 7.2  BILITOT -- 0.6  ALKPHOS -- 74    ALT -- 39*  AST -- 29  GLUCOSE 88 --    Basename 12/18/11 0530  INR 2.49*   EKG: 18-Dec-2011 11:19:17  Normal sinus rhythm Right bundle branch block Left anterior fascicular block  Bifascicular block  Minimal voltage criteria for LVH, may be normal variant Vent. rate 65 BPM PR interval 200 ms QRS duration 146 ms QT/QTc 498/517 ms P-R-T axes * -61 76  FOLLOW UP PLANS AND APPOINTMENTS Allergies  Allergen Reactions  . Povidone Other (See Comments)    Like sunburn. Really bad rash. Do not use for cath procedures   Medication List  As of 12/18/2011  3:41 PM   TAKE these medications         aspirin 81 MG tablet   Take 81 mg by mouth daily.      DULoxetine 30 MG capsule   Commonly known as: CYMBALTA   Take 30 mg by mouth daily.      furosemide 40 MG tablet   Commonly known as: LASIX   Take 40 mg by mouth daily.      hydrochlorothiazide 50 MG tablet   Commonly known as: HYDRODIURIL   Take 50 mg by mouth daily.      metoprolol succinate 25 MG 24 hr tablet  Commonly known as: TOPROL-XL   Take 25 mg by mouth daily.      norethindrone-ethinyl estradiol 1/35 tablet   Commonly known as: ORTHO-NOVUM, NORTREL,CYCLAFEM   Take 1 tablet by mouth daily.      potassium chloride 10 MEQ tablet   Commonly known as: K-DUR   Take 3 tablets (30 mEq total) by mouth daily.      sotalol 160 MG tablet   Commonly known as: BETAPACE   Take 1 tablet (160 mg total) by mouth every 12 (twelve) hours.      spironolactone 50 MG tablet   Commonly known as: ALDACTONE   TAKE 1 TABLET BY MOUTH DAILY      warfarin 4 MG tablet   Commonly known as: COUMADIN   TAKE 1 TABLET BY MOUTH DAILY      zaleplon 10 MG capsule   Commonly known as: SONATA   Take 10 mg by mouth at bedtime as needed. For sleep            Discharge Orders    Future Appointments: Provider: Department: Dept Phone: Center:   01/04/2012 11:15 AM Stacie Glaze, MD [facility] [phone] [facility]    Coumadin  check and followup with Dr. [name]        02/08/2012 9:15 AM Stacie Glaze, MD [facility] [phone] [facility]     Future Orders Please Complete By Expires   Diet - low sodium heart healthy      Increase activity slowly          BRING ALL MEDICATIONS WITH YOU TO FOLLOW UP APPOINTMENTS  Time spent with patient to include physician time: 33 min Signed: [name] 12/18/2011, 3:41 PM Co-Sign MD

## 2011-12-22 ENCOUNTER — Ambulatory Visit (INDEPENDENT_AMBULATORY_CARE_PROVIDER_SITE_OTHER): Payer: BC Managed Care – PPO | Admitting: Family

## 2011-12-22 DIAGNOSIS — I824Y9 Acute embolism and thrombosis of unspecified deep veins of unspecified proximal lower extremity: Secondary | ICD-10-CM

## 2011-12-22 DIAGNOSIS — I059 Rheumatic mitral valve disease, unspecified: Secondary | ICD-10-CM

## 2011-12-22 DIAGNOSIS — I4891 Unspecified atrial fibrillation: Secondary | ICD-10-CM

## 2011-12-22 DIAGNOSIS — Z954 Presence of other heart-valve replacement: Secondary | ICD-10-CM

## 2011-12-22 DIAGNOSIS — I359 Nonrheumatic aortic valve disorder, unspecified: Secondary | ICD-10-CM

## 2011-12-22 DIAGNOSIS — D6859 Other primary thrombophilia: Secondary | ICD-10-CM

## 2011-12-22 DIAGNOSIS — Q249 Congenital malformation of heart, unspecified: Secondary | ICD-10-CM

## 2011-12-22 DIAGNOSIS — Z9889 Other specified postprocedural states: Secondary | ICD-10-CM

## 2011-12-22 NOTE — Patient Instructions (Signed)
  Latest dosing instructions   Total Sun Mon Tue Wed Thu Fri Sat   28 4 mg 4 mg 4 mg 4 mg 4 mg 4 mg 4 mg    (4 mg1) (4 mg1) (4 mg1) (4 mg1) (4 mg1) (4 mg1) (4 mg1)        

## 2012-01-01 ENCOUNTER — Telehealth: Payer: Self-pay | Admitting: Internal Medicine

## 2012-01-01 NOTE — Telephone Encounter (Signed)
Patient states that  for the last week she has been very tired. She think is the Sotalol that is making her to be so tired. She was started with the highest dose that it can be taken, whish is 160 mg twice a day. Pt is used to take a lower dose. Pt  would like to know if she can lower the dose to 80 mg twice a day.

## 2012-01-01 NOTE — Telephone Encounter (Signed)
New problem:  Question regarding sotalol 160 mg. Can this be reduce. C/O tired, fatigue.

## 2012-01-03 ENCOUNTER — Telehealth: Payer: Self-pay | Admitting: Internal Medicine

## 2012-01-03 NOTE — Telephone Encounter (Signed)
This is a Dr [name] patient and Dr [name] does not know anything about this patient

## 2012-01-03 NOTE — Telephone Encounter (Signed)
Per pt call today she wants this message to go to Dr. Johney [name] per pt she was told to contact Dr. Johney [name] is she could not contact Dr. [name] and she needs a call today/lg

## 2012-01-03 NOTE — Telephone Encounter (Signed)
Called msg/ told her that Dr [name] would prefer that Dr [name] address her meds since case is extensive, if there are further issues please call back, I told her I would send note to Dr Ross/ Nurse, told her Dr [name] is her 01/08/12. Number provided to call back with further questions.

## 2012-01-04 ENCOUNTER — Ambulatory Visit: Payer: BC Managed Care – PPO | Admitting: Internal Medicine

## 2012-01-07 ENCOUNTER — Other Ambulatory Visit: Payer: Self-pay | Admitting: Internal Medicine

## 2012-01-08 NOTE — Telephone Encounter (Signed)
Discussed with [facility] Rec 120 bid. Sotalol  80 twice prob won't be enough.

## 2012-01-09 NOTE — Telephone Encounter (Signed)
Left message for pt to call back  °

## 2012-01-10 ENCOUNTER — Telehealth: Payer: Self-pay | Admitting: Internal Medicine

## 2012-01-10 ENCOUNTER — Other Ambulatory Visit: Payer: Self-pay

## 2012-01-10 MED ORDER — NORETHINDRONE-ETH ESTRADIOL 0.5-35 MG-MCG PO TABS: 1.0000 | ORAL_TABLET | Freq: Every day | ORAL | Status: DC

## 2012-01-10 MED ORDER — SOTALOL HCL 120 MG PO TABS: 120.0000 mg | ORAL_TABLET | Freq: Two times a day (BID) | ORAL | Status: DC

## 2012-01-10 MED ORDER — DULOXETINE HCL 30 MG PO CPEP: 30.0000 mg | ORAL_CAPSULE | Freq: Every day | ORAL | Status: DC

## 2012-01-10 NOTE — Telephone Encounter (Signed)
Pt needs new rx necon(bcp) no placebo call into [facility] [address]

## 2012-01-10 NOTE — Telephone Encounter (Signed)
done

## 2012-01-10 NOTE — Telephone Encounter (Signed)
Prescription for Sotalol 120 mg one tablet by mouth twice a day send to [facility], patient aware.

## 2012-01-10 NOTE — Telephone Encounter (Signed)
F/U on previous:  Any recommendation of  medication that Dr. [name] has to  be called in to [facility].

## 2012-01-12 ENCOUNTER — Telehealth: Payer: Self-pay | Admitting: Internal Medicine

## 2012-01-12 NOTE — Telephone Encounter (Signed)
Pt called and has a question re: norethindrone-ethinyl estradiol (NECON 0.5/35, 28,) 0.5-35 MG-MCG tablet . What is the difference between the .05-35 mg-mcg and the 1.35,that pt was on before?

## 2012-01-12 NOTE — Telephone Encounter (Signed)
Informed patient that the only difference I can tell in the two dosages is the progestin decrease from 1.0-0.5; patient states that she takes medication in the mornings and was questioning what to do Monday morning [she will have to start new Rx]; instructed to take the norethindrone-ethinyl estradiol 0.5 mg-35 mcg Monday morning; patient is anxious & request call back as to the reason of change from previous [norethindrone-ethinyl estradiol 1 mg-35 mcg]. Please advise & inform patient.

## 2012-01-15 ENCOUNTER — Other Ambulatory Visit: Payer: Self-pay | Admitting: *Deleted

## 2012-01-15 MED ORDER — NORETHINDRONE-ETH ESTRADIOL 1-35 MG-MCG PO TABS: 1.0000 | ORAL_TABLET | Freq: Every day | ORAL | Status: DC

## 2012-01-15 NOTE — Telephone Encounter (Signed)
Talked with pt and called in 135 as she requested

## 2012-01-17 ENCOUNTER — Other Ambulatory Visit: Payer: Self-pay | Admitting: Internal Medicine

## 2012-01-17 ENCOUNTER — Ambulatory Visit (INDEPENDENT_AMBULATORY_CARE_PROVIDER_SITE_OTHER): Payer: BC Managed Care – PPO | Admitting: Family

## 2012-01-17 ENCOUNTER — Telehealth: Payer: Self-pay | Admitting: Internal Medicine

## 2012-01-17 DIAGNOSIS — I059 Rheumatic mitral valve disease, unspecified: Secondary | ICD-10-CM

## 2012-01-17 DIAGNOSIS — Q249 Congenital malformation of heart, unspecified: Secondary | ICD-10-CM

## 2012-01-17 DIAGNOSIS — I359 Nonrheumatic aortic valve disorder, unspecified: Secondary | ICD-10-CM

## 2012-01-17 DIAGNOSIS — I824Y9 Acute embolism and thrombosis of unspecified deep veins of unspecified proximal lower extremity: Secondary | ICD-10-CM

## 2012-01-17 DIAGNOSIS — I4891 Unspecified atrial fibrillation: Secondary | ICD-10-CM

## 2012-01-17 DIAGNOSIS — Z954 Presence of other heart-valve replacement: Secondary | ICD-10-CM

## 2012-01-17 DIAGNOSIS — Z9889 Other specified postprocedural states: Secondary | ICD-10-CM

## 2012-01-17 DIAGNOSIS — D6859 Other primary thrombophilia: Secondary | ICD-10-CM

## 2012-01-17 LAB — BRAIN NATRIURETIC PEPTIDE: Pro B Natriuretic peptide (BNP): 64 pg/mL (ref 0.0–100.0)

## 2012-01-17 NOTE — Patient Instructions (Addendum)
Take additional 2 mg today. Continue 4 mg daily. Recheck in 4 weeks.    Latest dosing instructions   Total Sun Mon Tue Wed Thu Fri Sat   28 4 mg 4 mg 4 mg 4 mg 4 mg 4 mg 4 mg    (4 mg1) (4 mg1) (4 mg1) (4 mg1) (4 mg1) (4 mg1) (4 mg1)

## 2012-01-17 NOTE — Telephone Encounter (Signed)
Done

## 2012-01-17 NOTE — Telephone Encounter (Signed)
Pt needs all lab results from when she was I hospital in May sent to Dr. Manfred Arch please call her and let her know what send

## 2012-01-17 NOTE — Telephone Encounter (Signed)
Labs sent to Dr Inis Sizer at [phone]

## 2012-01-26 ENCOUNTER — Other Ambulatory Visit: Payer: Self-pay | Admitting: Internal Medicine

## 2012-01-31 ENCOUNTER — Encounter: Payer: Self-pay | Admitting: Internal Medicine

## 2012-01-31 ENCOUNTER — Ambulatory Visit (INDEPENDENT_AMBULATORY_CARE_PROVIDER_SITE_OTHER): Payer: BC Managed Care – PPO | Admitting: Internal Medicine

## 2012-01-31 VITALS — BP 116/70 | HR 72 | Temp 98.6°F | Resp 16 | Ht 65.0 in | Wt 129.0 lb

## 2012-01-31 DIAGNOSIS — I10 Essential (primary) hypertension: Secondary | ICD-10-CM

## 2012-01-31 DIAGNOSIS — R609 Edema, unspecified: Secondary | ICD-10-CM

## 2012-01-31 DIAGNOSIS — I509 Heart failure, unspecified: Secondary | ICD-10-CM

## 2012-01-31 NOTE — Patient Instructions (Signed)
Do not stop the hydrochlorothiazide with spironolactone until you have completely titrated off the Lasix.  It is reasonable to go to every other day on the potassium when you cut the Lasix in half and stop the potassium we are able to stop the Lasix the way I would approach stopping the Lasix would be to cut the dose in half for at least 2 weeks before I considered to going every other day with a half dose.  If your weight and edema are stable with you could then discontinue the Lasix, this is a one-month process!   Once you have wean yourself from the Lasix the hydrochlorothiazide in the spironolactone can be weaned at the same time BUT I am not convinced that he did not need those 2 drugs for blood pressure control so I would wait approximately one month after you have stopped the Lasix to see but the stability of your blood pressure is on the 2 remaining diuretics. I note this is a long time... But do it

## 2012-02-01 NOTE — Progress Notes (Signed)
Subjective:    Patient ID: Jessica [name], female    DOB: [birth year], 36 y.o.   MRN: [id number]  HPI Patient is a 36 year old female who has congenital heart failure with multiple surgeries to correct valvular disease.  Recently she had her adult ocular surgery and has been doing exceptionally well since then she has been on diuretic therapy for some time now and is currently on hydrochlorothiazide spur lactone and Lasix.  Recently she attempted to discontinue Lasix and developed a extremity edema. We discussed the strategy for discontinuation of Lasix and gradual titration off the diuretic therapy however we did mention to the patient that due to her congenital heart disease she may have some renal dysfunction and may require diuretic long term.     Review of Systems  Constitutional: Negative for activity change, appetite change and fatigue.  HENT: Negative for ear pain, congestion, neck pain, postnasal drip and sinus pressure.   Eyes: Negative for redness and visual disturbance.  Respiratory: Negative for cough, shortness of breath and wheezing.   Gastrointestinal: Negative for abdominal pain and abdominal distention.  Genitourinary: Negative for dysuria, frequency and menstrual problem.  Musculoskeletal: Negative for myalgias, joint swelling and arthralgias.  Skin: Negative for rash and wound.  Neurological: Negative for dizziness, weakness and headaches.  Hematological: Negative for adenopathy. Does not bruise/bleed easily.  Psychiatric/Behavioral: Negative for disturbed wake/sleep cycle and decreased concentration.   Past Medical History  Diagnosis Date  . 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 Migraine   . Depression   . Hepatitis C   . Atrioventricular canal (AVC), complete     Has had 7 sternotomies for repair of this  . Acute on chronic heart failure     periprosthetic mitral valve leak  . H/O mitral valve replacement     a. x 3 -  most  recent 06/2011 - [employer] Jude MVR  . History of aortic valve replacement     a. 25mm SJM 1995  . H/O maze procedure     06/2011  . History of tricuspid valve repair     06/2011    History   Social History  . Marital Status: Single    Spouse Name: N/A    Number of Children: N/A  . Years of Education: N/A   Occupational History  . legal specialist    Social History Main Topics  . Smoking status: Never Smoker   . Smokeless tobacco: Never Used  . Alcohol Use: Yes     occasionally  . Drug Use: No  . Sexually Active: Yes   Other Topics Concern  . Not on file   Social History Narrative  . No narrative on file    Past Surgical History  Procedure Date  . Aorto ventricular canal defect repair     age 40 mths  . Mitral valve replacement 06/2011    Third mitral valve replacement  . Aortic valve replacement   . Sternotomy     She has had 7 sternotomy  . Cardioversion 08/17/2011    Procedure: CARDIOVERSION;  Surgeon: Luis Abed, MD;  Location: [facility] OR;  Service: Cardiovascular;  Laterality: N/A;  . Cardioversion 09/11/2011    Procedure: CARDIOVERSION;  Surgeon: Pricilla Riffle, MD;  Location: [facility] OR;  Service: Cardiovascular;  Laterality: N/A;  Carioversion  . Cardioversion 09/29/2011    Procedure: CARDIOVERSION;  Surgeon: Vesta Mixer, MD;  Location: [facility] OR;  Service: Cardiovascular;  Laterality: N/A;  Family History  Problem Relation Age of Onset  . Colon cancer    . Colon polyps    . Hyperlipidemia Mother   . Depression Mother   . Hyperlipidemia Father   . Hypertension Father     Allergies  Allergen Reactions  . Povidone Other (See Comments)    Like sunburn. Really bad rash. Do not use for cath procedures    Current Outpatient Prescriptions on File Prior to Visit  Medication Sig Dispense Refill  . aspirin 81 MG tablet Take 81 mg by mouth daily.        . DULoxetine (CYMBALTA) 30 MG capsule Take 1 capsule (30 mg total) by mouth daily.  30 capsule  3  . furosemide  (LASIX) 40 MG tablet Take 40 mg by mouth daily.       . hydrochlorothiazide (HYDRODIURIL) 50 MG tablet TAKE 1 TABLET BY MOUTH EVERY DAY  30 tablet  0  . norethindrone-ethinyl estradiol 1/35 (ORTHO-NOVUM, NORTREL,CYCLAFEM) tablet Take 1 tablet by mouth daily.  1 Package  11  . potassium chloride (K-DUR) 10 MEQ tablet Take 3 tablets (30 mEq total) by mouth daily.  90 tablet  11  . spironolactone (ALDACTONE) 50 MG tablet TAKE 1 TABLET BY MOUTH DAILY  30 tablet  0  . warfarin (COUMADIN) 4 MG tablet TAKE 1 TABLET BY MOUTH DAILY  30 tablet  0  . zaleplon (SONATA) 10 MG capsule Take 10 mg by mouth at bedtime as needed. For sleep        BP 116/70  Pulse 72  Temp 98.6 F (37 C)  Resp 16  Ht 5\' 5"  (1.651 m)  Wt 129 lb (58.514 kg)  BMI 21.47 kg/m2        Objective:   Physical Exam  Nursing note and vitals reviewed. Constitutional: She is oriented to person, place, and time. She appears well-developed and well-nourished. No distress.  HENT:  Head: Normocephalic and atraumatic.  Right Ear: External ear normal.  Left Ear: External ear normal.  Nose: Nose normal.  Mouth/Throat: Oropharynx is clear and moist.  Eyes: Conjunctivae and EOM are normal. Pupils are equal, round, and reactive to light.  Neck: Normal range of motion. Neck supple. No JVD present. No tracheal deviation present. No thyromegaly present.  Cardiovascular: Normal rate, regular rhythm, normal heart sounds and intact distal pulses.   No murmur heard. Pulmonary/Chest: Effort normal and breath sounds normal. She has no wheezes. She exhibits no tenderness.  Abdominal: Soft. Bowel sounds are normal.  Musculoskeletal: Normal range of motion. She exhibits no edema and no tenderness.  Lymphadenopathy:    She has no cervical adenopathy.  Neurological: She is alert and oriented to person, place, and time. She has normal reflexes. No cranial nerve deficit.  Skin: Skin is warm and dry. She is not diaphoretic.  Psychiatric: She has a  normal mood and affect. Her behavior is normal.          Assessment & Plan:  Patient consumed heart disease status post valve replacement and is now in a more stable environment.  We believe that it might be a an opportunity to titrate off some of the diuretic therapy however due to her long-standing heart disease there may be some renal dysfunction that diuretic therapy may be essential.  A very slow titration off diuretics is recommended with monitoring of potassium and renal function we discussed that strategy over the next several months and gave her a plan for a very slow titration with monitoring  of weight.  I have spent more than 30 minutes examining this patient face-to-face of which over half was spent in counseling

## 2012-02-03 ENCOUNTER — Other Ambulatory Visit: Payer: Self-pay | Admitting: Internal Medicine

## 2012-02-08 ENCOUNTER — Ambulatory Visit: Payer: BC Managed Care – PPO | Admitting: Internal Medicine

## 2012-02-14 ENCOUNTER — Ambulatory Visit (INDEPENDENT_AMBULATORY_CARE_PROVIDER_SITE_OTHER): Payer: BC Managed Care – PPO | Admitting: Family

## 2012-02-14 DIAGNOSIS — I359 Nonrheumatic aortic valve disorder, unspecified: Secondary | ICD-10-CM

## 2012-02-14 DIAGNOSIS — I4891 Unspecified atrial fibrillation: Secondary | ICD-10-CM

## 2012-02-14 DIAGNOSIS — I824Y9 Acute embolism and thrombosis of unspecified deep veins of unspecified proximal lower extremity: Secondary | ICD-10-CM

## 2012-02-14 DIAGNOSIS — I059 Rheumatic mitral valve disease, unspecified: Secondary | ICD-10-CM

## 2012-02-14 DIAGNOSIS — D6859 Other primary thrombophilia: Secondary | ICD-10-CM

## 2012-02-14 DIAGNOSIS — Z954 Presence of other heart-valve replacement: Secondary | ICD-10-CM

## 2012-02-14 NOTE — Patient Instructions (Addendum)
Take additional tablet. Continue 4 mg daily. Recheck in 2 weeks.    Latest dosing instructions   Total Sun Mon Tue Wed Thu Fri Sat   28 4 mg 4 mg 4 mg 4 mg 4 mg 4 mg 4 mg    (4 mg1) (4 mg1) (4 mg1) (4 mg1) (4 mg1) (4 mg1) (4 mg1)

## 2012-02-24 ENCOUNTER — Other Ambulatory Visit: Payer: Self-pay | Admitting: Internal Medicine

## 2012-02-27 ENCOUNTER — Ambulatory Visit (INDEPENDENT_AMBULATORY_CARE_PROVIDER_SITE_OTHER): Payer: BC Managed Care – PPO | Admitting: Family

## 2012-02-27 DIAGNOSIS — I4891 Unspecified atrial fibrillation: Secondary | ICD-10-CM

## 2012-02-27 DIAGNOSIS — I824Y9 Acute embolism and thrombosis of unspecified deep veins of unspecified proximal lower extremity: Secondary | ICD-10-CM

## 2012-02-27 DIAGNOSIS — Z954 Presence of other heart-valve replacement: Secondary | ICD-10-CM

## 2012-02-27 DIAGNOSIS — I359 Nonrheumatic aortic valve disorder, unspecified: Secondary | ICD-10-CM

## 2012-02-27 DIAGNOSIS — D6859 Other primary thrombophilia: Secondary | ICD-10-CM

## 2012-02-27 DIAGNOSIS — I059 Rheumatic mitral valve disease, unspecified: Secondary | ICD-10-CM

## 2012-02-27 LAB — POCT INR: INR: 2.5

## 2012-02-27 NOTE — Patient Instructions (Addendum)
  Latest dosing instructions   Total Sun Mon Tue Wed Thu Fri Sat   28 4 mg 4 mg 4 mg 4 mg 4 mg 4 mg 4 mg    (4 mg1) (4 mg1) (4 mg1) (4 mg1) (4 mg1) (4 mg1) (4 mg1)       Continue 4 mg daily. Recheck in 4 weeks.

## 2012-03-26 ENCOUNTER — Other Ambulatory Visit: Payer: Self-pay | Admitting: Internal Medicine

## 2012-03-26 ENCOUNTER — Ambulatory Visit (INDEPENDENT_AMBULATORY_CARE_PROVIDER_SITE_OTHER): Payer: BC Managed Care – PPO | Admitting: Family

## 2012-03-26 DIAGNOSIS — I059 Rheumatic mitral valve disease, unspecified: Secondary | ICD-10-CM

## 2012-03-26 DIAGNOSIS — I359 Nonrheumatic aortic valve disorder, unspecified: Secondary | ICD-10-CM

## 2012-03-26 DIAGNOSIS — I824Y9 Acute embolism and thrombosis of unspecified deep veins of unspecified proximal lower extremity: Secondary | ICD-10-CM

## 2012-03-26 DIAGNOSIS — I4891 Unspecified atrial fibrillation: Secondary | ICD-10-CM

## 2012-03-26 DIAGNOSIS — D6859 Other primary thrombophilia: Secondary | ICD-10-CM

## 2012-03-26 DIAGNOSIS — Z954 Presence of other heart-valve replacement: Secondary | ICD-10-CM

## 2012-03-26 NOTE — Patient Instructions (Signed)
Today only, take an extra half tablet. Then continue 4 mg daily. Recheck in 4 weeks.    Latest dosing instructions   Total Sun Mon Tue Wed Thu Fri Sat   28 4 mg 4 mg 4 mg 4 mg 4 mg 4 mg 4 mg    (4 mg1) (4 mg1) (4 mg1) (4 mg1) (4 mg1) (4 mg1) (4 mg1)

## 2012-04-09 ENCOUNTER — Telehealth: Payer: Self-pay | Admitting: Internal Medicine

## 2012-04-09 ENCOUNTER — Encounter: Payer: Self-pay | Admitting: Family

## 2012-04-09 ENCOUNTER — Ambulatory Visit (INDEPENDENT_AMBULATORY_CARE_PROVIDER_SITE_OTHER): Payer: BC Managed Care – PPO | Admitting: Family

## 2012-04-09 ENCOUNTER — Other Ambulatory Visit: Payer: Self-pay | Admitting: Family

## 2012-04-09 VITALS — BP 110/60 | HR 73 | Temp 98.7°F | Wt 135.0 lb

## 2012-04-09 DIAGNOSIS — Z954 Presence of other heart-valve replacement: Secondary | ICD-10-CM

## 2012-04-09 DIAGNOSIS — I359 Nonrheumatic aortic valve disorder, unspecified: Secondary | ICD-10-CM

## 2012-04-09 DIAGNOSIS — I059 Rheumatic mitral valve disease, unspecified: Secondary | ICD-10-CM

## 2012-04-09 DIAGNOSIS — Z9889 Other specified postprocedural states: Secondary | ICD-10-CM

## 2012-04-09 DIAGNOSIS — I4891 Unspecified atrial fibrillation: Secondary | ICD-10-CM

## 2012-04-09 DIAGNOSIS — Q249 Congenital malformation of heart, unspecified: Secondary | ICD-10-CM

## 2012-04-09 DIAGNOSIS — R5383 Other fatigue: Secondary | ICD-10-CM

## 2012-04-09 DIAGNOSIS — I824Y9 Acute embolism and thrombosis of unspecified deep veins of unspecified proximal lower extremity: Secondary | ICD-10-CM

## 2012-04-09 DIAGNOSIS — R5381 Other malaise: Secondary | ICD-10-CM

## 2012-04-09 DIAGNOSIS — D6859 Other primary thrombophilia: Secondary | ICD-10-CM

## 2012-04-09 DIAGNOSIS — I509 Heart failure, unspecified: Secondary | ICD-10-CM

## 2012-04-09 LAB — BRAIN NATRIURETIC PEPTIDE: Pro B Natriuretic peptide (BNP): 66 pg/mL (ref 0.0–100.0)

## 2012-04-09 LAB — CBC WITH DIFFERENTIAL/PLATELET
Basophils Relative: 0.4 % (ref 0.0–3.0)
Eosinophils Absolute: 0.2 10*3/uL (ref 0.0–0.7)
Eosinophils Relative: 2.3 % (ref 0.0–5.0)
Lymphocytes Relative: 16.6 % (ref 12.0–46.0)
Monocytes Relative: 6.2 % (ref 3.0–12.0)
Neutrophils Relative %: 74.5 % (ref 43.0–77.0)
RBC: 5.01 Mil/uL (ref 3.87–5.11)
WBC: 6.6 10*3/uL (ref 4.5–10.5)

## 2012-04-09 LAB — COMPREHENSIVE METABOLIC PANEL
Albumin: 4.5 g/dL (ref 3.5–5.2)
Alkaline Phosphatase: 70 U/L (ref 39–117)
Glucose, Bld: 68 mg/dL — ABNORMAL LOW (ref 70–99)
Potassium: 3.3 mEq/L — ABNORMAL LOW (ref 3.5–5.1)
Sodium: 139 mEq/L (ref 135–145)
Total Protein: 8.4 g/dL — ABNORMAL HIGH (ref 6.0–8.3)

## 2012-04-09 NOTE — Telephone Encounter (Signed)
Caller: [name]/Patient; Patient Name: Jessica [name]; PCP: Darryll Capers (Adults only); Best Callback Phone Number: [phone]; Reason for call: fatigue, onset 03/24/12, no chest pain, no SOB, wants blood work done today Fatigue Protocol Utilized.  Appt 04/09/12 1045 scheduled with Orvan Falconer.

## 2012-04-09 NOTE — Progress Notes (Signed)
Subjective:    Patient ID: Jessica [name], female    DOB: 1975/10/15, 36 y.o.   MRN: [id number]  HPI 36 year old white female, nonsmoker, patient of Dr. Lovell Sheehan is in with complaints of fatigue x2 weeks. Patient reports resting well at night, uninterrupted. She's requesting to have lab work drawn today. She has a history of congestive heart failure, mechanical valve placement, chronic Coumadin therapy, anxiety and depression. Two-door markedly well and follows up with her cardiologist in [place] biannually. She denies any lightheadedness, dizziness, chest pain, palpitations, shortness of breath or edema.   Review of Systems  Constitutional: Negative.   HENT: Negative.   Eyes: Negative.   Respiratory: Negative.   Cardiovascular: Negative.  Negative for chest pain, palpitations and leg swelling.  Gastrointestinal: Negative.   Musculoskeletal: Negative.   Skin: Negative.   Neurological: Negative.  Negative for dizziness and light-headedness.  Hematological: Negative.   Psychiatric/Behavioral: Negative.    Past Medical History  Diagnosis Date  . 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 Migraine   . Depression   . Hepatitis C   . Atrioventricular canal (AVC), complete     Has had 7 sternotomies for repair of this  . Acute on chronic heart failure     periprosthetic mitral valve leak  . H/O mitral valve replacement     a. x 3 -  most recent 06/2011 - [employer] Jude MVR  . History of aortic valve replacement     a. 25mm SJM 1995  . H/O maze procedure     06/2011  . History of tricuspid valve repair     06/2011    History   Social History  . Marital Status: Single    Spouse Name: N/A    Number of Children: N/A  . Years of Education: N/A   Occupational History  . legal specialist    Social History Main Topics  . Smoking status: Never Smoker   . Smokeless tobacco: Never Used  . Alcohol Use: Yes     occasionally  . Drug Use: No  . Sexually Active:  Yes   Other Topics Concern  . Not on file   Social History Narrative  . No narrative on file    Past Surgical History  Procedure Date  . Aorto ventricular canal defect repair     age 78 mths  . Mitral valve replacement 06/2011    Third mitral valve replacement  . Aortic valve replacement   . Sternotomy     She has had 7 sternotomy  . Cardioversion 08/17/2011    Procedure: CARDIOVERSION;  Surgeon: Luis Abed, MD;  Location: [facility] OR;  Service: Cardiovascular;  Laterality: N/A;  . Cardioversion 09/11/2011    Procedure: CARDIOVERSION;  Surgeon: Pricilla Riffle, MD;  Location: [facility] OR;  Service: Cardiovascular;  Laterality: N/A;  Carioversion  . Cardioversion 09/29/2011    Procedure: CARDIOVERSION;  Surgeon: Vesta Mixer, MD;  Location: [facility] OR;  Service: Cardiovascular;  Laterality: N/A;    Family History  Problem Relation Age of Onset  . Colon cancer    . Colon polyps    . Hyperlipidemia Mother   . Depression Mother   . Hyperlipidemia Father   . Hypertension Father     Allergies  Allergen Reactions  . Povidone Other (See Comments)    Like sunburn. Really bad rash. Do not use for cath procedures    Current Outpatient Prescriptions on File Prior to Visit  Medication  Sig Dispense Refill  . aspirin 81 MG tablet Take 81 mg by mouth daily.        . DULoxetine (CYMBALTA) 30 MG capsule Take 1 capsule (30 mg total) by mouth daily.  30 capsule  3  . furosemide (LASIX) 40 MG tablet Take 40 mg by mouth daily.       . hydrochlorothiazide (HYDRODIURIL) 50 MG tablet TAKE 1 TABLET BY MOUTH EVERY DAY  30 tablet  0  . norethindrone-ethinyl estradiol 1/35 (ORTHO-NOVUM, NORTREL,CYCLAFEM) tablet Take 1 tablet by mouth daily.  1 Package  11  . potassium chloride (K-DUR) 10 MEQ tablet Take 3 tablets (30 mEq total) by mouth daily.  90 tablet  11  . sotalol (BETAPACE) 120 MG tablet Take 80 mg by mouth 2 (two) times daily.      Marland Kitchen spironolactone (ALDACTONE) 50 MG tablet TAKE 1 TABLET BY MOUTH DAILY  30  tablet  0  . warfarin (COUMADIN) 4 MG tablet TAKE 1 TABLET BY MOUTH DAILY  30 tablet  0  . zaleplon (SONATA) 10 MG capsule Take 10 mg by mouth at bedtime as needed. For sleep        BP 110/60  Pulse 73  Temp 98.7 F (37.1 C) (Oral)  Wt 135 lb (61.236 kg)  SpO2 95%chart    Objective:   Physical Exam  Constitutional: She is oriented to person, place, and time. She appears well-developed and well-nourished.  HENT:  Right Ear: External ear normal.  Left Ear: External ear normal.  Nose: Nose normal.  Mouth/Throat: Oropharynx is clear and moist.  Eyes: Conjunctivae normal are normal. Pupils are equal, round, and reactive to light.  Neck: Normal range of motion. Neck supple. No thyromegaly present.  Cardiovascular: Normal rate, regular rhythm and normal heart sounds.   Pulmonary/Chest: Effort normal and breath sounds normal.  Musculoskeletal: Normal range of motion.  Neurological: She is alert and oriented to person, place, and time. She has normal reflexes.  Skin: Skin is warm and dry.  Psychiatric: She has a normal mood and affect.          Assessment & Plan:  Assessment: Fatigue, congestive heart failure  Plan: Lab sent to include CBC, CMP, TSH, and BNP. Will notify patient pending results. Encouraged rest, plenty of fluids. We'll follow up patient of the results of her labs and as needed.

## 2012-04-09 NOTE — Patient Instructions (Signed)
Increase to 6mg  on Tues and Thursdays. Then continue 4 mg daily. Recheck in 4 weeks.    Latest dosing instructions   Total Sun Mon Tue Wed Thu Fri Sat   32 4 mg 4 mg 6 mg 4 mg 6 mg 4 mg 4 mg    (4 mg1) (4 mg1) (4 mg1.5) (4 mg1) (4 mg1.5) (4 mg1) (4 mg1)

## 2012-04-23 ENCOUNTER — Other Ambulatory Visit: Payer: Self-pay | Admitting: Internal Medicine

## 2012-04-23 ENCOUNTER — Ambulatory Visit (INDEPENDENT_AMBULATORY_CARE_PROVIDER_SITE_OTHER): Payer: BC Managed Care – PPO | Admitting: Family

## 2012-04-23 ENCOUNTER — Other Ambulatory Visit: Payer: Self-pay | Admitting: Adult Health

## 2012-04-23 DIAGNOSIS — Q249 Congenital malformation of heart, unspecified: Secondary | ICD-10-CM

## 2012-04-23 DIAGNOSIS — Z954 Presence of other heart-valve replacement: Secondary | ICD-10-CM

## 2012-04-23 DIAGNOSIS — I059 Rheumatic mitral valve disease, unspecified: Secondary | ICD-10-CM

## 2012-04-23 DIAGNOSIS — I824Y9 Acute embolism and thrombosis of unspecified deep veins of unspecified proximal lower extremity: Secondary | ICD-10-CM

## 2012-04-23 DIAGNOSIS — Z9889 Other specified postprocedural states: Secondary | ICD-10-CM

## 2012-04-23 DIAGNOSIS — D6859 Other primary thrombophilia: Secondary | ICD-10-CM

## 2012-04-23 DIAGNOSIS — I4891 Unspecified atrial fibrillation: Secondary | ICD-10-CM

## 2012-04-23 DIAGNOSIS — I359 Nonrheumatic aortic valve disorder, unspecified: Secondary | ICD-10-CM

## 2012-05-09 ENCOUNTER — Other Ambulatory Visit: Payer: Self-pay | Admitting: Internal Medicine

## 2012-05-16 ENCOUNTER — Other Ambulatory Visit: Payer: Self-pay | Admitting: Adult Health

## 2012-05-16 ENCOUNTER — Other Ambulatory Visit: Payer: Self-pay | Admitting: Internal Medicine

## 2012-05-20 ENCOUNTER — Telehealth: Payer: Self-pay | Admitting: Internal Medicine

## 2012-05-20 DIAGNOSIS — R002 Palpitations: Secondary | ICD-10-CM

## 2012-05-20 NOTE — Telephone Encounter (Signed)
New Problem:    Called in wanting to know if the holter monitor appointment that she discussed with Dr. [name] this past weekend had been scheduled yet.  Please call back.

## 2012-05-20 NOTE — Telephone Encounter (Signed)
Thank you :)

## 2012-05-20 NOTE — Telephone Encounter (Signed)
Called patient back. She states that she spoke with Dr.Ross over the weekend because of increase episodes of palpitations and was advised that she needed to be set up for a 48 hour holter monitor. Advised will let Marcos Eke know and she will call her back to schedule.

## 2012-05-21 ENCOUNTER — Encounter: Payer: BC Managed Care – PPO | Admitting: Family

## 2012-05-21 ENCOUNTER — Telehealth: Payer: Self-pay

## 2012-05-21 ENCOUNTER — Telehealth: Payer: Self-pay | Admitting: Internal Medicine

## 2012-05-21 NOTE — Telephone Encounter (Signed)
Follow-up:    Last message was closed prematurely. Please call back.

## 2012-05-21 NOTE — Telephone Encounter (Signed)
Follow-up: ° ° ° °Patient called in to follow-up on her initial call.  Please call back. °

## 2012-05-21 NOTE — Telephone Encounter (Signed)
New Problem:    Patient called in wanting to schedule a monitor placement appointment no later than Thurs because her insurance policy changes on Friday.  See phone message from 05/20/12.  Please call back.

## 2012-05-23 ENCOUNTER — Encounter (INDEPENDENT_AMBULATORY_CARE_PROVIDER_SITE_OTHER): Payer: BC Managed Care – PPO

## 2012-05-23 DIAGNOSIS — R002 Palpitations: Secondary | ICD-10-CM

## 2012-05-29 ENCOUNTER — Other Ambulatory Visit: Payer: Self-pay | Admitting: Internal Medicine

## 2012-06-04 ENCOUNTER — Ambulatory Visit: Payer: BC Managed Care – PPO | Admitting: Internal Medicine

## 2012-06-04 ENCOUNTER — Ambulatory Visit (INDEPENDENT_AMBULATORY_CARE_PROVIDER_SITE_OTHER): Payer: BC Managed Care – PPO | Admitting: Family

## 2012-06-04 DIAGNOSIS — Z9889 Other specified postprocedural states: Secondary | ICD-10-CM

## 2012-06-04 DIAGNOSIS — Z954 Presence of other heart-valve replacement: Secondary | ICD-10-CM

## 2012-06-04 DIAGNOSIS — D6859 Other primary thrombophilia: Secondary | ICD-10-CM

## 2012-06-04 DIAGNOSIS — I359 Nonrheumatic aortic valve disorder, unspecified: Secondary | ICD-10-CM

## 2012-06-04 DIAGNOSIS — Q249 Congenital malformation of heart, unspecified: Secondary | ICD-10-CM

## 2012-06-04 DIAGNOSIS — I824Y9 Acute embolism and thrombosis of unspecified deep veins of unspecified proximal lower extremity: Secondary | ICD-10-CM

## 2012-06-04 DIAGNOSIS — I10 Essential (primary) hypertension: Secondary | ICD-10-CM

## 2012-06-04 DIAGNOSIS — I059 Rheumatic mitral valve disease, unspecified: Secondary | ICD-10-CM

## 2012-06-04 DIAGNOSIS — I4891 Unspecified atrial fibrillation: Secondary | ICD-10-CM

## 2012-06-04 LAB — BASIC METABOLIC PANEL
Calcium: 9.3 mg/dL (ref 8.4–10.5)
Chloride: 96 mEq/L (ref 96–112)
Creatinine, Ser: 0.8 mg/dL (ref 0.4–1.2)
Sodium: 138 mEq/L (ref 135–145)

## 2012-06-04 LAB — POCT INR
INR: 2
INR: 2.2
INR: 2.2

## 2012-06-04 MED ORDER — WARFARIN SODIUM 4 MG PO TABS: 4.0000 mg | ORAL_TABLET | Freq: Every day | ORAL | Status: DC

## 2012-06-04 NOTE — Patient Instructions (Addendum)
Take an extra 1/2 tab today. Continue 6mg  on Tues and Thursdays. Then continue 4 mg daily. Recheck in 2 weeks.    Latest dosing instructions   Total Sun Mon Tue Wed Thu Fri Sat   32 4 mg 4 mg 6 mg 4 mg 6 mg 4 mg 4 mg    (4 mg1) (4 mg1) (4 mg1.5) (4 mg1) (4 mg1.5) (4 mg1) (4 mg1)

## 2012-06-10 ENCOUNTER — Telehealth: Payer: Self-pay | Admitting: *Deleted

## 2012-06-10 NOTE — Telephone Encounter (Signed)
LMOM that Holter monitor was only partially scanned. Will finish this week when rep from company comes and will call her back with final report.

## 2012-06-13 NOTE — Telephone Encounter (Signed)
Dr.Ross called patient with Holter Monitor report. Copy sent to Dr. Wynn Maudlin in [place], [place].

## 2012-06-17 ENCOUNTER — Other Ambulatory Visit: Payer: Self-pay | Admitting: Adult Health

## 2012-06-17 ENCOUNTER — Other Ambulatory Visit: Payer: Self-pay | Admitting: Internal Medicine

## 2012-06-18 ENCOUNTER — Ambulatory Visit (INDEPENDENT_AMBULATORY_CARE_PROVIDER_SITE_OTHER): Payer: BC Managed Care – PPO | Admitting: Family

## 2012-06-18 DIAGNOSIS — I059 Rheumatic mitral valve disease, unspecified: Secondary | ICD-10-CM

## 2012-06-18 DIAGNOSIS — Z954 Presence of other heart-valve replacement: Secondary | ICD-10-CM

## 2012-06-18 DIAGNOSIS — I359 Nonrheumatic aortic valve disorder, unspecified: Secondary | ICD-10-CM

## 2012-06-18 DIAGNOSIS — Z9889 Other specified postprocedural states: Secondary | ICD-10-CM

## 2012-06-18 DIAGNOSIS — I4891 Unspecified atrial fibrillation: Secondary | ICD-10-CM

## 2012-06-18 DIAGNOSIS — I824Y9 Acute embolism and thrombosis of unspecified deep veins of unspecified proximal lower extremity: Secondary | ICD-10-CM

## 2012-06-18 DIAGNOSIS — D6859 Other primary thrombophilia: Secondary | ICD-10-CM

## 2012-06-18 DIAGNOSIS — Q249 Congenital malformation of heart, unspecified: Secondary | ICD-10-CM

## 2012-06-18 NOTE — Patient Instructions (Addendum)
Continue 6mg  on Tues and Thursdays. Then continue 4 mg daily. Recheck in 3 weeks.    Latest dosing instructions   Total Sun Mon Tue Wed Thu Fri Sat   32 4 mg 4 mg 6 mg 4 mg 6 mg 4 mg 4 mg    (4 mg1) (4 mg1) (4 mg1.5) (4 mg1) (4 mg1.5) (4 mg1) (4 mg1)

## 2012-06-24 ENCOUNTER — Other Ambulatory Visit: Payer: Self-pay | Admitting: Cardiology

## 2012-06-24 MED ORDER — HYDROCHLOROTHIAZIDE 50 MG PO TABS: 50.0000 mg | ORAL_TABLET | Freq: Every day | ORAL | Status: DC

## 2012-07-04 ENCOUNTER — Encounter: Payer: Self-pay | Admitting: Internal Medicine

## 2012-07-09 ENCOUNTER — Encounter: Payer: BC Managed Care – PPO | Admitting: Family

## 2012-07-16 ENCOUNTER — Telehealth: Payer: Self-pay | Admitting: Family

## 2012-07-16 NOTE — Telephone Encounter (Signed)
Left message on personally identified voicemail to advise pt to schedule pt/inr appt for 1/6 or 07/30/2012

## 2012-07-16 NOTE — Telephone Encounter (Signed)
Needs an INR visit around Jan 6th. I saw the results from cardiology. That would be 6 weeks from that point.

## 2012-07-29 ENCOUNTER — Ambulatory Visit (INDEPENDENT_AMBULATORY_CARE_PROVIDER_SITE_OTHER): Payer: BC Managed Care – PPO | Admitting: Family

## 2012-07-29 DIAGNOSIS — D6859 Other primary thrombophilia: Secondary | ICD-10-CM

## 2012-07-29 DIAGNOSIS — I059 Rheumatic mitral valve disease, unspecified: Secondary | ICD-10-CM

## 2012-07-29 DIAGNOSIS — I824Y9 Acute embolism and thrombosis of unspecified deep veins of unspecified proximal lower extremity: Secondary | ICD-10-CM

## 2012-07-29 DIAGNOSIS — I359 Nonrheumatic aortic valve disorder, unspecified: Secondary | ICD-10-CM

## 2012-07-29 DIAGNOSIS — I4891 Unspecified atrial fibrillation: Secondary | ICD-10-CM

## 2012-07-29 DIAGNOSIS — Z954 Presence of other heart-valve replacement: Secondary | ICD-10-CM

## 2012-07-29 LAB — POCT INR: INR: 2.9

## 2012-07-29 NOTE — Patient Instructions (Signed)
Continue current dose and recheck in 4 weeks    Latest dosing instructions   Total Sun Mon Tue Wed Thu Fri Sat   32 4 mg 4 mg 6 mg 4 mg 6 mg 4 mg 4 mg    (4 mg1) (4 mg1) (4 mg1.5) (4 mg1) (4 mg1.5) (4 mg1) (4 mg1)

## 2012-08-02 ENCOUNTER — Other Ambulatory Visit: Payer: Self-pay | Admitting: Internal Medicine

## 2012-08-08 ENCOUNTER — Telehealth: Payer: Self-pay | Admitting: Internal Medicine

## 2012-08-08 NOTE — Telephone Encounter (Signed)
None available since going generic and Left message on machine For pt

## 2012-08-08 NOTE — Telephone Encounter (Signed)
Patient called stating that she need samples of cymbalta 30mg  to last for a week at least until her insurance allows her to have it. Please assist.

## 2012-08-09 ENCOUNTER — Ambulatory Visit (INDEPENDENT_AMBULATORY_CARE_PROVIDER_SITE_OTHER): Payer: BC Managed Care – PPO | Admitting: Internal Medicine

## 2012-08-09 ENCOUNTER — Telehealth: Payer: Self-pay | Admitting: Internal Medicine

## 2012-08-09 ENCOUNTER — Encounter: Payer: Self-pay | Admitting: Internal Medicine

## 2012-08-09 VITALS — BP 90/60 | HR 134 | Ht 65.0 in | Wt 138.0 lb

## 2012-08-09 DIAGNOSIS — I4892 Unspecified atrial flutter: Secondary | ICD-10-CM

## 2012-08-09 MED ORDER — DULOXETINE HCL 30 MG PO CPEP: 30.0000 mg | ORAL_CAPSULE | Freq: Every day | ORAL | Status: DC

## 2012-08-09 MED ORDER — WARFARIN SODIUM 4 MG PO TABS: 4.0000 mg | ORAL_TABLET | Freq: Every day | ORAL | Status: DC

## 2012-08-09 NOTE — Progress Notes (Signed)
HPI Patient is a 37 year old with a history of AV canal defect s/p repair in early life. In May 1991 she underwent modified Konno operation with a 25 mm St Jude valve in the aortic position. In 1995 she had a 23 mm Carbomedics valve placed in the mitral position for a partially obstructed valve. 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 All surgeries were done at [facility]. In December 2012 she underwent MV replacement (3rd) with St Jude prosthesis. She also had TV reconstruction, MAZE procedure, isthmus ablation and reconstruction of her tricuspid valve.. Also had repair of ascending aortic tear. I last saw her in clinic in early March. She had an ablation of atrial flutter in March 2013 by Dr Judi Cong Later in the spring she had problems with palpitations.  Holter showed atrial ectopy, possible short bursts of atrial tach.  After discussion with Dr. Judi Cong patient was started on Sotalol 160 bid  Ths was decreased to 80 bid due to fatigue.  The patient has been doing very well with few palpitations.  She is active  Goes to gym.  Takes dance classes.  Denies signif SOB  Sleeping OK  No abdomenal bloating  Last night she went to jazz dance class  Did OK  Came home  While watching TV felt her heart racing  Denied dizziness or signif SOB  Pulse in 130s then 110s. Does note a little abdomenal bloat this AM (what she senses when fluid is up a little).  Otherwise feels ok  No dizziness. She did forget to take Sotalol last Sunday.    Allergies  Allergen Reactions  . Povidone Other (See Comments)    Like sunburn. Really bad rash. Do not use for cath procedures    Current Outpatient Prescriptions  Medication Sig Dispense Refill  . aspirin 81 MG tablet Take 81 mg by mouth daily.        . CYMBALTA 30 MG capsule TAKE ONE CAPSULE BY MOUTH EVERY DAY  30 capsule  0  . furosemide (LASIX) 40 MG tablet Take 40 mg by mouth daily.       . hydrochlorothiazide (HYDRODIURIL) 50 MG tablet Take 1 tablet (50  mg total) by mouth daily.  30 tablet  1  . norethindrone-ethinyl estradiol 1/35 (ORTHO-NOVUM, NORTREL,CYCLAFEM) tablet Take 1 tablet by mouth daily.  1 Package  11  . potassium chloride (K-DUR) 10 MEQ tablet Take 3 tablets (30 mEq total) by mouth daily.  90 tablet  11  . sotalol (BETAPACE) 120 MG tablet Take 80mg  in the am  And 120mg    In the pm      . spironolactone (ALDACTONE) 50 MG tablet TAKE 1 TABLET BY MOUTH DAILY  30 tablet  0  . warfarin (COUMADIN) 4 MG tablet Take 1 tablet (4 mg total) by mouth daily. 4mg  everyday except Tues and Thursday 6mg .  34 tablet  0  . zaleplon (SONATA) 10 MG capsule Take 10 mg by mouth at bedtime as needed. For sleep        Past Medical History  Diagnosis Date  . 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 Migraine   . Depression   . Hepatitis C   . Atrioventricular canal (AVC), complete     Has had 7 sternotomies for repair of this  . Acute on chronic heart failure     periprosthetic mitral valve leak  . H/O mitral valve replacement     a.  x 3 -  most recent 06/2011 - St. Jude MVR  . History of aortic valve replacement     a. 25mm SJM 1995  . H/O maze procedure     06/2011  . 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 Aorto ventricular canal defect repair     age 37 mths  . Mitral valve replacement 06/2011    Third mitral valve replacement  . Aortic valve replacement   . Sternotomy     She has had 7 sternotomy  . Cardioversion 08/17/2011    Procedure: CARDIOVERSION;  Surgeon: Luis Abed, MD;  Location: [facility] OR;  Service: Cardiovascular;  Laterality: N/A;  . Cardioversion 09/11/2011    Procedure: CARDIOVERSION;  Surgeon: Pricilla Riffle, MD;  Location: [facility] OR;  Service: Cardiovascular;  Laterality: N/A;  Carioversion  . Cardioversion 09/29/2011    Procedure: CARDIOVERSION;  Surgeon: Vesta Mixer, MD;  Location: [facility] OR;  Service: Cardiovascular;  Laterality: N/A;     Family History  Problem Relation Age of Onset  . Colon cancer    . Colon polyps    . Hyperlipidemia Mother   . Depression Mother   . Hyperlipidemia Father   . Hypertension Father     History   Social History  . Marital Status: Single    Spouse Name: N/A    Number of Children: N/A  . Years of Education: N/A   Occupational History  . legal specialist    Social History Main Topics  . Smoking status: Never Smoker   . Smokeless tobacco: Never Used  . Alcohol Use: Yes     Comment: occasionally  . Drug Use: No  . Sexually Active: Yes   Other Topics Concern  . Not on file   Social History Narrative  . No narrative on file    Review of Systems:  All systems reviewed.  They are negative to the above problem except as previously stated.  Vital Signs: BP 90/60  Pulse 134  Ht 5\' 5"  (1.651 m)  Wt 138 lb (62.596 kg)  BMI 22.96 kg/m2  Physical Exam Patient is in NAD HEENT:  Normocephalic, atraumatic. EOMI, PERRLA.  Neck: JVP is mildly increase  Lungs: clear to auscultation. No rales no wheezes.  Heart: Regular rate and rhythm.Tachy. Crisp valve sounds.   No significant murmurs. PMI not displaced.  Abdomen:  Supple, nontender. Normal bowel sounds. No masses. No hepatomegaly.  Extremities:   Good distal pulses throughout. No lower extremity edema.  Musculoskeletal :moving all extremities.   EKG  Atrial flutter  135 bpm.  RBBB.  LAFB  Assessment and Plan:  1.  Atrial flutter. It sounds like it started last night  Tolerating well for now. I have sent EKG to [facility]   I have contacted E. Colvin.  She has an appt in [place] next Friday.  Initially he recomm cardioversion.  On discussion with Toma has recomm increasing Sotalol to 160 bid  Get EKG on Tuesday  If has not converted plan cardioversion. Keep on coumadin  2.  S/p AVR, MVR.  Continue coumadin.

## 2012-08-09 NOTE — Telephone Encounter (Signed)
[place] [facility] [place] and is needed a refill on warfarin. Last INR was on 07/29/12 and they are scheduled to come back again on 08/27/12

## 2012-08-11 ENCOUNTER — Other Ambulatory Visit: Payer: Self-pay | Admitting: Internal Medicine

## 2012-08-12 ENCOUNTER — Ambulatory Visit (INDEPENDENT_AMBULATORY_CARE_PROVIDER_SITE_OTHER): Payer: BC Managed Care – PPO

## 2012-08-12 VITALS — BP 111/78 | HR 63 | Ht 65.0 in | Wt 139.0 lb

## 2012-08-12 DIAGNOSIS — I499 Cardiac arrhythmia, unspecified: Secondary | ICD-10-CM

## 2012-08-12 NOTE — Progress Notes (Signed)
**Note De-Identified Jessica [name] Obfuscation** Pt. arrives in office for an EKG per Dr. [name]. At pt's last OV with Dr. [name] on 08/09/12 pt. was advised to increase Sotalol to 160 mg twice daily and to come to office today for EKG to determine if pt has converted to NSR. EKG obtained and given to Annice Pih, Dr. [name] nurse. She c/o headaches and light headedness. Her BP at this visit is 111/78 and her HR is 63.  Pt is advised that we will contact her with Dr. [name]' recommendations.

## 2012-08-23 NOTE — Addendum Note (Signed)
Addended by: Reine Just on: 08/23/2012 10:45 AM   Modules accepted: Orders

## 2012-08-27 ENCOUNTER — Ambulatory Visit (INDEPENDENT_AMBULATORY_CARE_PROVIDER_SITE_OTHER): Payer: BC Managed Care – PPO | Admitting: Family

## 2012-08-27 DIAGNOSIS — I4891 Unspecified atrial fibrillation: Secondary | ICD-10-CM

## 2012-08-27 DIAGNOSIS — I359 Nonrheumatic aortic valve disorder, unspecified: Secondary | ICD-10-CM

## 2012-08-27 DIAGNOSIS — D6859 Other primary thrombophilia: Secondary | ICD-10-CM

## 2012-08-27 DIAGNOSIS — Z954 Presence of other heart-valve replacement: Secondary | ICD-10-CM

## 2012-08-27 DIAGNOSIS — I059 Rheumatic mitral valve disease, unspecified: Secondary | ICD-10-CM

## 2012-08-27 DIAGNOSIS — I824Y9 Acute embolism and thrombosis of unspecified deep veins of unspecified proximal lower extremity: Secondary | ICD-10-CM

## 2012-08-27 NOTE — Patient Instructions (Addendum)
Continue current dose and recheck in 6 weeks    Latest dosing instructions   Total Sun Mon Tue Wed Thu Fri Sat   32 4 mg 4 mg 6 mg 4 mg 6 mg 4 mg 4 mg    (4 mg1) (4 mg1) (4 mg1.5) (4 mg1) (4 mg1.5) (4 mg1) (4 mg1)

## 2012-09-01 ENCOUNTER — Other Ambulatory Visit: Payer: Self-pay | Admitting: Internal Medicine

## 2012-09-25 ENCOUNTER — Other Ambulatory Visit: Payer: Self-pay | Admitting: Internal Medicine

## 2012-09-29 ENCOUNTER — Other Ambulatory Visit: Payer: Self-pay | Admitting: Internal Medicine

## 2012-10-08 ENCOUNTER — Ambulatory Visit (INDEPENDENT_AMBULATORY_CARE_PROVIDER_SITE_OTHER): Payer: BC Managed Care – PPO | Admitting: Family

## 2012-10-08 DIAGNOSIS — I824Y9 Acute embolism and thrombosis of unspecified deep veins of unspecified proximal lower extremity: Secondary | ICD-10-CM

## 2012-10-08 DIAGNOSIS — I359 Nonrheumatic aortic valve disorder, unspecified: Secondary | ICD-10-CM

## 2012-10-08 DIAGNOSIS — Z954 Presence of other heart-valve replacement: Secondary | ICD-10-CM

## 2012-10-08 DIAGNOSIS — I824Y1 Acute embolism and thrombosis of unspecified deep veins of right proximal lower extremity: Secondary | ICD-10-CM

## 2012-10-08 DIAGNOSIS — I4891 Unspecified atrial fibrillation: Secondary | ICD-10-CM

## 2012-10-08 DIAGNOSIS — I059 Rheumatic mitral valve disease, unspecified: Secondary | ICD-10-CM

## 2012-10-08 DIAGNOSIS — D6859 Other primary thrombophilia: Secondary | ICD-10-CM

## 2012-10-08 LAB — POCT INR: INR: 2.5

## 2012-10-08 NOTE — Patient Instructions (Addendum)
Continue current dose and recheck in 6 weeks  Anticoagulation Dose Instructions as of 10/08/2012     Jessica [name] Tue Wed Thu Fri Sat   New Dose 4 mg 4 mg 6 mg 4 mg 6 mg 4 mg 4 mg    Description       Continue current dose and recheck in 6 weeks

## 2012-10-28 ENCOUNTER — Telehealth: Payer: Self-pay | Admitting: Internal Medicine

## 2012-10-28 ENCOUNTER — Other Ambulatory Visit: Payer: Self-pay | Admitting: Internal Medicine

## 2012-10-28 NOTE — Telephone Encounter (Signed)
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 She will be contacting the office .

## 2012-11-06 ENCOUNTER — Other Ambulatory Visit: Payer: Self-pay | Admitting: *Deleted

## 2012-11-06 MED ORDER — SPIRONOLACTONE 50 MG PO TABS: ORAL_TABLET | ORAL | Status: DC

## 2012-11-07 ENCOUNTER — Other Ambulatory Visit: Payer: Self-pay

## 2012-11-07 MED ORDER — SOTALOL HCL 160 MG PO TABS: 160.0000 mg | ORAL_TABLET | Freq: Two times a day (BID) | ORAL | Status: DC

## 2012-11-14 ENCOUNTER — Telehealth: Payer: Self-pay | Admitting: Internal Medicine

## 2012-11-14 NOTE — Telephone Encounter (Signed)
New Prob      Pt has some questions for Dr. [name], please call.

## 2012-11-14 NOTE — Telephone Encounter (Signed)
Patient states that for the last 3-4 weeks she has experienced unusual belly swelling.  By the end of each day she has gained an inch in her belly of fluid.  She increased her Lasix to 40mg  daily and her K+ last week in addition to her Aldactone and HCTZ.  The increased lasix is pulling off the fluid but it is not staying off.  Will forward to Dr. [name] for review.

## 2012-11-15 NOTE — Telephone Encounter (Signed)
Spoke to patient on phone Thursday night.   She described swelling  Down in AM  Up during day No SOB  Active  Rec:  She is in [place] for weekend. Would recomm CMET, BNP, CBC INR on Monday  I am in [place].

## 2012-11-18 ENCOUNTER — Other Ambulatory Visit: Payer: BC Managed Care – PPO

## 2012-11-18 ENCOUNTER — Telehealth: Payer: Self-pay | Admitting: Internal Medicine

## 2012-11-18 ENCOUNTER — Other Ambulatory Visit: Payer: Self-pay | Admitting: *Deleted

## 2012-11-18 ENCOUNTER — Ambulatory Visit (INDEPENDENT_AMBULATORY_CARE_PROVIDER_SITE_OTHER): Payer: BC Managed Care – PPO | Admitting: *Deleted

## 2012-11-18 DIAGNOSIS — B1920 Unspecified viral hepatitis C without hepatic coma: Secondary | ICD-10-CM

## 2012-11-18 DIAGNOSIS — R609 Edema, unspecified: Secondary | ICD-10-CM

## 2012-11-18 LAB — PROTIME-INR
INR: 2.6 ratio — ABNORMAL HIGH (ref 0.8–1.0)
Prothrombin Time: 26.9 s — ABNORMAL HIGH (ref 10.2–12.4)

## 2012-11-18 LAB — CBC WITH DIFFERENTIAL/PLATELET
Eosinophils Relative: 2 % (ref 0.0–5.0)
HCT: 45.4 % (ref 36.0–46.0)
Hemoglobin: 15.4 g/dL — ABNORMAL HIGH (ref 12.0–15.0)
Lymphs Abs: 1.6 10*3/uL (ref 0.7–4.0)
MCV: 94.2 fl (ref 78.0–100.0)
Monocytes Absolute: 0.4 10*3/uL (ref 0.1–1.0)
Monocytes Relative: 5.2 % (ref 3.0–12.0)
Neutro Abs: 5.5 10*3/uL (ref 1.4–7.7)
Platelets: 242 10*3/uL (ref 150.0–400.0)
WBC: 7.6 10*3/uL (ref 4.5–10.5)

## 2012-11-18 LAB — BRAIN NATRIURETIC PEPTIDE: Pro B Natriuretic peptide (BNP): 69 pg/mL (ref 0.0–100.0)

## 2012-11-18 LAB — BASIC METABOLIC PANEL
BUN: 23 mg/dL (ref 6–23)
CO2: 28 mEq/L (ref 19–32)
Calcium: 10.1 mg/dL (ref 8.4–10.5)
Creatinine, Ser: 0.9 mg/dL (ref 0.4–1.2)
GFR: 77.39 mL/min (ref >60.00)
Glucose, Bld: 85 mg/dL (ref 70–99)

## 2012-11-18 LAB — HEPATIC FUNCTION PANEL
Albumin: 4.6 g/dL (ref 3.5–5.2)
Total Protein: 8.3 g/dL (ref 6.0–8.3)

## 2012-11-18 NOTE — Telephone Encounter (Signed)
Thank you. It appears she had them done with Dr. [name]. No results available yet. I will keep checking.

## 2012-11-18 NOTE — Telephone Encounter (Signed)
Pt wanted to make you aware that she had lab work done this morning, but doesn't have the results yet.

## 2012-11-19 ENCOUNTER — Encounter: Payer: Self-pay | Admitting: Internal Medicine

## 2012-11-19 ENCOUNTER — Encounter: Payer: BC Managed Care – PPO | Admitting: Family

## 2012-11-19 ENCOUNTER — Telehealth: Payer: Self-pay | Admitting: *Deleted

## 2012-11-19 ENCOUNTER — Ambulatory Visit (INDEPENDENT_AMBULATORY_CARE_PROVIDER_SITE_OTHER): Payer: BC Managed Care – PPO | Admitting: Family

## 2012-11-19 ENCOUNTER — Ambulatory Visit (INDEPENDENT_AMBULATORY_CARE_PROVIDER_SITE_OTHER): Payer: BC Managed Care – PPO | Admitting: Internal Medicine

## 2012-11-19 ENCOUNTER — Encounter (INDEPENDENT_AMBULATORY_CARE_PROVIDER_SITE_OTHER): Payer: BC Managed Care – PPO

## 2012-11-19 VITALS — BP 118/80 | HR 64 | Ht 65.0 in | Wt 140.5 lb

## 2012-11-19 DIAGNOSIS — Z954 Presence of other heart-valve replacement: Secondary | ICD-10-CM

## 2012-11-19 DIAGNOSIS — I059 Rheumatic mitral valve disease, unspecified: Secondary | ICD-10-CM

## 2012-11-19 DIAGNOSIS — I824Y1 Acute embolism and thrombosis of unspecified deep veins of right proximal lower extremity: Secondary | ICD-10-CM

## 2012-11-19 DIAGNOSIS — I824Y9 Acute embolism and thrombosis of unspecified deep veins of unspecified proximal lower extremity: Secondary | ICD-10-CM

## 2012-11-19 DIAGNOSIS — R011 Cardiac murmur, unspecified: Secondary | ICD-10-CM

## 2012-11-19 DIAGNOSIS — I4891 Unspecified atrial fibrillation: Secondary | ICD-10-CM

## 2012-11-19 DIAGNOSIS — I359 Nonrheumatic aortic valve disorder, unspecified: Secondary | ICD-10-CM

## 2012-11-19 DIAGNOSIS — D6859 Other primary thrombophilia: Secondary | ICD-10-CM

## 2012-11-19 LAB — POCT INR: INR: 2.6

## 2012-11-19 NOTE — Patient Instructions (Addendum)
Your physician has recommended that you wear a holter monitor. Holter monitors are medical devices that record the heart's electrical activity. Doctors most often use these monitors to diagnose arrhythmias. Arrhythmias are problems with the speed or rhythm of the heartbeat. The monitor is a small, portable device. You can wear one while you do your normal daily activities. This is usually used to diagnose what is causing palpitations/syncope (passing out).  Your physician has requested that you have an echocardiogram. Echocardiography is a painless test that uses sound waves to create images of your heart. It provides your doctor with information about the size and shape of your heart and how well your heart's chambers and valves are working. This procedure takes approximately one hour. There are no restrictions for this procedure.

## 2012-11-19 NOTE — Telephone Encounter (Signed)
Pt coming in to see Dr. [name] today at 3pm.

## 2012-11-19 NOTE — Telephone Encounter (Signed)
24 hr holter monitor placed on Pt. 11/19/12 TK

## 2012-11-19 NOTE — Progress Notes (Signed)
HPI Patient is a 37 year old with a history of AV canal defect s/p repair in early life. In May 1991 she underwent modified Konno operation with a 25 mm St Jude valve in the aortic position. In 1995 she had a 23 mm Carbomedics valve placed in the mitral position for a partially obstructed valve. 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 All surgeries were done at [facility]. In December 2012 she underwent MV replacement (3rd) with St Jude prosthesis. She also had TV reconstruction, MAZE procedure, isthmus ablation and reconstruction of her tricuspid valve.. Also had repair of ascending aortic tear. I last saw her in clinic in early March. She had an ablation of atrial flutter in March 2013 by Dr Judi Cong  Later in the spring she had problems with palpitations. Holter showed atrial ectopy, possible short bursts of atrial tach. After discussion with Dr. Judi Cong patient was started on Sotalol 160 bid Ths was decreased to 80 bid due to fatigue.  She had recurrent atrial flutter in the fall  Increased sotalol and she converted to SR.  Lise called last Thursday  She said over the past few wks she had noticed abdominal bloating in the afternoon and evening.  She denied SOB  I told her to come in for labs..  She had these yesterday and they were unremarkable The patient said that this weekend she noticed some SOB with walking  This was new.   Denies palpitations.    Allergies  Allergen Reactions  . Povidone Other (See Comments)    Like sunburn. Really bad rash. Do not use for cath procedures    Current Outpatient Prescriptions  Medication Sig Dispense Refill  . aspirin 81 MG tablet Take 81 mg by mouth daily.        . DULoxetine (CYMBALTA) 30 MG capsule Take 1 capsule (30 mg total) by mouth daily.  30 capsule  6  . furosemide (LASIX) 40 MG tablet TAKE 1 TABLET BY MOUTH EVERY DAY  30 tablet  6  . hydrochlorothiazide (HYDRODIURIL) 50 MG tablet TAKE 1 TABLET BY MOUTH DAILY  30 tablet  6  .  norethindrone-ethinyl estradiol 1/35 (ORTHO-NOVUM, NORTREL,CYCLAFEM) tablet Take 1 tablet by mouth daily.  1 Package  11  . potassium chloride (K-DUR) 10 MEQ tablet TAKE 2 TABLETS BY MOUTH TWICE DAILY  120 tablet  0  . sotalol (BETAPACE) 160 MG tablet Take 1 tablet (160 mg total) by mouth 2 (two) times daily.  60 tablet  2  . spironolactone (ALDACTONE) 50 MG tablet TAKE 1 TABLET BY MOUTH EVERY DAY  90 tablet  3  . warfarin (COUMADIN) 4 MG tablet Take 1 tablet (4 mg total) by mouth daily. 4mg  everyday except Tues and Thursday 6mg .  34 tablet  3  . zaleplon (SONATA) 10 MG capsule Take 10 mg by mouth at bedtime as needed. For sleep       No current facility-administered medications for this visit.    Past Medical History  Diagnosis Date  . 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 Migraine   . Depression   . Hepatitis C   . Atrioventricular canal (AVC), complete     Has had 7 sternotomies for repair of this  . Acute on chronic heart failure     periprosthetic mitral valve leak  . H/O mitral valve replacement     a. x 3 -  most recent 06/2011 - St. Jude MVR  .  History of aortic valve replacement     a. 25mm SJM 1995  . H/O maze procedure     06/2011  . History of tricuspid valve repair     06/2011    Past Surgical History  Procedure Laterality Date  . Aorto ventricular canal defect repair      age 65 mths  . Mitral valve replacement  06/2011    Third mitral valve replacement  . Aortic valve replacement    . Sternotomy      She has had 7 sternotomy  . Cardioversion  08/17/2011    Procedure: CARDIOVERSION;  Surgeon: Luis Abed, MD;  Location: [facility] OR;  Service: Cardiovascular;  Laterality: N/A;  . Cardioversion  09/11/2011    Procedure: CARDIOVERSION;  Surgeon: Pricilla Riffle, MD;  Location: [facility] OR;  Service: Cardiovascular;  Laterality: N/A;  Carioversion  . Cardioversion  09/29/2011    Procedure: CARDIOVERSION;  Surgeon: Vesta Mixer, MD;  Location: [facility]  OR;  Service: Cardiovascular;  Laterality: N/A;    Family History  Problem Relation Age of Onset  . Colon cancer    . Colon polyps    . Hyperlipidemia Mother   . Depression Mother   . Hyperlipidemia Father   . Hypertension Father     History   Social History  . Marital Status: Single    Spouse Name: N/A    Number of Children: N/A  . Years of Education: N/A   Occupational History  . legal specialist    Social History Main Topics  . Smoking status: Never Smoker   . Smokeless tobacco: Never Used  . Alcohol Use: Yes     Comment: occasionally  . Drug Use: No  . Sexually Active: Yes   Other Topics Concern  . Not on file   Social History Narrative  . No narrative on file    Review of Systems:  All systems reviewed.  They are negative to the above problem except as previously stated.  Vital Signs: BP 118/80  Ht 5\' 5"  (1.651 m)  Wt 140 lb 8 oz (63.73 kg)  BMI 23.38 kg/m2  Physical Exam Patient is in NAD HEENT:  Normocephalic, atraumatic. EOMI, PERRLA.  Neck: JVP is normal.  No bruits.  Lungs: clear to auscultation. No rales no wheezes.  Heart: Regular rate and rhythm. Normal S1 S2.  Crisp AV sounds.  Gr I-II systolic murmur base  Gr II/VI diastolic murmur LSB.  Abdomen:  Supple, nontender. Normal bowel sounds  Mildly bloated.  No hepatomegaly.    Extremities:   Good distal pulses throughout. No lower extremity edema.  Musculoskeletal :moving all extremities.  Neuro:   alert and oriented x3.  CN II-XII grossly intact.  EKG  SR 64 bpm  Occasional PCA.  RBBB.   LAFB. Assessment and Plan:   Patient is a 37 yo with history of AV canal defect, s/p multiple surgeries as noted above.  Also a hsitory of atrial arrhythmias. Presents with history of intermitt bloating.  Now some mild SOB ON exam, abdomen is a little bloated.  Otherwise volume status looks good On cardiac exam, diastolic murmur LSB is more prominent.   Labs last week were unremarkable  Recomm:  WIll get  repeat echo to evaluate valves, LV/RV function. I would also recomm holter monitor to evaluate for ectopy  Keep on same regimen.  Watch salt. Keep on anticoagulation    2.  Hep C  Follow

## 2012-11-19 NOTE — Patient Instructions (Signed)
Continue current dose and recheck in 6 weeks  Anticoagulation Dose Instructions as of 11/19/2012     Glynis Smiles Tue Wed Thu Fri Sat   New Dose 4 mg 4 mg 6 mg 4 mg 6 mg 4 mg 4 mg    Description       Continue current dose and recheck in 6 weeks

## 2012-11-20 ENCOUNTER — Ambulatory Visit (HOSPITAL_COMMUNITY): Payer: BC Managed Care – PPO | Attending: Cardiovascular Disease | Admitting: Radiology

## 2012-11-20 DIAGNOSIS — Q212 Atrioventricular septal defect, unspecified as to partial or complete: Secondary | ICD-10-CM | POA: Insufficient documentation

## 2012-11-20 DIAGNOSIS — R011 Cardiac murmur, unspecified: Secondary | ICD-10-CM

## 2012-11-20 DIAGNOSIS — I509 Heart failure, unspecified: Secondary | ICD-10-CM | POA: Insufficient documentation

## 2012-11-20 DIAGNOSIS — R0609 Other forms of dyspnea: Secondary | ICD-10-CM | POA: Insufficient documentation

## 2012-11-20 DIAGNOSIS — R0989 Other specified symptoms and signs involving the circulatory and respiratory systems: Secondary | ICD-10-CM | POA: Insufficient documentation

## 2012-11-20 NOTE — Progress Notes (Signed)
Echocardiogram performed.  

## 2012-11-25 ENCOUNTER — Other Ambulatory Visit: Payer: Self-pay | Admitting: *Deleted

## 2012-11-25 DIAGNOSIS — R1900 Intra-abdominal and pelvic swelling, mass and lump, unspecified site: Secondary | ICD-10-CM

## 2012-11-28 ENCOUNTER — Ambulatory Visit (HOSPITAL_COMMUNITY)
Admission: RE | Admit: 2012-11-28 | Discharge: 2012-11-28 | Disposition: A | Discharge status: Home / Self Care | Payer: BC Managed Care – PPO | Source: Ambulatory Visit | Attending: Internal Medicine | Admitting: Internal Medicine

## 2012-11-28 DIAGNOSIS — R141 Gas pain: Secondary | ICD-10-CM | POA: Insufficient documentation

## 2012-11-28 DIAGNOSIS — R7989 Other specified abnormal findings of blood chemistry: Secondary | ICD-10-CM | POA: Insufficient documentation

## 2012-11-28 DIAGNOSIS — R1900 Intra-abdominal and pelvic swelling, mass and lump, unspecified site: Secondary | ICD-10-CM

## 2012-11-28 DIAGNOSIS — R935 Abnormal findings on diagnostic imaging of other abdominal regions, including retroperitoneum: Secondary | ICD-10-CM | POA: Insufficient documentation

## 2012-11-28 DIAGNOSIS — R142 Eructation: Secondary | ICD-10-CM | POA: Insufficient documentation

## 2012-12-17 ENCOUNTER — Ambulatory Visit (INDEPENDENT_AMBULATORY_CARE_PROVIDER_SITE_OTHER): Payer: BC Managed Care – PPO | Admitting: Family Medicine

## 2012-12-17 ENCOUNTER — Other Ambulatory Visit: Payer: Self-pay

## 2012-12-17 ENCOUNTER — Encounter: Payer: Self-pay | Admitting: Family Medicine

## 2012-12-17 VITALS — BP 94/64 | Temp 98.7°F | Wt 140.0 lb

## 2012-12-17 DIAGNOSIS — J309 Allergic rhinitis, unspecified: Secondary | ICD-10-CM

## 2012-12-17 MED ORDER — WARFARIN SODIUM 4 MG PO TABS: 4.0000 mg | ORAL_TABLET | Freq: Every day | ORAL | Status: DC

## 2012-12-17 MED ORDER — FLUTICASONE PROPIONATE 50 MCG/ACT NA SUSP: 2.0000 | Freq: Every day | NASAL | Status: DC

## 2012-12-17 NOTE — Progress Notes (Signed)
Chief Complaint  Patient presents with  . Sinusitis    HPI:  Acute visit for sinus congestion: -started 2 days ago -symptoms: nasal congestion, drainage, HA, sneezing, itchy eyes -denies: fevers, chills, tooth pain, SOB, ear pain, cough -has tried: tylenol -hx of: sinusitis and seasonal allergies  ROS: See pertinent positives and negatives per HPI.  Past Medical History  Diagnosis Date  . 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 Migraine   . Depression   . Hepatitis C   . Atrioventricular canal (AVC), complete     Has had 7 sternotomies for repair of this  . Acute on chronic heart failure     periprosthetic mitral valve leak  . H/O mitral valve replacement     a. x 3 -  most recent 06/2011 - St. Jude MVR  . History of aortic valve replacement     a. 25mm SJM 1995  . H/O maze procedure     06/2011  . History of tricuspid valve repair     06/2011    Family History  Problem Relation Age of Onset  . Colon cancer    . Colon polyps    . Hyperlipidemia Mother   . Depression Mother   . Hyperlipidemia Father   . Hypertension Father     History   Social History  . Marital Status: Single    Spouse Name: N/A    Number of Children: N/A  . Years of Education: N/A   Occupational History  . legal specialist    Social History Main Topics  . Smoking status: Never Smoker   . Smokeless tobacco: Never Used  . Alcohol Use: Yes     Comment: occasionally  . Drug Use: No  . Sexually Active: Yes   Other Topics Concern  . None   Social History Narrative  . None    Current outpatient prescriptions:aspirin 81 MG tablet, Take 81 mg by mouth daily.  , Disp: , Rfl: ;  DULoxetine (CYMBALTA) 30 MG capsule, Take 1 capsule (30 mg total) by mouth daily., Disp: 30 capsule, Rfl: 6;  furosemide (LASIX) 40 MG tablet, TAKE 1 TABLET BY MOUTH EVERY DAY, Disp: 30 tablet, Rfl: 6;  hydrochlorothiazide (HYDRODIURIL) 50 MG tablet, TAKE 1 TABLET BY MOUTH DAILY,  Disp: 30 tablet, Rfl: 6 norethindrone-ethinyl estradiol 1/35 (ORTHO-NOVUM, NORTREL,CYCLAFEM) tablet, Take 1 tablet by mouth daily., Disp: 1 Package, Rfl: 11;  potassium chloride (K-DUR) 10 MEQ tablet, TAKE 2 TABLETS BY MOUTH TWICE DAILY, Disp: 120 tablet, Rfl: 0;  sotalol (BETAPACE) 160 MG tablet, Take 80 mg by mouth every morning. And alternate 80mg  and 120mg  every other night, Disp: , Rfl:  spironolactone (ALDACTONE) 50 MG tablet, TAKE 1 TABLET BY MOUTH EVERY DAY, Disp: 90 tablet, Rfl: 3;  warfarin (COUMADIN) 4 MG tablet, Take 1 tablet (4 mg total) by mouth daily. 4mg  everyday except Tues and Thursday 6mg ., Disp: 34 tablet, Rfl: 3;  zaleplon (SONATA) 10 MG capsule, Take 10 mg by mouth at bedtime as needed. For sleep, Disp: , Rfl: ;  fluticasone (FLONASE) 50 MCG/ACT nasal spray, Place 2 sprays into the nose daily., Disp: 16 g, Rfl: 3  EXAM:  Filed Vitals:   12/17/12 1425  BP: 94/64  Temp: 98.7 F (37.1 C)    Body mass index is 23.3 kg/(m^2).  GENERAL: vitals reviewed and listed above, alert, oriented, appears well hydrated and in no acute distress  HEENT: atraumatic, conjunttiva clear, allergic shiners, no obvious abnormalities on  inspection of external nose and ears, normal appearance of ear canals and TMs, clear nasal congestion with boggy turbinates, mild post oropharyngeal erythema with PND, no tonsillar edema or exudate, no sinus TTP  NECK: no obvious masses on inspection  LUNGS: clear to auscultation bilaterally, no wheezes, rales or rhonchi, good air movement  MS: moves all extremities without noticeable abnormality  PSYCH: pleasant and cooperative, no obvious depression or anxiety  ASSESSMENT AND PLAN:  Discussed the following assessment and plan:  Allergic rhinitis - Plan: fluticasone (FLONASE) 50 MCG/ACT nasal spray  -no signs or symptoms to suggest bacterial sinus infection -suspect allergic, she does not like to take pills as is on a lot of medications - will try INS.  Return precautions. -Patient advised to return or notify a doctor immediately if symptoms worsen or persist or new concerns arise.  Patient Instructions  -can use afrin or sinex for the next 4 days - then STOP  -start the flonase now - 2 sprays each nostril daily  -follow up if worsening or not improving       Jimi Schappert R.

## 2012-12-17 NOTE — Patient Instructions (Signed)
-  can use afrin or sinex for the next 4 days - then STOP  -start the flonase now - 2 sprays each nostril daily  -follow up if worsening or not improving

## 2012-12-22 ENCOUNTER — Other Ambulatory Visit: Payer: Self-pay | Admitting: Internal Medicine

## 2012-12-31 ENCOUNTER — Ambulatory Visit (INDEPENDENT_AMBULATORY_CARE_PROVIDER_SITE_OTHER): Payer: BC Managed Care – PPO | Admitting: Family

## 2012-12-31 DIAGNOSIS — I824Y1 Acute embolism and thrombosis of unspecified deep veins of right proximal lower extremity: Secondary | ICD-10-CM

## 2012-12-31 DIAGNOSIS — I4891 Unspecified atrial fibrillation: Secondary | ICD-10-CM

## 2012-12-31 DIAGNOSIS — I824Y9 Acute embolism and thrombosis of unspecified deep veins of unspecified proximal lower extremity: Secondary | ICD-10-CM

## 2012-12-31 DIAGNOSIS — Z954 Presence of other heart-valve replacement: Secondary | ICD-10-CM

## 2012-12-31 DIAGNOSIS — I359 Nonrheumatic aortic valve disorder, unspecified: Secondary | ICD-10-CM

## 2012-12-31 DIAGNOSIS — D6859 Other primary thrombophilia: Secondary | ICD-10-CM

## 2012-12-31 DIAGNOSIS — I059 Rheumatic mitral valve disease, unspecified: Secondary | ICD-10-CM

## 2012-12-31 NOTE — Patient Instructions (Signed)
Today take 1 tab only.Then Continue current dose and recheck in 4 weeks  Anticoagulation Dose Instructions as of 12/31/2012     Jessica [name] Tue Wed Thu Fri Sat   New Dose 4 mg 4 mg 6 mg 4 mg 6 mg 4 mg 4 mg    Description       Today take 1 tab only.Then Continue current dose and recheck in 4 weeks

## 2013-01-03 ENCOUNTER — Other Ambulatory Visit: Payer: Self-pay | Admitting: *Deleted

## 2013-01-03 MED ORDER — POTASSIUM CHLORIDE ER 10 MEQ PO TBCR: EXTENDED_RELEASE_TABLET | ORAL | Status: DC

## 2013-01-07 ENCOUNTER — Other Ambulatory Visit: Payer: Self-pay | Admitting: *Deleted

## 2013-01-07 MED ORDER — NORETHINDRONE-ETH ESTRADIOL 0.5-35 MG-MCG PO TABS: 1.0000 | ORAL_TABLET | Freq: Every day | ORAL | Status: DC

## 2013-01-22 ENCOUNTER — Encounter: Payer: Self-pay | Admitting: Family Medicine

## 2013-01-22 ENCOUNTER — Ambulatory Visit (INDEPENDENT_AMBULATORY_CARE_PROVIDER_SITE_OTHER): Payer: BC Managed Care – PPO | Admitting: Family Medicine

## 2013-01-22 VITALS — BP 110/68 | Temp 98.3°F | Wt 137.0 lb

## 2013-01-22 DIAGNOSIS — K5289 Other specified noninfective gastroenteritis and colitis: Secondary | ICD-10-CM

## 2013-01-22 DIAGNOSIS — D6859 Other primary thrombophilia: Secondary | ICD-10-CM

## 2013-01-22 DIAGNOSIS — Z8679 Personal history of other diseases of the circulatory system: Secondary | ICD-10-CM

## 2013-01-22 DIAGNOSIS — K529 Noninfective gastroenteritis and colitis, unspecified: Secondary | ICD-10-CM

## 2013-01-22 MED ORDER — ONDANSETRON HCL 4 MG PO TABS: 4.0000 mg | ORAL_TABLET | Freq: Three times a day (TID) | ORAL | Status: DC | PRN

## 2013-01-22 NOTE — Addendum Note (Signed)
Addended by: Terressa Koyanagi on: 01/22/2013 09:17 AM   Modules accepted: Level of Service

## 2013-01-22 NOTE — Patient Instructions (Addendum)
-  zofran per instruction for nausea/vomiting  -imodium (loperamide) as needed per instructions for diarrhea  -sip fluids with electrolytes all day  -see a doctor is worsening abdominal pain, inability to tolerate fluids, other concerns or not improving

## 2013-01-22 NOTE — Progress Notes (Addendum)
Chief Complaint  Patient presents with  . Diarrhea    vomiting, body ahces, headache, nausea, runny nose     HPI:  Acute visit for "stomach bug": -started yesterday -symptoms: cramping intermittent abd pain, diarrhea - no blood, nausea vomiting, low grade fever -denies: melena, blood in stools, urinary symptoms, abx or foreign travel -no periods - on continuous OCPs  ROS: See pertinent positives and negatives per HPI.  Past Medical History  Diagnosis Date  . 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 Migraine   . Depression   . Hepatitis C   . Atrioventricular canal (AVC), complete     Has had 7 sternotomies for repair of this  . Acute on chronic heart failure     periprosthetic mitral valve leak  . H/O mitral valve replacement     a. x 3 -  most recent 06/2011 - St. Jude MVR  . History of aortic valve replacement     a. 25mm SJM 1995  . H/O maze procedure     06/2011  . History of tricuspid valve repair     06/2011    Family History  Problem Relation Age of Onset  . Colon cancer    . Colon polyps    . Hyperlipidemia Mother   . Depression Mother   . Hyperlipidemia Father   . Hypertension Father     History   Social History  . Marital Status: Single    Spouse Name: N/A    Number of Children: N/A  . Years of Education: N/A   Occupational History  . legal specialist    Social History Main Topics  . Smoking status: Never Smoker   . Smokeless tobacco: Never Used  . Alcohol Use: Yes     Comment: occasionally  . Drug Use: No  . Sexually Active: Yes   Other Topics Concern  . None   Social History Narrative  . None    Current outpatient prescriptions:aspirin 81 MG tablet, Take 81 mg by mouth daily.  , Disp: , Rfl: ;  DULoxetine (CYMBALTA) 30 MG capsule, Take 1 capsule (30 mg total) by mouth daily., Disp: 30 capsule, Rfl: 6;  fluticasone (FLONASE) 50 MCG/ACT nasal spray, Place 2 sprays into the nose daily., Disp: 16 g, Rfl: 3;   furosemide (LASIX) 40 MG tablet, TAKE 1 TABLET BY MOUTH EVERY DAY, Disp: 30 tablet, Rfl: 6 hydrochlorothiazide (HYDRODIURIL) 50 MG tablet, TAKE 1 TABLET BY MOUTH DAILY, Disp: 30 tablet, Rfl: 6;  norethindrone-ethinyl estradiol (NECON 0.5/35, 28,) 0.5-35 MG-MCG tablet, Take 1 tablet by mouth daily., Disp: 1 Package, Rfl: 11;  ondansetron (ZOFRAN) 4 MG tablet, Take 1 tablet (4 mg total) by mouth every 8 (eight) hours as needed for nausea., Disp: 20 tablet, Rfl: 0 potassium chloride (KLOR-CON 10) 10 MEQ tablet, TAKE 2 TABLETS BY MOUTH TWICE DAILY, Disp: 360 tablet, Rfl: 3;  sotalol (BETAPACE) 160 MG tablet, Take 80 mg by mouth every morning. And alternate 80mg  and 120mg  every other night, Disp: , Rfl: ;  spironolactone (ALDACTONE) 50 MG tablet, TAKE 1 TABLET BY MOUTH EVERY DAY, Disp: 90 tablet, Rfl: 3;  spironolactone (ALDACTONE) 50 MG tablet, TAKE 1 TABLET BY MOUTH EVERY DAY, Disp: 30 tablet, Rfl: 0 warfarin (COUMADIN) 4 MG tablet, Take 1 tablet (4 mg total) by mouth daily. 4mg  everyday except Tues and Thursday 6mg ., Disp: 34 tablet, Rfl: 3;  zaleplon (SONATA) 10 MG capsule, Take 10 mg by mouth at bedtime as needed.  For sleep, Disp: , Rfl:   EXAM:  Filed Vitals:   01/22/13 0909  BP: 110/68  Temp: 98.3 F (36.8 C)    Body mass index is 22.8 kg/(m^2).  GENERAL: vitals reviewed and listed above, alert, oriented, appears well hydrated and in no acute distress  HEENT: atraumatic, conjunttiva clear, no obvious abnormalities on inspection of external nose and ears  NECK: no obvious masses on inspection  LUNGS: clear to auscultation bilaterally, no wheezes, rales or rhonchi, good air movement  CV: HRRR, no peripheral edema  ABD: BS+, soft NTTP, no rebound or guarding  MS: moves all extremities without noticeable abnormality  PSYCH: pleasant and cooperative, no obvious depression or anxiety  ASSESSMENT AND PLAN:  Discussed the following assessment and plan:  Gastroenteritis - Plan:  ondansetron (ZOFRAN) 4 MG tablet  Primary hypercoagulable state  CONGENITAL HEART DISEASE, HX OF  -likely viral, benign exa, supportive care and return precautions, discussed diuretic use while sick, no bleeding, has coumadin clinic visit in < 1week Recommendations per orders an instructions, risks and use of medications and return precautions discussed. -Patient advised to return or notify a doctor immediately if symptoms worsen or persist or new concerns arise.  Patient Instructions  -zofran per instruction for nausea/vomiting  -imodium (loperamide) as needed per instructions for diarrhea  -sip fluids with electrolytes all day  -see a doctor is worsening abdominal pain, inability to tolerate fluids, other concerns or not improving     Oreste Majeed R.

## 2013-01-27 ENCOUNTER — Ambulatory Visit (INDEPENDENT_AMBULATORY_CARE_PROVIDER_SITE_OTHER): Payer: BC Managed Care – PPO | Admitting: General Practice

## 2013-01-27 DIAGNOSIS — I4891 Unspecified atrial fibrillation: Secondary | ICD-10-CM

## 2013-01-27 DIAGNOSIS — D6859 Other primary thrombophilia: Secondary | ICD-10-CM

## 2013-01-27 DIAGNOSIS — I359 Nonrheumatic aortic valve disorder, unspecified: Secondary | ICD-10-CM

## 2013-01-27 DIAGNOSIS — I059 Rheumatic mitral valve disease, unspecified: Secondary | ICD-10-CM

## 2013-01-27 DIAGNOSIS — Z954 Presence of other heart-valve replacement: Secondary | ICD-10-CM

## 2013-01-28 ENCOUNTER — Encounter: Payer: BC Managed Care – PPO | Admitting: Family

## 2013-01-30 ENCOUNTER — Ambulatory Visit (INDEPENDENT_AMBULATORY_CARE_PROVIDER_SITE_OTHER): Payer: BC Managed Care – PPO | Admitting: General Practice

## 2013-01-30 ENCOUNTER — Other Ambulatory Visit: Payer: Self-pay | Admitting: Internal Medicine

## 2013-01-30 DIAGNOSIS — I4891 Unspecified atrial fibrillation: Secondary | ICD-10-CM

## 2013-01-30 DIAGNOSIS — I359 Nonrheumatic aortic valve disorder, unspecified: Secondary | ICD-10-CM

## 2013-01-30 DIAGNOSIS — Z954 Presence of other heart-valve replacement: Secondary | ICD-10-CM

## 2013-01-30 DIAGNOSIS — D6859 Other primary thrombophilia: Secondary | ICD-10-CM

## 2013-01-30 DIAGNOSIS — I059 Rheumatic mitral valve disease, unspecified: Secondary | ICD-10-CM

## 2013-02-10 ENCOUNTER — Ambulatory Visit (INDEPENDENT_AMBULATORY_CARE_PROVIDER_SITE_OTHER): Payer: BC Managed Care – PPO | Admitting: General Practice

## 2013-02-10 ENCOUNTER — Ambulatory Visit: Payer: BC Managed Care – PPO

## 2013-02-10 DIAGNOSIS — I359 Nonrheumatic aortic valve disorder, unspecified: Secondary | ICD-10-CM

## 2013-02-10 DIAGNOSIS — D6859 Other primary thrombophilia: Secondary | ICD-10-CM

## 2013-02-10 DIAGNOSIS — I4891 Unspecified atrial fibrillation: Secondary | ICD-10-CM

## 2013-02-10 DIAGNOSIS — I059 Rheumatic mitral valve disease, unspecified: Secondary | ICD-10-CM

## 2013-02-10 DIAGNOSIS — Z954 Presence of other heart-valve replacement: Secondary | ICD-10-CM

## 2013-02-18 ENCOUNTER — Ambulatory Visit (INDEPENDENT_AMBULATORY_CARE_PROVIDER_SITE_OTHER): Payer: BC Managed Care – PPO | Admitting: Gynecology

## 2013-02-18 ENCOUNTER — Encounter: Payer: Self-pay | Admitting: Gynecology

## 2013-02-18 ENCOUNTER — Other Ambulatory Visit: Payer: Self-pay | Admitting: Gynecology

## 2013-02-18 VITALS — BP 120/78 | HR 68 | Resp 18 | Ht 64.75 in | Wt 138.0 lb

## 2013-02-18 DIAGNOSIS — Z0000 Encounter for general adult medical examination without abnormal findings: Secondary | ICD-10-CM

## 2013-02-18 DIAGNOSIS — Z309 Encounter for contraceptive management, unspecified: Secondary | ICD-10-CM

## 2013-02-18 DIAGNOSIS — Z01419 Encounter for gynecological examination (general) (routine) without abnormal findings: Secondary | ICD-10-CM

## 2013-02-18 DIAGNOSIS — N949 Unspecified condition associated with female genital organs and menstrual cycle: Secondary | ICD-10-CM

## 2013-02-18 DIAGNOSIS — Z124 Encounter for screening for malignant neoplasm of cervix: Secondary | ICD-10-CM

## 2013-02-18 DIAGNOSIS — R102 Pelvic and perineal pain: Secondary | ICD-10-CM

## 2013-02-18 DIAGNOSIS — E538 Deficiency of other specified B group vitamins: Secondary | ICD-10-CM

## 2013-02-18 LAB — POCT URINALYSIS DIPSTICK

## 2013-02-18 MED ORDER — NORETHINDRONE-ETH ESTRADIOL 1-35 MG-MCG PO TABS: 1.0000 | ORAL_TABLET | Freq: Every day | ORAL | Status: DC

## 2013-02-18 NOTE — Progress Notes (Signed)
37 y.o. Single Caucasian female   [name] here for annual exam. Pt is currently sexually active.  Pt reports 1y history of vaginal dryness despite using lubricants.  Pt states that it has now progressed to pain with deep penetration.  She does not use condoms.  Pt states that symptoms started about 5m after started betapase medication.  Pt reports current partner for 2y, no history of pain with other partners.    No LMP recorded. Patient is not currently having periods (Reason: Oral contraceptives).          Sexually active: yes  The current method of family planning is OCP (estrogen/progesterone).    Exercising: yes  uses cardio equipment; strength training 3x/wk Last pap: 3-4 years ago Alcohol: less than 1 week Tobacco: no BSE: no  Hgb: PCP ; Urine: Negative  Health Maintenance  Topic Date Due  . Pap Smear  06/22/2012  . Influenza Vaccine  03/24/2013  . Tetanus/tdap  02/22/2015    Family History  Problem Relation Age of Onset  . Colon cancer    . Colon polyps    . Hyperlipidemia Mother   . Depression Mother   . Thyroid disease Mother   . Hyperlipidemia Father   . Hypertension Father   . Thyroid disease Father   . Colon cancer Paternal Grandfather     Patient Active Problem List   Diagnosis Date Noted  . Aortic valve disorders 07/27/2011  . Mitral valve disorders 07/27/2011  . Atrial fibrillation 07/27/2011  . Primary hypercoagulable state 07/27/2011  . Acute venous embolism and thrombosis of deep vessels of proximal lower extremity 07/27/2011  . Arrhythmia 12/08/2010  . B12 DEFICIENCY 03/09/2010  . ACQUIRED HEMOLYTIC ANEMIA UNSPECIFIED 11/29/2009  . ABNORMAL TRANSAMINASE-LFT'S 06/26/2008  . CONGENITAL HEART DISEASE, HX OF 06/26/2008  . HYPERLIPIDEMIA, BORDERLINE, WITH LOW HDL 06/08/2008  . ANXIETY STATE NOS 02/11/2007  . MIGRAINE NOS W/INTRACTABLE MIGRAINE 02/11/2007  . MITRAL VALVE REPLACEMENT, HX OF 02/11/2007  . AORTIC VALVE REPLACEMENT, HX OF 02/11/2007     Past Medical History  Diagnosis Date  . 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 Migraine   . Depression   . Hepatitis C   . Atrioventricular canal (AVC), complete     Has had 7 sternotomies for repair of this  . Acute on chronic heart failure     periprosthetic mitral valve leak  . H/O mitral valve replacement     a. x 3 -  most recent 06/2011 - St. Jude MVR  . History of aortic valve replacement     a. 25mm SJM 1995  . H/O maze procedure     06/2011  . History of tricuspid valve repair     06/2011  . Cancer 06/2011  . Dyspareunia   . HPV (human papilloma virus) infection 2006    Past Surgical History  Procedure Laterality Date  . Aorto ventricular canal defect repair      age 86 mths  . Mitral valve replacement  06/2011    Third mitral valve replacement  . Aortic valve replacement    . Sternotomy      She has had 7 sternotomy  . Cardioversion  08/17/2011    Procedure: CARDIOVERSION;  Surgeon: Luis Abed, MD;  Location: [facility] OR;  Service: Cardiovascular;  Laterality: N/A;  . Cardioversion  09/11/2011    Procedure: CARDIOVERSION;  Surgeon: Pricilla Riffle, MD;  Location: [facility] OR;  Service: Cardiovascular;  Laterality: N/A;  Carioversion  . Cardioversion  09/29/2011    Procedure: CARDIOVERSION;  Surgeon: Vesta Mixer, MD;  Location: [facility] OR;  Service: Cardiovascular;  Laterality: N/A;  . Wisdom tooth extraction  16 years ago    Allergies: Betadine and Povidone  Current Outpatient Prescriptions  Medication Sig Dispense Refill  . aspirin 81 MG tablet Take 81 mg by mouth daily.        . DULoxetine (CYMBALTA) 30 MG capsule Take 1 capsule (30 mg total) by mouth daily.  30 capsule  6  . furosemide (LASIX) 40 MG tablet TAKE 1 TABLET BY MOUTH EVERY DAY  30 tablet  6  . hydrochlorothiazide (HYDRODIURIL) 50 MG tablet TAKE 1 TABLET BY MOUTH DAILY  30 tablet  6  . NECON 1/35, 28, tablet TAKE ONE TABLET BY MOUTH DAILY  28 tablet  PRN  . ondansetron  (ZOFRAN) 4 MG tablet Take 1 tablet (4 mg total) by mouth every 8 (eight) hours as needed for nausea.  20 tablet  0  . potassium chloride (KLOR-CON 10) 10 MEQ tablet TAKE 2 TABLETS BY MOUTH TWICE DAILY  360 tablet  3  . sotalol (BETAPACE) 160 MG tablet Take 80 mg by mouth every morning. And alternate 80mg  and 120mg  every other night      . spironolactone (ALDACTONE) 50 MG tablet TAKE 1 TABLET BY MOUTH EVERY DAY  90 tablet  3  . warfarin (COUMADIN) 4 MG tablet Take 1 tablet (4 mg total) by mouth daily. 4mg  everyday except Tues and Thursday 6mg .  34 tablet  3  . zaleplon (SONATA) 10 MG capsule Take 10 mg by mouth at bedtime as needed. For sleep      . fluticasone (FLONASE) 50 MCG/ACT nasal spray Place 2 sprays into the nose daily.  16 g  3   No current facility-administered medications for this visit.    ROS: Pertinent items are noted in HPI.  Exam:    Ht 5' 4.75" (1.645 m)  Wt 138 lb (62.596 kg)  BMI 23.13 kg/m2 Weight change: @WEIGHTCHANGE @ Last 3 height recordings:  Ht Readings from Last 3 Encounters:  02/18/13 5' 4.75" (1.645 m)  11/19/12 5\' 5"  (1.651 m)  08/12/12 5\' 5"  (1.651 m)   General appearance: alert, cooperative and appears stated age Head: Normocephalic, without obvious abnormality, atraumatic Neck: no adenopathy, no carotid bruit, no JVD, supple, symmetrical, trachea midline and thyroid not enlarged, symmetric, no tenderness/mass/nodules Lungs: clear to auscultation bilaterally Breasts: normal appearance, no masses or tenderness Heart: irregular rate and rhythm, click from valves noted Abdomen: soft, non-tender; bowel sounds normal; no masses,  no organomegaly Extremities: extremities normal, atraumatic, no cyanosis or edema Skin: Skin color, texture, turgor normal. No rashes or lesions Lymph nodes: Cervical, supraclavicular, and axillary nodes normal. no inguinal nodes palpated Neurologic: Grossly normal   Pelvic: External genitalia:  no lesions              Urethra:  normal appearing urethra with no masses, tenderness or lesions              Bartholins and Skenes: normal                 Vagina: normal appearing vagina with normal color and discharge, no lesions, generalized tenderness              Cervix: normal appearance, no discharge, min cervical motion tenderness              Pap taken: yes  Bimanual Exam:  Uterus:  uterus is normal size, shape, consistency and tender-nonspecific                                      Adnexa:    normal adnexa in size, nontender and no masses                                      Rectovaginal: Confirms                                      Anus:  normal sphincter tone, no lesions  A: well woman no contraindication to continue use of oral contraceptives Contraceptive management     P: pap smear with HPV counseled on breast self exam, adequate intake of calcium and vitamin D, diet and exercise Nonspecific tenderness- check GC/CTM, if negative u/s-pt agrees EVOO, cocoanut oil return annually or prn   An After Visit Summary was printed and given to the patient.

## 2013-02-18 NOTE — Patient Instructions (Signed)

## 2013-02-21 ENCOUNTER — Telehealth: Payer: Self-pay | Admitting: *Deleted

## 2013-02-21 ENCOUNTER — Other Ambulatory Visit: Payer: Self-pay | Admitting: Gynecology

## 2013-02-21 DIAGNOSIS — B977 Papillomavirus as the cause of diseases classified elsewhere: Secondary | ICD-10-CM

## 2013-02-21 NOTE — Telephone Encounter (Signed)
Returning phone call °

## 2013-02-21 NOTE — Telephone Encounter (Signed)
Message copied by Lorraine Lax on Fri Feb 21, 2013  9:48 AM ------      Message from: Douglass Rivers      Created: Thu Feb 20, 2013  7:24 PM       Please inform negative ------

## 2013-02-21 NOTE — Telephone Encounter (Signed)
Patient notified see labs 

## 2013-02-21 NOTE — Telephone Encounter (Signed)
Left Message To Call Back Re: Lab Results 

## 2013-02-24 ENCOUNTER — Other Ambulatory Visit: Payer: Self-pay | Admitting: Gynecology

## 2013-03-07 ENCOUNTER — Other Ambulatory Visit: Payer: Self-pay | Admitting: *Deleted

## 2013-03-10 ENCOUNTER — Ambulatory Visit (INDEPENDENT_AMBULATORY_CARE_PROVIDER_SITE_OTHER): Payer: BC Managed Care – PPO | Admitting: General Practice

## 2013-03-10 DIAGNOSIS — E538 Deficiency of other specified B group vitamins: Secondary | ICD-10-CM

## 2013-03-10 DIAGNOSIS — Z954 Presence of other heart-valve replacement: Secondary | ICD-10-CM

## 2013-03-10 DIAGNOSIS — I4891 Unspecified atrial fibrillation: Secondary | ICD-10-CM

## 2013-03-10 DIAGNOSIS — I059 Rheumatic mitral valve disease, unspecified: Secondary | ICD-10-CM

## 2013-03-10 DIAGNOSIS — D6859 Other primary thrombophilia: Secondary | ICD-10-CM

## 2013-03-10 DIAGNOSIS — I359 Nonrheumatic aortic valve disorder, unspecified: Secondary | ICD-10-CM

## 2013-03-10 LAB — VITAMIN B12: Vitamin B-12: 332 pg/mL (ref 211–911)

## 2013-03-10 LAB — POCT INR: INR: 2.4

## 2013-03-26 ENCOUNTER — Other Ambulatory Visit: Payer: Self-pay | Admitting: Internal Medicine

## 2013-04-01 ENCOUNTER — Telehealth: Payer: Self-pay | Admitting: Gynecology

## 2013-04-01 NOTE — Telephone Encounter (Signed)
Patient called back at 1:21 left message for lathrop to call back

## 2013-04-01 NOTE — Telephone Encounter (Signed)
Pt returned call, we were requesting clarification regarding her use of ocp while on coumadin as it was refilled at most recent annual, pt states that she has been on ocp for 15y, started because she was on coumadin which she has been on for 31y.  Pt states that both her internist and cardiologist are aware of her use of ocp, they are managing her coumadin dose

## 2013-04-07 ENCOUNTER — Ambulatory Visit: Payer: BC Managed Care – PPO

## 2013-04-07 ENCOUNTER — Ambulatory Visit (INDEPENDENT_AMBULATORY_CARE_PROVIDER_SITE_OTHER): Payer: BC Managed Care – PPO | Admitting: General Practice

## 2013-04-07 DIAGNOSIS — Z954 Presence of other heart-valve replacement: Secondary | ICD-10-CM

## 2013-04-07 DIAGNOSIS — I059 Rheumatic mitral valve disease, unspecified: Secondary | ICD-10-CM

## 2013-04-07 DIAGNOSIS — D6859 Other primary thrombophilia: Secondary | ICD-10-CM

## 2013-04-07 DIAGNOSIS — I4891 Unspecified atrial fibrillation: Secondary | ICD-10-CM

## 2013-04-07 DIAGNOSIS — I359 Nonrheumatic aortic valve disorder, unspecified: Secondary | ICD-10-CM

## 2013-04-21 ENCOUNTER — Other Ambulatory Visit: Payer: Self-pay | Admitting: Internal Medicine

## 2013-04-21 ENCOUNTER — Other Ambulatory Visit: Payer: Self-pay | Admitting: Family

## 2013-04-28 ENCOUNTER — Ambulatory Visit (INDEPENDENT_AMBULATORY_CARE_PROVIDER_SITE_OTHER): Payer: BC Managed Care – PPO | Admitting: General Practice

## 2013-04-28 DIAGNOSIS — Z954 Presence of other heart-valve replacement: Secondary | ICD-10-CM

## 2013-04-28 DIAGNOSIS — I059 Rheumatic mitral valve disease, unspecified: Secondary | ICD-10-CM

## 2013-04-28 DIAGNOSIS — I4891 Unspecified atrial fibrillation: Secondary | ICD-10-CM

## 2013-04-28 DIAGNOSIS — D6859 Other primary thrombophilia: Secondary | ICD-10-CM

## 2013-04-28 DIAGNOSIS — I359 Nonrheumatic aortic valve disorder, unspecified: Secondary | ICD-10-CM

## 2013-04-28 LAB — POCT INR: INR: 2.4

## 2013-05-02 ENCOUNTER — Other Ambulatory Visit: Payer: Self-pay | Admitting: Internal Medicine

## 2013-05-04 ENCOUNTER — Other Ambulatory Visit: Payer: Self-pay | Admitting: Internal Medicine

## 2013-05-07 ENCOUNTER — Telehealth: Payer: Self-pay | Admitting: Internal Medicine

## 2013-05-07 MED ORDER — DULOXETINE HCL 30 MG PO CPEP: ORAL_CAPSULE | ORAL | Status: DC

## 2013-05-07 NOTE — Telephone Encounter (Signed)
Pt needs refill on duloxetine 30 mg #30 with refills sent to [facility] [address].

## 2013-05-07 NOTE — Telephone Encounter (Signed)
done

## 2013-05-09 ENCOUNTER — Other Ambulatory Visit: Payer: Self-pay | Admitting: Internal Medicine

## 2013-05-26 ENCOUNTER — Ambulatory Visit (INDEPENDENT_AMBULATORY_CARE_PROVIDER_SITE_OTHER): Payer: BC Managed Care – PPO | Admitting: General Practice

## 2013-05-26 DIAGNOSIS — Z954 Presence of other heart-valve replacement: Secondary | ICD-10-CM

## 2013-05-26 DIAGNOSIS — I359 Nonrheumatic aortic valve disorder, unspecified: Secondary | ICD-10-CM

## 2013-05-26 DIAGNOSIS — D6859 Other primary thrombophilia: Secondary | ICD-10-CM

## 2013-05-26 DIAGNOSIS — I4891 Unspecified atrial fibrillation: Secondary | ICD-10-CM

## 2013-05-26 DIAGNOSIS — I059 Rheumatic mitral valve disease, unspecified: Secondary | ICD-10-CM

## 2013-05-26 LAB — POCT INR: INR: 3.3

## 2013-05-29 ENCOUNTER — Other Ambulatory Visit: Payer: Self-pay

## 2013-06-05 ENCOUNTER — Other Ambulatory Visit: Payer: Self-pay | Admitting: Internal Medicine

## 2013-06-23 ENCOUNTER — Ambulatory Visit (INDEPENDENT_AMBULATORY_CARE_PROVIDER_SITE_OTHER): Payer: BC Managed Care – PPO | Admitting: General Practice

## 2013-06-23 DIAGNOSIS — I4891 Unspecified atrial fibrillation: Secondary | ICD-10-CM

## 2013-06-23 DIAGNOSIS — Z954 Presence of other heart-valve replacement: Secondary | ICD-10-CM

## 2013-06-23 DIAGNOSIS — I059 Rheumatic mitral valve disease, unspecified: Secondary | ICD-10-CM

## 2013-06-23 DIAGNOSIS — D6859 Other primary thrombophilia: Secondary | ICD-10-CM

## 2013-06-23 DIAGNOSIS — I359 Nonrheumatic aortic valve disorder, unspecified: Secondary | ICD-10-CM

## 2013-06-23 LAB — POCT INR: INR: 2.9

## 2013-06-23 NOTE — Progress Notes (Signed)
Pre-visit discussion using our clinic review tool. No additional management support is needed unless otherwise documented below in the visit note.  

## 2013-06-25 ENCOUNTER — Encounter: Payer: Self-pay | Admitting: Internal Medicine

## 2013-07-28 ENCOUNTER — Other Ambulatory Visit (INDEPENDENT_AMBULATORY_CARE_PROVIDER_SITE_OTHER): Payer: 59

## 2013-07-28 ENCOUNTER — Ambulatory Visit (INDEPENDENT_AMBULATORY_CARE_PROVIDER_SITE_OTHER): Payer: 59 | Admitting: General Practice

## 2013-07-28 DIAGNOSIS — I4891 Unspecified atrial fibrillation: Secondary | ICD-10-CM

## 2013-07-28 DIAGNOSIS — D6859 Other primary thrombophilia: Secondary | ICD-10-CM

## 2013-07-28 DIAGNOSIS — B182 Chronic viral hepatitis C: Secondary | ICD-10-CM

## 2013-07-28 DIAGNOSIS — I359 Nonrheumatic aortic valve disorder, unspecified: Secondary | ICD-10-CM

## 2013-07-28 DIAGNOSIS — I059 Rheumatic mitral valve disease, unspecified: Secondary | ICD-10-CM

## 2013-07-28 DIAGNOSIS — Z954 Presence of other heart-valve replacement: Secondary | ICD-10-CM

## 2013-07-28 LAB — COMPREHENSIVE METABOLIC PANEL
ALT: 37 U/L — AB (ref 0–35)
AST: 33 U/L (ref 0–37)
Albumin: 4.6 g/dL (ref 3.5–5.2)
Alkaline Phosphatase: 58 U/L (ref 39–117)
BUN: 20 mg/dL (ref 6–23)
CALCIUM: 9.7 mg/dL (ref 8.4–10.5)
CHLORIDE: 98 meq/L (ref 96–112)
CO2: 29 meq/L (ref 19–32)
CREATININE: 0.7 mg/dL (ref 0.4–1.2)
GFR: 97.18 mL/min (ref >60.00)
Glucose, Bld: 87 mg/dL (ref 70–99)
Potassium: 3.3 mEq/L — ABNORMAL LOW (ref 3.5–5.1)
Sodium: 137 mEq/L (ref 135–145)
Total Bilirubin: 1 mg/dL (ref 0.3–1.2)
Total Protein: 8.4 g/dL — ABNORMAL HIGH (ref 6.0–8.3)

## 2013-07-28 LAB — CBC WITH DIFFERENTIAL/PLATELET
BASOS PCT: 0.4 % (ref 0.0–3.0)
Basophils Absolute: 0 10*3/uL (ref 0.0–0.1)
EOS PCT: 1.6 % (ref 0.0–5.0)
Eosinophils Absolute: 0.1 10*3/uL (ref 0.0–0.7)
HCT: 42.6 % (ref 36.0–46.0)
HEMOGLOBIN: 14.6 g/dL (ref 12.0–15.0)
LYMPHS PCT: 15.6 % (ref 12.0–46.0)
Lymphs Abs: 1 10*3/uL (ref 0.7–4.0)
MCHC: 34.1 g/dL (ref 30.0–36.0)
MCV: 93.8 fl (ref 78.0–100.0)
MONOS PCT: 5.2 % (ref 3.0–12.0)
Monocytes Absolute: 0.3 10*3/uL (ref 0.1–1.0)
NEUTROS ABS: 5.1 10*3/uL (ref 1.4–7.7)
Neutrophils Relative %: 77.2 % — ABNORMAL HIGH (ref 43.0–77.0)
Platelets: 257 10*3/uL (ref 150.0–400.0)
RBC: 4.54 Mil/uL (ref 3.87–5.11)
RDW: 14.2 % (ref 11.5–14.6)
WBC: 6.6 10*3/uL (ref 4.5–10.5)

## 2013-07-28 LAB — BRAIN NATRIURETIC PEPTIDE: Pro B Natriuretic peptide (BNP): 66 pg/mL (ref 0.0–100.0)

## 2013-07-28 LAB — POCT INR: INR: 3.4

## 2013-07-28 NOTE — Addendum Note (Signed)
Addended by: Elmer Picker on: 07/28/2013 09:14 AM   Modules accepted: Orders

## 2013-07-28 NOTE — Progress Notes (Signed)
Pre-visit discussion using our clinic review tool. No additional management support is needed unless otherwise documented below in the visit note.  

## 2013-07-29 LAB — HEPATITIS B SURFACE ANTIGEN: Hepatitis B Surface Ag: NEGATIVE

## 2013-07-29 LAB — HEPATITIS A ANTIBODY, TOTAL: Hep A Total Ab: NONREACTIVE

## 2013-07-30 LAB — HEPATITIS C RNA QUANTITATIVE: HCV Quantitative: NOT DETECTED IU/mL (ref <15)

## 2013-07-31 LAB — HEPATITIS C GENOTYPE

## 2013-08-01 ENCOUNTER — Other Ambulatory Visit: Payer: Self-pay | Admitting: *Deleted

## 2013-08-01 ENCOUNTER — Other Ambulatory Visit: Payer: Self-pay | Admitting: Internal Medicine

## 2013-08-01 ENCOUNTER — Other Ambulatory Visit: Payer: Self-pay | Admitting: Family

## 2013-08-01 ENCOUNTER — Telehealth: Payer: Self-pay | Admitting: Internal Medicine

## 2013-08-01 DIAGNOSIS — R5383 Other fatigue: Secondary | ICD-10-CM

## 2013-08-01 NOTE — Telephone Encounter (Signed)
Patient called.  Going to [place] later this month They had recommended that she hav TSH, free T3, Free T4 drawn given fatigue. Patient would like to have done at [facility].

## 2013-08-01 NOTE — Telephone Encounter (Signed)
Orders placed.

## 2013-08-04 ENCOUNTER — Other Ambulatory Visit: Payer: Self-pay | Admitting: General Practice

## 2013-08-04 ENCOUNTER — Telehealth: Payer: Self-pay | Admitting: Gynecology

## 2013-08-04 MED ORDER — WARFARIN SODIUM 4 MG PO TABS: ORAL_TABLET | ORAL | Status: DC

## 2013-08-04 NOTE — Telephone Encounter (Signed)
Pt would like get tested for menopause.

## 2013-08-04 NOTE — Telephone Encounter (Signed)
If pt is on ocp, her hormones are stable

## 2013-08-04 NOTE — Telephone Encounter (Signed)
Spoke with pt who has a hx of congenital heart defect. Pt has an arrythmia and is being treated with Betapace. Pt says arrythmia seems to be worse in the last year and a half. Pt sees her cardiologist next week in [place]. Pt has been having hot and cold spells and has night sweats 3-4 nights out of 7, but states this is ongoing. Pt states she is not having any new GYN problems since last visit with TL. Pt plans to have thyroid checked at [facility] at some point this week and is wondering if TL would be willing to order labs to check hormone levels at the same time. Pt sees Dr. Harrington Challenger and Dr. Arnoldo Morale there. Pt will come in to see TL if needed, but again, no new GYN problems since last visit. Pt thinks the "hot spells" are related to her arrythmia, and wants to rule out thyroid or hormonal causes. Pt states her mom went through menopause at age 38. Please advise regarding labwork or need for OV.

## 2013-08-05 NOTE — Telephone Encounter (Signed)
Pt notified that her hormones are stable due to OCP, so no need for testing. Pt will pass this along to her cardiologist and is appreciative.

## 2013-08-05 NOTE — Telephone Encounter (Signed)
LMTCB

## 2013-08-07 ENCOUNTER — Other Ambulatory Visit (INDEPENDENT_AMBULATORY_CARE_PROVIDER_SITE_OTHER): Payer: 59

## 2013-08-07 DIAGNOSIS — R5383 Other fatigue: Secondary | ICD-10-CM

## 2013-08-07 DIAGNOSIS — R5381 Other malaise: Secondary | ICD-10-CM

## 2013-08-07 LAB — T4, FREE: FREE T4: 0.77 ng/dL (ref 0.60–1.60)

## 2013-08-07 LAB — T3, FREE: T3, Free: 2.8 pg/mL (ref 2.3–4.2)

## 2013-08-07 LAB — TSH: TSH: 1.82 u[IU]/mL (ref 0.35–5.50)

## 2013-08-25 ENCOUNTER — Ambulatory Visit: Payer: 59

## 2013-09-01 ENCOUNTER — Ambulatory Visit (INDEPENDENT_AMBULATORY_CARE_PROVIDER_SITE_OTHER): Payer: 59 | Admitting: General Practice

## 2013-09-01 DIAGNOSIS — D6859 Other primary thrombophilia: Secondary | ICD-10-CM

## 2013-09-01 DIAGNOSIS — I059 Rheumatic mitral valve disease, unspecified: Secondary | ICD-10-CM

## 2013-09-01 DIAGNOSIS — I359 Nonrheumatic aortic valve disorder, unspecified: Secondary | ICD-10-CM

## 2013-09-01 DIAGNOSIS — Z5181 Encounter for therapeutic drug level monitoring: Secondary | ICD-10-CM | POA: Insufficient documentation

## 2013-09-01 DIAGNOSIS — I824Y9 Acute embolism and thrombosis of unspecified deep veins of unspecified proximal lower extremity: Secondary | ICD-10-CM

## 2013-09-01 DIAGNOSIS — Z954 Presence of other heart-valve replacement: Secondary | ICD-10-CM

## 2013-09-01 DIAGNOSIS — I4891 Unspecified atrial fibrillation: Secondary | ICD-10-CM

## 2013-09-01 LAB — POCT INR: INR: 2.7

## 2013-09-01 NOTE — Progress Notes (Signed)
Pre-visit discussion using our clinic review tool. No additional management support is needed unless otherwise documented below in the visit note.  

## 2013-09-03 ENCOUNTER — Other Ambulatory Visit: Payer: Self-pay | Admitting: Internal Medicine

## 2013-09-21 HISTORY — PX: CARDIAC ELECTROPHYSIOLOGY STUDY AND ABLATION: SHX1294

## 2013-09-22 ENCOUNTER — Ambulatory Visit (INDEPENDENT_AMBULATORY_CARE_PROVIDER_SITE_OTHER): Payer: 59 | Admitting: Family

## 2013-09-22 DIAGNOSIS — Z5181 Encounter for therapeutic drug level monitoring: Secondary | ICD-10-CM

## 2013-09-22 DIAGNOSIS — I059 Rheumatic mitral valve disease, unspecified: Secondary | ICD-10-CM

## 2013-09-22 DIAGNOSIS — I824Y9 Acute embolism and thrombosis of unspecified deep veins of unspecified proximal lower extremity: Secondary | ICD-10-CM

## 2013-09-22 DIAGNOSIS — I4891 Unspecified atrial fibrillation: Secondary | ICD-10-CM

## 2013-09-22 DIAGNOSIS — D6859 Other primary thrombophilia: Secondary | ICD-10-CM

## 2013-09-22 DIAGNOSIS — Z954 Presence of other heart-valve replacement: Secondary | ICD-10-CM

## 2013-09-22 DIAGNOSIS — I359 Nonrheumatic aortic valve disorder, unspecified: Secondary | ICD-10-CM

## 2013-09-22 LAB — POCT INR: INR: 3.6

## 2013-09-22 NOTE — Progress Notes (Signed)
Pre visit review using our clinic review tool, if applicable. No additional management support is needed unless otherwise documented below in the visit note. 

## 2013-09-22 NOTE — Patient Instructions (Signed)
Eat some greens then continue Take 1 1/2 tablet on M/W/F and 1 tablet all other days.  Re-check in 1 week.  Anticoagulation Dose Instructions as of 09/22/2013     Jessica [name] Tue Wed Thu Fri Sat   New Dose 4 mg 6 mg 4 mg 6 mg 4 mg 6 mg 4 mg    Description       Eat some greens then continue Take 1 1/2 tablet on M/W/F and 1 tablet all other days.  Re-check in 1 week.

## 2013-09-29 ENCOUNTER — Ambulatory Visit (INDEPENDENT_AMBULATORY_CARE_PROVIDER_SITE_OTHER): Payer: 59 | Admitting: General Practice

## 2013-09-29 DIAGNOSIS — Z954 Presence of other heart-valve replacement: Secondary | ICD-10-CM

## 2013-09-29 DIAGNOSIS — I359 Nonrheumatic aortic valve disorder, unspecified: Secondary | ICD-10-CM

## 2013-09-29 DIAGNOSIS — I059 Rheumatic mitral valve disease, unspecified: Secondary | ICD-10-CM

## 2013-09-29 DIAGNOSIS — D6859 Other primary thrombophilia: Secondary | ICD-10-CM

## 2013-09-29 DIAGNOSIS — I4891 Unspecified atrial fibrillation: Secondary | ICD-10-CM

## 2013-09-29 LAB — POCT INR: INR: 4

## 2013-09-29 NOTE — Progress Notes (Signed)
Pre visit review using our clinic review tool, if applicable. No additional management support is needed unless otherwise documented below in the visit note. 

## 2013-10-05 ENCOUNTER — Other Ambulatory Visit: Payer: Self-pay | Admitting: Internal Medicine

## 2013-10-06 ENCOUNTER — Ambulatory Visit (INDEPENDENT_AMBULATORY_CARE_PROVIDER_SITE_OTHER): Payer: 59 | Admitting: General Practice

## 2013-10-06 DIAGNOSIS — D6859 Other primary thrombophilia: Secondary | ICD-10-CM

## 2013-10-06 DIAGNOSIS — Z5181 Encounter for therapeutic drug level monitoring: Secondary | ICD-10-CM

## 2013-10-06 DIAGNOSIS — I059 Rheumatic mitral valve disease, unspecified: Secondary | ICD-10-CM

## 2013-10-06 DIAGNOSIS — I4891 Unspecified atrial fibrillation: Secondary | ICD-10-CM

## 2013-10-06 DIAGNOSIS — I824Y9 Acute embolism and thrombosis of unspecified deep veins of unspecified proximal lower extremity: Secondary | ICD-10-CM

## 2013-10-06 DIAGNOSIS — I359 Nonrheumatic aortic valve disorder, unspecified: Secondary | ICD-10-CM

## 2013-10-06 DIAGNOSIS — Z954 Presence of other heart-valve replacement: Secondary | ICD-10-CM

## 2013-10-06 LAB — POCT INR: INR: 2.7

## 2013-10-06 NOTE — Progress Notes (Signed)
Pre visit review using our clinic review tool, if applicable. No additional management support is needed unless otherwise documented below in the visit note. 

## 2013-10-13 ENCOUNTER — Ambulatory Visit (INDEPENDENT_AMBULATORY_CARE_PROVIDER_SITE_OTHER): Payer: 59 | Admitting: General Practice

## 2013-10-13 ENCOUNTER — Ambulatory Visit: Payer: 59

## 2013-10-13 DIAGNOSIS — Z5181 Encounter for therapeutic drug level monitoring: Secondary | ICD-10-CM

## 2013-10-13 DIAGNOSIS — I359 Nonrheumatic aortic valve disorder, unspecified: Secondary | ICD-10-CM

## 2013-10-13 DIAGNOSIS — D6859 Other primary thrombophilia: Secondary | ICD-10-CM

## 2013-10-13 DIAGNOSIS — I824Y9 Acute embolism and thrombosis of unspecified deep veins of unspecified proximal lower extremity: Secondary | ICD-10-CM

## 2013-10-13 DIAGNOSIS — Z954 Presence of other heart-valve replacement: Secondary | ICD-10-CM

## 2013-10-13 DIAGNOSIS — I4891 Unspecified atrial fibrillation: Secondary | ICD-10-CM

## 2013-10-13 DIAGNOSIS — I059 Rheumatic mitral valve disease, unspecified: Secondary | ICD-10-CM

## 2013-10-13 LAB — POCT INR: INR: 3.5

## 2013-10-13 NOTE — Progress Notes (Signed)
Pre visit review using our clinic review tool, if applicable. No additional management support is needed unless otherwise documented below in the visit note. 

## 2013-10-21 ENCOUNTER — Telehealth: Payer: Self-pay | Admitting: Internal Medicine

## 2013-10-21 NOTE — Telephone Encounter (Signed)
New message  Patient had an ablation done last week by Dr Jarrett Ables and would like to speak with you. Please call and advise.

## 2013-10-21 NOTE — Telephone Encounter (Signed)
Patient only prefers to speak with Dr. Harrington Challenger in regard to ablation last week at outside facility.  She denies having any issues or complications post procedure. Wants to discuss with Dr. Harrington Challenger. Advised that Dr. Harrington Challenger may not be available to return call right away.  Patient ok with this, the call is not urgent.

## 2013-10-22 ENCOUNTER — Other Ambulatory Visit: Payer: Self-pay | Admitting: Internal Medicine

## 2013-10-23 NOTE — Telephone Encounter (Signed)
Spoke with pt, Follow up scheduled  

## 2013-10-23 NOTE — Telephone Encounter (Signed)
Spoke to patinet re ablation   She will need f/u appt with me in 5 wks.

## 2013-10-24 ENCOUNTER — Other Ambulatory Visit: Payer: Self-pay | Admitting: *Deleted

## 2013-10-24 MED ORDER — HYDROCHLOROTHIAZIDE 50 MG PO TABS: 50.0000 mg | ORAL_TABLET | Freq: Every day | ORAL | Status: DC

## 2013-10-27 ENCOUNTER — Ambulatory Visit (INDEPENDENT_AMBULATORY_CARE_PROVIDER_SITE_OTHER): Payer: 59 | Admitting: General Practice

## 2013-10-27 DIAGNOSIS — I059 Rheumatic mitral valve disease, unspecified: Secondary | ICD-10-CM

## 2013-10-27 DIAGNOSIS — Z954 Presence of other heart-valve replacement: Secondary | ICD-10-CM

## 2013-10-27 DIAGNOSIS — I4891 Unspecified atrial fibrillation: Secondary | ICD-10-CM

## 2013-10-27 DIAGNOSIS — I359 Nonrheumatic aortic valve disorder, unspecified: Secondary | ICD-10-CM

## 2013-10-27 DIAGNOSIS — D6859 Other primary thrombophilia: Secondary | ICD-10-CM

## 2013-10-27 DIAGNOSIS — I824Y9 Acute embolism and thrombosis of unspecified deep veins of unspecified proximal lower extremity: Secondary | ICD-10-CM

## 2013-10-27 DIAGNOSIS — Z5181 Encounter for therapeutic drug level monitoring: Secondary | ICD-10-CM

## 2013-10-27 LAB — POCT INR: INR: 2.8

## 2013-10-27 NOTE — Progress Notes (Signed)
Pre visit review using our clinic review tool, if applicable. No additional management support is needed unless otherwise documented below in the visit note. 

## 2013-11-03 ENCOUNTER — Other Ambulatory Visit: Payer: Self-pay | Admitting: Internal Medicine

## 2013-11-10 ENCOUNTER — Ambulatory Visit (INDEPENDENT_AMBULATORY_CARE_PROVIDER_SITE_OTHER): Payer: 59 | Admitting: General Practice

## 2013-11-10 ENCOUNTER — Ambulatory Visit: Payer: 59

## 2013-11-10 DIAGNOSIS — D6859 Other primary thrombophilia: Secondary | ICD-10-CM

## 2013-11-10 DIAGNOSIS — I824Y9 Acute embolism and thrombosis of unspecified deep veins of unspecified proximal lower extremity: Secondary | ICD-10-CM

## 2013-11-10 DIAGNOSIS — I359 Nonrheumatic aortic valve disorder, unspecified: Secondary | ICD-10-CM

## 2013-11-10 DIAGNOSIS — Z5181 Encounter for therapeutic drug level monitoring: Secondary | ICD-10-CM

## 2013-11-10 DIAGNOSIS — Z954 Presence of other heart-valve replacement: Secondary | ICD-10-CM

## 2013-11-10 DIAGNOSIS — I4891 Unspecified atrial fibrillation: Secondary | ICD-10-CM

## 2013-11-10 DIAGNOSIS — I059 Rheumatic mitral valve disease, unspecified: Secondary | ICD-10-CM

## 2013-11-10 LAB — POCT INR: INR: 2.9

## 2013-11-10 NOTE — Progress Notes (Signed)
Pre visit review using our clinic review tool, if applicable. No additional management support is needed unless otherwise documented below in the visit note. 

## 2013-11-12 ENCOUNTER — Other Ambulatory Visit: Payer: Self-pay | Admitting: *Deleted

## 2013-11-12 DIAGNOSIS — R0602 Shortness of breath: Secondary | ICD-10-CM

## 2013-11-13 ENCOUNTER — Telehealth: Payer: Self-pay | Admitting: Internal Medicine

## 2013-11-13 NOTE — Telephone Encounter (Signed)
Returned call to patient she stated Dr.Ross wanted her to have a stress test for sob.Advised will have to ask Dr.Ross what type of stress test.Message sent to Dr.Ross for a order.

## 2013-11-13 NOTE — Telephone Encounter (Signed)
New Message  Pt called to schedule stress test. No orders on file// Please assist

## 2013-11-14 ENCOUNTER — Encounter: Payer: Self-pay | Admitting: *Deleted

## 2013-11-14 DIAGNOSIS — R0602 Shortness of breath: Secondary | ICD-10-CM

## 2013-11-14 NOTE — Telephone Encounter (Signed)
Discussed with dr Harrington Challenger, pt instructed to take her sotalol.

## 2013-11-14 NOTE — Telephone Encounter (Signed)
Pt is schedule for GXT. Will find out from dr Harrington Challenger if pt needs to hold sotalol.

## 2013-11-14 NOTE — Progress Notes (Signed)
This encounter was created in error - please disregard.

## 2013-11-17 ENCOUNTER — Encounter: Payer: Self-pay | Admitting: Internal Medicine

## 2013-11-17 DIAGNOSIS — F329 Major depressive disorder, single episode, unspecified: Secondary | ICD-10-CM

## 2013-11-17 DIAGNOSIS — F32A Depression, unspecified: Secondary | ICD-10-CM

## 2013-11-18 ENCOUNTER — Other Ambulatory Visit (HOSPITAL_COMMUNITY): Payer: Self-pay | Admitting: Internal Medicine

## 2013-11-18 ENCOUNTER — Ambulatory Visit (HOSPITAL_COMMUNITY)
Admission: RE | Admit: 2013-11-18 | Discharge: 2013-11-18 | Disposition: A | Discharge status: Home / Self Care | Payer: 59 | Source: Ambulatory Visit | Attending: Cardiovascular Disease | Admitting: Cardiovascular Disease

## 2013-11-18 DIAGNOSIS — R0602 Shortness of breath: Secondary | ICD-10-CM

## 2013-11-24 ENCOUNTER — Encounter: Payer: Self-pay | Admitting: Internal Medicine

## 2013-11-24 ENCOUNTER — Ambulatory Visit (INDEPENDENT_AMBULATORY_CARE_PROVIDER_SITE_OTHER): Payer: 59 | Admitting: Internal Medicine

## 2013-11-24 VITALS — BP 100/62 | HR 57 | Ht 64.75 in | Wt 142.8 lb

## 2013-11-24 DIAGNOSIS — I359 Nonrheumatic aortic valve disorder, unspecified: Secondary | ICD-10-CM

## 2013-11-24 LAB — BASIC METABOLIC PANEL
BUN: 18 mg/dL (ref 6–23)
CHLORIDE: 94 meq/L — AB (ref 96–112)
CO2: 30 meq/L (ref 19–32)
CREATININE: 0.8 mg/dL (ref 0.4–1.2)
Calcium: 9.8 mg/dL (ref 8.4–10.5)
GFR: 82.33 mL/min (ref >60.00)
GLUCOSE: 87 mg/dL (ref 70–99)
Potassium: 3.8 mEq/L (ref 3.5–5.1)
Sodium: 136 mEq/L (ref 135–145)

## 2013-11-24 MED ORDER — SOTALOL HCL 160 MG PO TABS: ORAL_TABLET | ORAL | Status: DC

## 2013-11-24 NOTE — Progress Notes (Signed)
HPI Patient is a 38 year old with a history of AV canal defect s/p repair in early life. In May 1991 she underwent modified Konno operation with a 25 mm St Jude valve in the aortic position. In 1995 she had a 23 mm Carbomedics valve placed in the mitral position for a partially obstructed valve. 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 All surgeries were done at [facility]. In December 2012 she underwent MV replacement (3rd) with St Jude prosthesis. She also had TV reconstruction, MAZE procedure, isthmus ablation and reconstruction of her tricuspid valve.. Also had repair of ascending aortic tear. I last saw her in clinic in early March. She had an ablation of atrial flutter in March 2013 by Dr Jarrett Ables  Later in the spring she had problems with palpitations. Holter showed atrial ectopy, possible short bursts of atrial tach. After discussion with Dr. Jarrett Ables patient was started on Sotalol 160 bid Ths was decreased to 80 bid due to fatigue.  She had recurrent atrial flutter in the fall  Increased sotalol and she converted to SR.  Since I saw her last she was seen at [facility] and underwent ablation earlier this winter.  She called a few wks ago and said that she had an episode while sitting in bed of her heart racing Lasted a few min.  Since then had one other spell  She is frustrated  Says she gets fatigued easily  W[place] got SOB She recently underwent exercising testing  She went 6 min 46 sec to a peak HR of 123 bpm  Peak BP 153/104.  Stopped bycase of dyspnea.      Allergies  Allergen Reactions  . Betadine [Povidone Iodine] Rash  . Povidone-Iodine Other (See Comments)    Like sunburn. Really bad rash. Do not use for cath procedures    Current Outpatient Prescriptions  Medication Sig Dispense Refill  . aspirin 81 MG tablet Take 81 mg by mouth daily.        . DULoxetine (CYMBALTA) 30 MG capsule TAKE 1 CAPSULE BY MOUTH DAILY.  90 capsule  3  . furosemide (LASIX) 40  MG tablet TAKE 1 TABLET BY MOUTH EVERY DAY  30 tablet  0  . hydrochlorothiazide (HYDRODIURIL) 50 MG tablet Take 1 tablet (50 mg total) by mouth daily.  30 tablet  12  . norethindrone-ethinyl estradiol 1/35 (NECON 1/35, 28,) tablet Take 1 tablet by mouth daily. No placebos  4 Package  3  . ondansetron (ZOFRAN) 4 MG tablet Take 1 tablet (4 mg total) by mouth every 8 (eight) hours as needed for nausea.  20 tablet  0  . potassium chloride (KLOR-CON 10) 10 MEQ tablet TAKE 2 TABLETS BY MOUTH TWICE DAILY  360 tablet  3  . sotalol (BETAPACE) 160 MG tablet Take 80 mg by mouth 2 (two) times daily. And alternate 80mg  and 120mg  every other night      . spironolactone (ALDACTONE) 50 MG tablet TAKE 1 TABLET BY MOUTH EVERY DAY  90 tablet  3  . warfarin (COUMADIN) 4 MG tablet TAKE ONE TABLET BY MOUTH EVERY DAY EXCEPT TUESDAYS AND THURSDAYS, TAKE 6 MG  34 tablet  0  . zaleplon (SONATA) 10 MG capsule Take 10 mg by mouth at bedtime as needed. For sleep       No current facility-administered medications for this visit.    Past Medical History  Diagnosis Date  . Atrioventricular canal defect  congenital - multiple surgeries  . Migraine   . Depression   . Hepatitis C   . Atrioventricular canal (AVC), complete     Has had 7 sternotomies for repair of this  . Acute on chronic heart failure     periprosthetic mitral valve leak  . H/O mitral valve replacement     a. x 3 -  most recent 06/2011 - St. Jude MVR  . History of aortic valve replacement     a. 6mm SJM 1995  . H/O maze procedure     06/2011  . History of tricuspid valve repair     06/2011  . Cancer 06/2011  . Dyspareunia   . HPV (human papilloma virus) infection 2006    Past Surgical History  Procedure Laterality Date  . Aorto ventricular canal defect repair      age 64 mths  . Mitral valve replacement  06/2011    Third mitral valve replacement  . Aortic valve replacement    . Sternotomy      She has had 7 sternotomy  . Cardioversion   08/17/2011    Procedure: CARDIOVERSION;  Surgeon: Carlena Bjornstad, MD;  Location: [place];  Service: Cardiovascular;  Laterality: N/A;  . Cardioversion  09/11/2011    Procedure: CARDIOVERSION;  Surgeon: Fay Records, MD;  Location: [place];  Service: Cardiovascular;  Laterality: N/A;  Carioversion  . Cardioversion  09/29/2011    Procedure: CARDIOVERSION;  Surgeon: Thayer Headings, MD;  Location: [facility] OR;  Service: Cardiovascular;  Laterality: N/A;  . Wisdom tooth extraction  16 years ago    Family History  Problem Relation Age of Onset  . Colon cancer    . Colon polyps    . Hyperlipidemia Mother   . Depression Mother   . Thyroid disease Mother   . Hyperlipidemia Father   . Hypertension Father   . Thyroid disease Father   . Colon cancer Paternal Grandfather     History   Social History  . Marital Status: Single    Spouse Name: N/A    Number of Children: N/A  . Years of Education: N/A   Occupational History  . legal specialist    Social History Main Topics  . Smoking status: Never Smoker   . Smokeless tobacco: Never Used  . Alcohol Use: Yes     Comment: occasionally  . Drug Use: No  . Sexual Activity: Yes   Other Topics Concern  . Not on file   Social History Narrative  . No narrative on file    Review of Systems:  All systems reviewed.  They are negative to the above problem except as previously stated.  Vital Signs: BP 100/62  Pulse 57  Ht 5' 4.75" (1.645 m)  Wt 142 lb 12.8 oz (64.774 kg)  BMI 23.94 kg/m2  SpO2 98%  Physical Exam Patient is in NAD HEENT:  Normocephalic, atraumatic. EOMI, PERRLA.  Neck: JVP is normal.  No bruits.  Lungs: clear to auscultation. No rales no wheezes.  Heart: Regular rate and rhythm. Normal S1 S2.  Crisp AV sounds.  Gr I-II systolic murmur base  Gr II/VI diastolic murmur LSB.  Abdomen:  Supple, nontender. Normal bowel sounds  Mildly bloated.  No hepatomegaly.    Extremities:   Good distal pulses throughout. No lower extremity edema.   Musculoskeletal :moving all extremities.  Neuro:   alert and oriented x3.  CN II-XII grossly intact.  EKG  SB with atrial bigeminy  58  bpm  RBBB  LAFB.   Assessment and Plan:  1 Dyspnea  Patient is frustrated by dyspnea, fatigue  Recent stress test she reaches peak HR in 120s (ST)  Prob from sotalol Her exercise limitation may be due to some deconditioning.  She also had a signif elevation of her BP  Some of symptoms may reflect diastolic dysfunciton.  Recomm that she increase exercise.  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 She wants to decrease the sotalol as well to see how she feels  May have more arrhythmia  Still, will follow If has recurrent palpitations will set up with event monitor  She will call with response

## 2013-11-24 NOTE — Patient Instructions (Signed)
Your physician recommends that you schedule a follow-up appointment in: TBD  CUT SOTALOL TO 40 MG TWICE DAILY= 1/4 OF 160 MG TABLET TWICE DAILY  CALL IF PALPITATIONS CONTINUE TO SCHEDULE EVENT MONITOR

## 2013-12-08 ENCOUNTER — Ambulatory Visit: Payer: 59

## 2013-12-10 ENCOUNTER — Other Ambulatory Visit: Payer: Self-pay | Admitting: Internal Medicine

## 2013-12-11 ENCOUNTER — Ambulatory Visit (INDEPENDENT_AMBULATORY_CARE_PROVIDER_SITE_OTHER): Payer: 59 | Admitting: General Practice

## 2013-12-11 ENCOUNTER — Other Ambulatory Visit: Payer: Self-pay | Admitting: Internal Medicine

## 2013-12-11 DIAGNOSIS — Z954 Presence of other heart-valve replacement: Secondary | ICD-10-CM

## 2013-12-11 DIAGNOSIS — I059 Rheumatic mitral valve disease, unspecified: Secondary | ICD-10-CM

## 2013-12-11 DIAGNOSIS — I4891 Unspecified atrial fibrillation: Secondary | ICD-10-CM

## 2013-12-11 DIAGNOSIS — I824Y9 Acute embolism and thrombosis of unspecified deep veins of unspecified proximal lower extremity: Secondary | ICD-10-CM

## 2013-12-11 DIAGNOSIS — D6859 Other primary thrombophilia: Secondary | ICD-10-CM

## 2013-12-11 DIAGNOSIS — Z5181 Encounter for therapeutic drug level monitoring: Secondary | ICD-10-CM

## 2013-12-11 DIAGNOSIS — I359 Nonrheumatic aortic valve disorder, unspecified: Secondary | ICD-10-CM

## 2013-12-11 LAB — POCT INR: INR: 2.7

## 2013-12-11 NOTE — Progress Notes (Signed)
Pre visit review using our clinic review tool, if applicable. No additional management support is needed unless otherwise documented below in the visit note. 

## 2013-12-27 ENCOUNTER — Other Ambulatory Visit: Payer: Self-pay | Admitting: Internal Medicine

## 2014-01-01 ENCOUNTER — Ambulatory Visit: Payer: 59 | Admitting: Psychology

## 2014-01-05 ENCOUNTER — Encounter: Payer: Self-pay | Admitting: Internal Medicine

## 2014-01-06 ENCOUNTER — Other Ambulatory Visit: Payer: Self-pay | Admitting: *Deleted

## 2014-01-06 DIAGNOSIS — Z5181 Encounter for therapeutic drug level monitoring: Secondary | ICD-10-CM

## 2014-01-06 DIAGNOSIS — I4891 Unspecified atrial fibrillation: Secondary | ICD-10-CM

## 2014-01-09 ENCOUNTER — Ambulatory Visit (INDEPENDENT_AMBULATORY_CARE_PROVIDER_SITE_OTHER): Payer: 59 | Admitting: *Deleted

## 2014-01-09 ENCOUNTER — Other Ambulatory Visit: Payer: 59

## 2014-01-09 VITALS — BP 90/60 | HR 80 | Resp 18 | Wt 141.0 lb

## 2014-01-09 DIAGNOSIS — I359 Nonrheumatic aortic valve disorder, unspecified: Secondary | ICD-10-CM

## 2014-01-09 DIAGNOSIS — I480 Paroxysmal atrial fibrillation: Secondary | ICD-10-CM

## 2014-01-09 DIAGNOSIS — Z5181 Encounter for therapeutic drug level monitoring: Secondary | ICD-10-CM

## 2014-01-09 DIAGNOSIS — I4891 Unspecified atrial fibrillation: Secondary | ICD-10-CM

## 2014-01-09 LAB — BASIC METABOLIC PANEL
BUN: 13 mg/dL (ref 6–23)
CO2: 32 meq/L (ref 19–32)
Calcium: 9.5 mg/dL (ref 8.4–10.5)
Chloride: 95 mEq/L — ABNORMAL LOW (ref 96–112)
Creatinine, Ser: 0.6 mg/dL (ref 0.4–1.2)
GFR: 111.05 mL/min (ref >60.00)
GLUCOSE: 80 mg/dL (ref 70–99)
Potassium: 3.1 mEq/L — ABNORMAL LOW (ref 3.5–5.1)
SODIUM: 138 meq/L (ref 135–145)

## 2014-01-09 LAB — MAGNESIUM: Magnesium: 1.8 mg/dL (ref 1.5–2.5)

## 2014-01-09 NOTE — Progress Notes (Signed)
1.) Reason for visit: EKG and BP check  2.) Name of MD requesting visit: Ross  3.) H&P: atriventricular canal defect,MVR,AVR  4.) ROS related to problem: patients sotalol was stopped at the last office visit. She denies SOB, chest pain or palpitations.             Will hold EKG for dr Harrington Challenger review.  5.) Assessment and plan per MD: EKG given to dr Harrington Challenger for her review.

## 2014-01-12 ENCOUNTER — Other Ambulatory Visit: Payer: Self-pay | Admitting: Gynecology

## 2014-01-12 NOTE — Telephone Encounter (Signed)
Last AEX 02/18/13 #4 Packs with 3 refills was sent to pharmacy AEX scheduled for 02/20/14 with Dr. Richardson Chiquito 1/35 #112 tablets (4 packs) with no refills sent to pharmacy to last patient until AEX

## 2014-01-16 ENCOUNTER — Ambulatory Visit (INDEPENDENT_AMBULATORY_CARE_PROVIDER_SITE_OTHER): Payer: 59 | Admitting: General Practice

## 2014-01-16 DIAGNOSIS — I059 Rheumatic mitral valve disease, unspecified: Secondary | ICD-10-CM

## 2014-01-16 DIAGNOSIS — D6859 Other primary thrombophilia: Secondary | ICD-10-CM

## 2014-01-16 DIAGNOSIS — I359 Nonrheumatic aortic valve disorder, unspecified: Secondary | ICD-10-CM

## 2014-01-16 DIAGNOSIS — Z954 Presence of other heart-valve replacement: Secondary | ICD-10-CM

## 2014-01-16 DIAGNOSIS — I4891 Unspecified atrial fibrillation: Secondary | ICD-10-CM

## 2014-01-16 DIAGNOSIS — Z5181 Encounter for therapeutic drug level monitoring: Secondary | ICD-10-CM

## 2014-01-16 LAB — POCT INR: INR: 3

## 2014-01-16 NOTE — Progress Notes (Signed)
Pre visit review using our clinic review tool, if applicable. No additional management support is needed unless otherwise documented below in the visit note. 

## 2014-01-22 ENCOUNTER — Ambulatory Visit (INDEPENDENT_AMBULATORY_CARE_PROVIDER_SITE_OTHER): Payer: 59 | Admitting: General Practice

## 2014-01-22 DIAGNOSIS — I4891 Unspecified atrial fibrillation: Secondary | ICD-10-CM

## 2014-01-22 DIAGNOSIS — I059 Rheumatic mitral valve disease, unspecified: Secondary | ICD-10-CM

## 2014-01-22 DIAGNOSIS — Z5181 Encounter for therapeutic drug level monitoring: Secondary | ICD-10-CM

## 2014-01-22 DIAGNOSIS — I359 Nonrheumatic aortic valve disorder, unspecified: Secondary | ICD-10-CM

## 2014-01-22 DIAGNOSIS — Z954 Presence of other heart-valve replacement: Secondary | ICD-10-CM

## 2014-01-22 DIAGNOSIS — D6859 Other primary thrombophilia: Secondary | ICD-10-CM

## 2014-01-22 LAB — POCT INR: INR: 2.6

## 2014-01-22 NOTE — Progress Notes (Signed)
Pre visit review using our clinic review tool, if applicable. No additional management support is needed unless otherwise documented below in the visit note. 

## 2014-01-29 ENCOUNTER — Ambulatory Visit (INDEPENDENT_AMBULATORY_CARE_PROVIDER_SITE_OTHER): Payer: 59 | Admitting: General Practice

## 2014-01-29 DIAGNOSIS — Z954 Presence of other heart-valve replacement: Secondary | ICD-10-CM

## 2014-01-29 DIAGNOSIS — I4891 Unspecified atrial fibrillation: Secondary | ICD-10-CM

## 2014-01-29 DIAGNOSIS — I824Y1 Acute embolism and thrombosis of unspecified deep veins of right proximal lower extremity: Secondary | ICD-10-CM

## 2014-01-29 DIAGNOSIS — D6859 Other primary thrombophilia: Secondary | ICD-10-CM

## 2014-01-29 DIAGNOSIS — Z5181 Encounter for therapeutic drug level monitoring: Secondary | ICD-10-CM

## 2014-01-29 DIAGNOSIS — I359 Nonrheumatic aortic valve disorder, unspecified: Secondary | ICD-10-CM

## 2014-01-29 DIAGNOSIS — I059 Rheumatic mitral valve disease, unspecified: Secondary | ICD-10-CM

## 2014-01-29 DIAGNOSIS — I824Y9 Acute embolism and thrombosis of unspecified deep veins of unspecified proximal lower extremity: Secondary | ICD-10-CM

## 2014-01-29 LAB — POCT INR: INR: 3.2

## 2014-01-29 NOTE — Progress Notes (Signed)
Pre visit review using our clinic review tool, if applicable. No additional management support is needed unless otherwise documented below in the visit note. 

## 2014-02-06 ENCOUNTER — Ambulatory Visit (INDEPENDENT_AMBULATORY_CARE_PROVIDER_SITE_OTHER): Payer: 59 | Admitting: General Practice

## 2014-02-06 DIAGNOSIS — D6859 Other primary thrombophilia: Secondary | ICD-10-CM

## 2014-02-06 DIAGNOSIS — Z5181 Encounter for therapeutic drug level monitoring: Secondary | ICD-10-CM

## 2014-02-06 DIAGNOSIS — I059 Rheumatic mitral valve disease, unspecified: Secondary | ICD-10-CM

## 2014-02-06 DIAGNOSIS — I359 Nonrheumatic aortic valve disorder, unspecified: Secondary | ICD-10-CM

## 2014-02-06 DIAGNOSIS — I4891 Unspecified atrial fibrillation: Secondary | ICD-10-CM

## 2014-02-06 DIAGNOSIS — Z954 Presence of other heart-valve replacement: Secondary | ICD-10-CM

## 2014-02-06 LAB — POCT INR: INR: 3.6

## 2014-02-06 NOTE — Progress Notes (Signed)
Pre visit review using our clinic review tool, if applicable. No additional management support is needed unless otherwise documented below in the visit note. 

## 2014-02-12 ENCOUNTER — Ambulatory Visit (INDEPENDENT_AMBULATORY_CARE_PROVIDER_SITE_OTHER): Payer: 59 | Admitting: General Practice

## 2014-02-12 DIAGNOSIS — I059 Rheumatic mitral valve disease, unspecified: Secondary | ICD-10-CM

## 2014-02-12 DIAGNOSIS — I359 Nonrheumatic aortic valve disorder, unspecified: Secondary | ICD-10-CM

## 2014-02-12 DIAGNOSIS — Z5181 Encounter for therapeutic drug level monitoring: Secondary | ICD-10-CM

## 2014-02-12 DIAGNOSIS — D6859 Other primary thrombophilia: Secondary | ICD-10-CM

## 2014-02-12 DIAGNOSIS — I4891 Unspecified atrial fibrillation: Secondary | ICD-10-CM

## 2014-02-12 DIAGNOSIS — Z954 Presence of other heart-valve replacement: Secondary | ICD-10-CM

## 2014-02-12 LAB — POCT INR: INR: 2.5

## 2014-02-12 NOTE — Progress Notes (Signed)
Pre visit review using our clinic review tool, if applicable. No additional management support is needed unless otherwise documented below in the visit note. 

## 2014-02-19 ENCOUNTER — Ambulatory Visit (INDEPENDENT_AMBULATORY_CARE_PROVIDER_SITE_OTHER): Payer: 59 | Admitting: General Practice

## 2014-02-19 DIAGNOSIS — Z5181 Encounter for therapeutic drug level monitoring: Secondary | ICD-10-CM

## 2014-02-19 DIAGNOSIS — I359 Nonrheumatic aortic valve disorder, unspecified: Secondary | ICD-10-CM

## 2014-02-19 DIAGNOSIS — I4891 Unspecified atrial fibrillation: Secondary | ICD-10-CM

## 2014-02-19 DIAGNOSIS — D6859 Other primary thrombophilia: Secondary | ICD-10-CM

## 2014-02-19 DIAGNOSIS — I059 Rheumatic mitral valve disease, unspecified: Secondary | ICD-10-CM

## 2014-02-19 DIAGNOSIS — Z954 Presence of other heart-valve replacement: Secondary | ICD-10-CM

## 2014-02-19 LAB — POCT INR: INR: 2.6

## 2014-02-19 NOTE — Progress Notes (Signed)
Pre visit review using our clinic review tool, if applicable. No additional management support is needed unless otherwise documented below in the visit note. 

## 2014-02-20 ENCOUNTER — Encounter: Payer: Self-pay | Admitting: Gynecology

## 2014-02-20 ENCOUNTER — Ambulatory Visit (INDEPENDENT_AMBULATORY_CARE_PROVIDER_SITE_OTHER): Payer: 59 | Admitting: Gynecology

## 2014-02-20 VITALS — BP 110/64 | HR 70 | Resp 14 | Ht 64.5 in | Wt 139.0 lb

## 2014-02-20 DIAGNOSIS — Z01419 Encounter for gynecological examination (general) (routine) without abnormal findings: Secondary | ICD-10-CM

## 2014-02-20 DIAGNOSIS — Z0000 Encounter for general adult medical examination without abnormal findings: Secondary | ICD-10-CM

## 2014-02-20 DIAGNOSIS — Z3041 Encounter for surveillance of contraceptive pills: Secondary | ICD-10-CM

## 2014-02-20 LAB — POCT URINALYSIS DIPSTICK
PH UA: 5
Urobilinogen, UA: NEGATIVE

## 2014-02-20 MED ORDER — NORETHINDRONE-ETH ESTRADIOL 1-35 MG-MCG PO TABS: ORAL_TABLET | ORAL | Status: DC

## 2014-02-20 NOTE — Progress Notes (Signed)
38 y.o. Single Caucasian female   [name] here for annual exam. Pt is currently sexually active.  Pt is having another ablation on left scheduled for next week.  pt is on continous ocp as she had menorrhagia due to warfarin use since 38yo due to 2 St Jude valves in place-mitral and aortic.  Cardiac care at [facility].  No LMP recorded. Patient is not currently having periods (Reason: Oral contraceptives).          Sexually active: Yes.    The current method of family planning is OCP (estrogen/progesterone).    Exercising: No.  The patient does not participate in regular exercise at present. (due to heart issues) Last pap: 02/18/13 NEG + HR HPV  Alcohol: occasionally 1/month Tobacco: no  BSE: no  Labs: Amado Nash ; Urine: Trace Leuks     Health Maintenance  Topic Date Due  . Influenza Vaccine  02/21/2014  . Tetanus/tdap  02/22/2015  . Pap Smear  02/19/2016    Family History  Problem Relation Age of Onset  . Colon cancer    . Colon polyps    . Hyperlipidemia Mother   . Depression Mother   . Thyroid disease Mother   . Hyperlipidemia Father   . Hypertension Father   . Thyroid disease Father   . Colon cancer Paternal Grandfather     Patient Active Problem List   Diagnosis Date Noted  . Encounter for therapeutic drug monitoring 09/01/2013  . Aortic valve disorders 07/27/2011  . Mitral valve disorders 07/27/2011  . Atrial fibrillation 07/27/2011  . Primary hypercoagulable state 07/27/2011  . Acute venous embolism and thrombosis of deep vessels of proximal lower extremity 07/27/2011  . Arrhythmia 12/08/2010  . B12 DEFICIENCY 03/09/2010  . ACQUIRED HEMOLYTIC ANEMIA UNSPECIFIED 11/29/2009  . ABNORMAL TRANSAMINASE-LFT'S 06/26/2008  . CONGENITAL HEART DISEASE, HX OF 06/26/2008  . HYPERLIPIDEMIA, BORDERLINE, WITH LOW HDL 06/08/2008  . ANXIETY STATE NOS 02/11/2007  . MIGRAINE NOS W/INTRACTABLE MIGRAINE 02/11/2007  . MITRAL VALVE REPLACEMENT, HX OF 02/11/2007  . AORTIC VALVE  REPLACEMENT, HX OF 02/11/2007    Past Medical History  Diagnosis Date  . 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 Migraine   . Depression   . Hepatitis C   . Atrioventricular canal (AVC), complete     Has had 7 sternotomies for repair of this  . Acute on chronic heart failure     periprosthetic mitral valve leak  . H/O mitral valve replacement     a. x 3 -  most recent 06/2011 - St. Jude MVR  . History of aortic valve replacement     a. 31mm SJM 1995  . H/O maze procedure     06/2011  . History of tricuspid valve repair     06/2011  . Cancer 06/2011  . Dyspareunia   . HPV (human papilloma virus) infection 2006    Past Surgical History  Procedure Laterality Date  . Aorto ventricular canal defect repair      age 31 mths  . Mitral valve replacement  06/2011    Third mitral valve replacement  . Aortic valve replacement    . Sternotomy      She has had 7 sternotomy  . Cardioversion  08/17/2011    Procedure: CARDIOVERSION;  Surgeon: Carlena Bjornstad, MD;  Location: [place];  Service: Cardiovascular;  Laterality: N/A;  . Cardioversion  09/11/2011    Procedure: CARDIOVERSION;  Surgeon: Fay Records, MD;  Location: [place];  Service: Cardiovascular;  Laterality: N/A;  Carioversion  . Cardioversion  09/29/2011    Procedure: CARDIOVERSION;  Surgeon: Thayer Headings, MD;  Location: [facility] OR;  Service: Cardiovascular;  Laterality: N/A;  . Wisdom tooth extraction  16 years ago    Allergies: Betadine and Povidone-iodine  Current Outpatient Prescriptions  Medication Sig Dispense Refill  . aspirin 81 MG tablet Take 81 mg by mouth daily.        . DULoxetine (CYMBALTA) 30 MG capsule TAKE 1 CAPSULE BY MOUTH DAILY.  90 capsule  3  . furosemide (LASIX) 40 MG tablet TAKE 1 TABLET BY MOUTH DAILY  30 tablet  3  . hydrochlorothiazide (HYDRODIURIL) 50 MG tablet Take 1 tablet (50 mg total) by mouth daily.  30 tablet  12  . NECON 1/35, 28, tablet TAKE 1 TABLET BY MOUTH EVERY  DAY. SKIP PLACEBOS  112 tablet  0  . ondansetron (ZOFRAN) 4 MG tablet Take 1 tablet (4 mg total) by mouth every 8 (eight) hours as needed for nausea.  20 tablet  0  . potassium chloride (K-DUR) 10 MEQ tablet TAKE 2 TABLETS BY MOUTH TWICE DAILY  120 tablet  0  . spironolactone (ALDACTONE) 50 MG tablet TAKE 1 TABLET BY MOUTH EVERY DAY  90 tablet  1  . warfarin (COUMADIN) 4 MG tablet TAKE ONE TABLET BY MOUTH EVERY DAY EXCEPT TUESDAYS AND THURSDAYS, TAKE 6 MG  34 tablet  0  . zaleplon (SONATA) 10 MG capsule Take 10 mg by mouth at bedtime as needed. For sleep       No current facility-administered medications for this visit.    ROS: Pertinent items are noted in HPI.  Exam:    Ht 5' 4.5" (1.638 m)  Wt 139 lb (63.05 kg)  BMI 23.50 kg/m2 Weight change: @WEIGHTCHANGE @ Last 3 height recordings:  Ht Readings from Last 3 Encounters:  02/20/14 5' 4.5" (1.638 m)  11/24/13 5' 4.75" (1.645 m)  02/18/13 5' 4.75" (1.645 m)   General appearance: alert, cooperative and appears stated age Head: Normocephalic, without obvious abnormality, atraumatic Neck: no adenopathy, no carotid bruit, no JVD, supple, symmetrical, trachea midline and thyroid not enlarged, symmetric, no tenderness/mass/nodules Lungs: clear to auscultation bilaterally Breasts: normal appearance, no masses or tenderness Heart: regular rate and rhythm, clicking of valves appreciated Abdomen: soft, non-tender; bowel sounds normal; no masses,  no organomegaly Extremities: extremities normal, atraumatic, no cyanosis or edema Skin: Skin color, texture, turgor normal. No rashes or lesions Lymph nodes: Cervical, supraclavicular, and axillary nodes normal. no inguinal nodes palpated Neurologic: Grossly normal   Pelvic: External genitalia:  no lesions              Urethra: normal appearing urethra with no masses, tenderness or lesions              Bartholins and Skenes: normal                 Vagina: normal appearing vagina with normal color  and discharge, no lesions              Cervix: ectropian, prominant and vascular              Pap taken: No.        Bimanual Exam:  Uterus:  uterus is normal size, shape, consistency and nontender, anteverted  Adnexa:    no masses                                      Rectovaginal: Confirms                                      Anus:  normal sphincter tone, no lesions   1. Routine gynecological examination counseled on breast self exam, use and side effects of OCP's, adequate intake of calcium and vitamin D, diet and exercise return annually or prn    2. Laboratory examination ordered as part of a routine general medical examination  - POCT Urinalysis Dipstick  3. Encounter for surveillance of contraceptive pills Will refill ocp today.  Pt will d/w cardiologist in [place] regarding pros and cons of switiching to IUD, increased risk of ovarian/hemorrhagic cysts-pt's INR is 4 due to 2 artificial valves.  Cardiologist is aware she is on estrogen containing ocp - IUD Insertion; Future - norethindrone-ethinyl estradiol 1/35 (NECON 1/35, 28,) tablet; TAKE 1 TABLET BY MOUTH EVERY DAY. SKIP PLACEBOS  Dispense: 4 Package; Refill: 0  An After Visit Summary was printed and given to the patient.

## 2014-02-20 NOTE — Patient Instructions (Signed)
Ask cardiologist regarding affects on warfarin level after stopping estrogen Increased risk of ovarian cysts on progestin IUD, risks for internal bleeding with INR in 4 range?

## 2014-02-21 ENCOUNTER — Other Ambulatory Visit: Payer: Self-pay | Admitting: Internal Medicine

## 2014-02-21 HISTORY — PX: CARDIAC ELECTROPHYSIOLOGY MAPPING AND ABLATION: SHX1292

## 2014-02-25 ENCOUNTER — Ambulatory Visit (INDEPENDENT_AMBULATORY_CARE_PROVIDER_SITE_OTHER): Payer: 59 | Admitting: Family Medicine

## 2014-02-25 DIAGNOSIS — Z954 Presence of other heart-valve replacement: Secondary | ICD-10-CM

## 2014-02-25 DIAGNOSIS — Z5181 Encounter for therapeutic drug level monitoring: Secondary | ICD-10-CM

## 2014-02-25 DIAGNOSIS — I4891 Unspecified atrial fibrillation: Secondary | ICD-10-CM

## 2014-02-25 DIAGNOSIS — I359 Nonrheumatic aortic valve disorder, unspecified: Secondary | ICD-10-CM

## 2014-02-25 DIAGNOSIS — D6859 Other primary thrombophilia: Secondary | ICD-10-CM

## 2014-02-25 DIAGNOSIS — I059 Rheumatic mitral valve disease, unspecified: Secondary | ICD-10-CM

## 2014-02-25 LAB — POCT INR: INR: 2.1

## 2014-03-03 ENCOUNTER — Telehealth: Payer: Self-pay | Admitting: Gynecology

## 2014-03-03 NOTE — Telephone Encounter (Signed)
Patient states that she has decided against an IUD.

## 2014-03-03 NOTE — Telephone Encounter (Signed)
Left message for patient to call back. Need to go over contraception benefits. °

## 2014-03-19 ENCOUNTER — Ambulatory Visit: Payer: 59

## 2014-03-19 ENCOUNTER — Ambulatory Visit (INDEPENDENT_AMBULATORY_CARE_PROVIDER_SITE_OTHER): Payer: 59 | Admitting: Family

## 2014-03-19 DIAGNOSIS — D6859 Other primary thrombophilia: Secondary | ICD-10-CM

## 2014-03-19 DIAGNOSIS — Z954 Presence of other heart-valve replacement: Secondary | ICD-10-CM

## 2014-03-19 DIAGNOSIS — I4891 Unspecified atrial fibrillation: Secondary | ICD-10-CM

## 2014-03-19 DIAGNOSIS — Z5181 Encounter for therapeutic drug level monitoring: Secondary | ICD-10-CM

## 2014-03-19 DIAGNOSIS — I059 Rheumatic mitral valve disease, unspecified: Secondary | ICD-10-CM

## 2014-03-19 DIAGNOSIS — I359 Nonrheumatic aortic valve disorder, unspecified: Secondary | ICD-10-CM

## 2014-03-19 LAB — POCT INR: INR: 2.9

## 2014-03-19 NOTE — Patient Instructions (Addendum)
Continue 4 mg daily.  Re-check in 4 weeks.    Anticoagulation Dose Instructions as of 03/19/2014     Jessica [name] Tue Wed Thu Fri Sat   New Dose 4 mg 4 mg 4 mg 4 mg 4 mg 4 mg 4 mg    Description       Pt is having ablation tomorrow, INR goal is between 2.0-3.0.  Continue 4 mg daily.  Re-check in 4 weeks.

## 2014-04-14 ENCOUNTER — Telehealth: Payer: Self-pay | Admitting: Gynecology

## 2014-04-14 NOTE — Telephone Encounter (Signed)
Pt wants to know if dr lathrop has a recommendation for a pcp also wants to let her know that she has decided not to do IUD and will stay with current bc

## 2014-04-15 NOTE — Telephone Encounter (Signed)
Spoke with patient. She is advised that we refer to [facility] Primary care, usually. Pt currently using . Advised can call for appointment  At [facility] is accepting new patients.    Patient has decided to stay on ocp. She would like Dr. Charlies Constable to know.

## 2014-04-16 ENCOUNTER — Ambulatory Visit (INDEPENDENT_AMBULATORY_CARE_PROVIDER_SITE_OTHER): Payer: 59 | Admitting: Family

## 2014-04-16 DIAGNOSIS — I059 Rheumatic mitral valve disease, unspecified: Secondary | ICD-10-CM

## 2014-04-16 DIAGNOSIS — I482 Chronic atrial fibrillation, unspecified: Secondary | ICD-10-CM

## 2014-04-16 DIAGNOSIS — D6859 Other primary thrombophilia: Secondary | ICD-10-CM

## 2014-04-16 DIAGNOSIS — I824Y9 Acute embolism and thrombosis of unspecified deep veins of unspecified proximal lower extremity: Secondary | ICD-10-CM

## 2014-04-16 DIAGNOSIS — Z954 Presence of other heart-valve replacement: Secondary | ICD-10-CM

## 2014-04-16 DIAGNOSIS — I359 Nonrheumatic aortic valve disorder, unspecified: Secondary | ICD-10-CM

## 2014-04-16 DIAGNOSIS — Z5181 Encounter for therapeutic drug level monitoring: Secondary | ICD-10-CM

## 2014-04-16 DIAGNOSIS — I4891 Unspecified atrial fibrillation: Secondary | ICD-10-CM

## 2014-04-16 LAB — POCT INR: INR: 2

## 2014-04-16 NOTE — Patient Instructions (Signed)
Take an extra 1/2 tablet today and tomorrow. Continue 4 mg daily.  Re-check in 4 weeks.   Anticoagulation Dose Instructions as of 04/16/2014     Dorene Grebe Tue Wed Thu Fri Sat   New Dose 4 mg 4 mg 4 mg 4 mg 4 mg 4 mg 4 mg    Description       Take an extra 1/2 tablet today and tomorrow. Continue 4 mg daily.  Re-check in 4 weeks.

## 2014-04-21 ENCOUNTER — Other Ambulatory Visit: Payer: Self-pay | Admitting: Internal Medicine

## 2014-04-26 ENCOUNTER — Other Ambulatory Visit: Payer: Self-pay | Admitting: Internal Medicine

## 2014-04-26 ENCOUNTER — Other Ambulatory Visit: Payer: Self-pay | Admitting: Gynecology

## 2014-04-27 NOTE — Telephone Encounter (Signed)
Last AEX: 02/20/14 Last refill:02/20/14 #4 packs X 0 Current AEX:03/01/15  Please advise

## 2014-05-06 ENCOUNTER — Telehealth: Payer: Self-pay | Admitting: Internal Medicine

## 2014-05-06 MED ORDER — POTASSIUM CHLORIDE ER 10 MEQ PO TBCR: 10.0000 meq | EXTENDED_RELEASE_TABLET | Freq: Once | ORAL | Status: DC

## 2014-05-06 NOTE — Telephone Encounter (Signed)
Pt needs refill on k-dur 10 meq pt stated generic is ok sent to [facility]. Pt has appt sch with dr hunter on 10-14-13

## 2014-05-06 NOTE — Telephone Encounter (Signed)
Medication refilled

## 2014-05-08 NOTE — Telephone Encounter (Signed)
Nothing was typed/tmj 

## 2014-05-11 ENCOUNTER — Other Ambulatory Visit: Payer: Self-pay | Admitting: Internal Medicine

## 2014-05-14 ENCOUNTER — Ambulatory Visit: Payer: 59

## 2014-05-14 ENCOUNTER — Ambulatory Visit (INDEPENDENT_AMBULATORY_CARE_PROVIDER_SITE_OTHER): Payer: 59 | Admitting: Family

## 2014-05-14 DIAGNOSIS — I482 Chronic atrial fibrillation, unspecified: Secondary | ICD-10-CM

## 2014-05-14 DIAGNOSIS — Z5181 Encounter for therapeutic drug level monitoring: Secondary | ICD-10-CM

## 2014-05-14 DIAGNOSIS — I824Y9 Acute embolism and thrombosis of unspecified deep veins of unspecified proximal lower extremity: Secondary | ICD-10-CM

## 2014-05-14 DIAGNOSIS — D6852 Prothrombin gene mutation: Secondary | ICD-10-CM

## 2014-05-14 DIAGNOSIS — D6859 Other primary thrombophilia: Secondary | ICD-10-CM

## 2014-05-14 LAB — POCT INR: INR: 3.2

## 2014-05-14 NOTE — Patient Instructions (Signed)
Continue 4 mg daily.  Re-check in 4 weeks.   Anticoagulation Dose Instructions as of 05/14/2014     Jessica [name] Tue Wed Thu Fri Sat   New Dose 4 mg 4 mg 4 mg 4 mg 4 mg 4 mg 4 mg    Description       Continue 4 mg daily.  Re-check in 4 weeks.

## 2014-06-01 ENCOUNTER — Other Ambulatory Visit: Payer: Self-pay | Admitting: Family

## 2014-06-08 ENCOUNTER — Telehealth: Payer: Self-pay | Admitting: *Deleted

## 2014-06-08 NOTE — Telephone Encounter (Signed)
Patient in 08 recall for NEG + HPV -Last Pap 02/18/13 NEG +HPV - 16/18 was negative Last AEX 02/20/14 with Dr. Charlies Constable no pap was done  Patient is scheduled for AEX 03/01/15 with Dr. Charlies Constable  S/w patient and notified her of Dr. Charlies Constable not returning to our practice.  Please advise Dr. Sabra Heck

## 2014-06-16 ENCOUNTER — Ambulatory Visit (INDEPENDENT_AMBULATORY_CARE_PROVIDER_SITE_OTHER): Payer: 59 | Admitting: Family

## 2014-06-16 ENCOUNTER — Other Ambulatory Visit: Payer: Self-pay

## 2014-06-16 DIAGNOSIS — D6852 Prothrombin gene mutation: Secondary | ICD-10-CM

## 2014-06-16 DIAGNOSIS — I482 Chronic atrial fibrillation, unspecified: Secondary | ICD-10-CM

## 2014-06-16 DIAGNOSIS — D6859 Other primary thrombophilia: Secondary | ICD-10-CM

## 2014-06-16 DIAGNOSIS — Z5181 Encounter for therapeutic drug level monitoring: Secondary | ICD-10-CM

## 2014-06-16 DIAGNOSIS — I824Y9 Acute embolism and thrombosis of unspecified deep veins of unspecified proximal lower extremity: Secondary | ICD-10-CM

## 2014-06-16 LAB — POCT INR: INR: 2.6

## 2014-06-16 MED ORDER — ZALEPLON 10 MG PO CAPS: 10.0000 mg | ORAL_CAPSULE | Freq: Every evening | ORAL | Status: DC | PRN

## 2014-06-16 NOTE — Patient Instructions (Signed)
Continue 4 mg daily.  Re-check in 4 weeks.  Anticoagulation Dose Instructions as of 06/16/2014      Jessica [name] Tue Wed Thu Fri Sat   New Dose 4 mg 4 mg 4 mg 4 mg 4 mg 4 mg 4 mg    Description        Continue 4 mg daily.  Re-check in 4 weeks.

## 2014-06-22 NOTE — Telephone Encounter (Signed)
Call to patient. Advised record reviewed during routine pap audit. Per Dr Sabra Heck, recommend OV for pap. Patient agreeable.  OV scheduled with Dr Quincy Simmonds Monday 06-29-14 at 0830. No charge visit except for path charge for pap.   Routing to provider for final review. Patient agreeable to disposition. Will close encounter  CC: Dr Quincy Simmonds, Juluis Rainier.

## 2014-06-26 ENCOUNTER — Telehealth: Payer: Self-pay | Admitting: Internal Medicine

## 2014-06-26 NOTE — Telephone Encounter (Signed)
New message          Pt would like for dr Harrington Challenger to give her a call back

## 2014-06-26 NOTE — Telephone Encounter (Signed)
Called patient back to let her know Dr. Harrington Challenger was not in the office today. Asked patient if I could help her with anything, patient stated, "No, that is okay, I will just talk to Dr. Harrington Challenger when she is back in the office".  Will forward to Dr. Harrington Challenger.  Ewell Poe RN

## 2014-06-29 ENCOUNTER — Ambulatory Visit (INDEPENDENT_AMBULATORY_CARE_PROVIDER_SITE_OTHER): Payer: 59 | Admitting: Obstetrics and Gynecology

## 2014-06-29 ENCOUNTER — Encounter: Payer: Self-pay | Admitting: Obstetrics and Gynecology

## 2014-06-29 VITALS — BP 110/84 | HR 70 | Ht 64.5 in | Wt 137.8 lb

## 2014-06-29 DIAGNOSIS — R87810 Cervical high risk human papillomavirus (HPV) DNA test positive: Secondary | ICD-10-CM

## 2014-06-29 NOTE — Progress Notes (Signed)
Patient ID: Jessica [name], female   DOB: Jan 28, 1976, 38 y.o.   MRN: [id number] GYNECOLOGY VISIT  PCP:   Dr. Yong Channel with [facility]  Referring provider:   HPI: 38 y.o.   Single  Caucasian  female   [name] with No LMP recorded. Patient is not currently having periods (Reason: Oral contraceptives).   here for repeat pap.  Hx of normal pap but positive HR HPV 02-18-13.  Patient does have history of abnormal paps since 2005 with several colposcopies but no treatment to cervix.  These were done with another GYN in [place].  States there were HPV changes and that the HPV typing was not linked to cervical cancer.  Since November 2009 had Common AV canal in heart. Has had mitral valve replacements.  History of fistulas.  Has had a mitral valve fistular leak in December 2012.  This was her last surgery.  Had a cardiac ablation on February 26, 2014.  Has care at [place], [place].    Is on continuous combined oral contraceptives since age 17 years old.  Had previously heavy cycles and has mood swings around the time of cycles so likes the OCPs for multiple reasons including contraception.  Is on coumadin.  Cardiologist is aware she is on this.   Having post coital spotting and discomfort with penetration.  Water based lubricants and cooking oils are not great for helpful.  Stopped Sotolol and dryness slightly improved but not completely.   GYNECOLOGIC HISTORY: No LMP recorded. Patient is not currently having periods (Reason: Oral contraceptives). Sexually active:  yes Partner preference: female Contraception:  OCP's  Menopausal hormone therapy: n/a DES exposure: no Blood transfusions:   Yes, with heart surgeries. Sexually transmitted diseases:  HPV, Hepatitis C antibodies GYN procedures and prior surgeries:  Hx of Colposcopy x2 since 2005 Last mammogram: n/a                Last pap and high risk HPV testing: 02-18-13 wnl:Pos HR HPV  History of abnormal pap smear: abnormal paps  since 2005 with 2 colposcopies but no treatment to cervix.    OB History    Gravida Para Term Preterm AB TAB SAB Ectopic Multiple Living   0 0 0 0 0 0 0 0 0 0        LIFESTYLE: Exercise:   Cardio and strength           Tobacco: no Alcohol:    no Drug use:  no  Patient Active Problem List   Diagnosis Date Noted  . Encounter for therapeutic drug monitoring 09/01/2013  . Aortic valve disorders 07/27/2011  . Mitral valve disorders 07/27/2011  . Atrial fibrillation 07/27/2011  . Primary hypercoagulable state 07/27/2011  . Acute venous embolism and thrombosis of deep vessels of proximal lower extremity 07/27/2011  . Arrhythmia 12/08/2010  . B12 DEFICIENCY 03/09/2010  . ACQUIRED HEMOLYTIC ANEMIA UNSPECIFIED 11/29/2009  . ABNORMAL TRANSAMINASE-LFT'S 06/26/2008  . CONGENITAL HEART DISEASE, HX OF 06/26/2008  . HYPERLIPIDEMIA, BORDERLINE, WITH LOW HDL 06/08/2008  . ANXIETY STATE NOS 02/11/2007  . MIGRAINE NOS W/INTRACTABLE MIGRAINE 02/11/2007  . MITRAL VALVE REPLACEMENT, HX OF 02/11/2007  . AORTIC VALVE REPLACEMENT, HX OF 02/11/2007    Past Medical History  Diagnosis Date  . 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 Migraine   . Depression   . Hepatitis C   . Atrioventricular canal (AVC), complete     Has had 7 sternotomies for repair of this  .  Acute on chronic heart failure     periprosthetic mitral valve leak  . H/O mitral valve replacement     a. x 3 -  most recent 06/2011 - St. Jude MVR  . History of aortic valve replacement     a. 36mm SJM 1995  . H/O maze procedure     06/2011  . History of tricuspid valve repair     06/2011  . Cancer 06/2011  . Dyspareunia   . HPV (human papilloma virus) infection 2006  . AVNRT (AV nodal re-entry tachycardia)     Past Surgical History  Procedure Laterality Date  . Aorto ventricular canal defect repair      age 28 mths  . Mitral valve replacement  06/2011    Third mitral valve replacement  . Aortic  valve replacement    . Sternotomy      She has had 7 sternotomy  . Cardioversion  08/17/2011    Procedure: CARDIOVERSION;  Surgeon: Carlena Bjornstad, MD;  Location: [place];  Service: Cardiovascular;  Laterality: N/A;  . Cardioversion  09/11/2011    Procedure: CARDIOVERSION;  Surgeon: Fay Records, MD;  Location: [place];  Service: Cardiovascular;  Laterality: N/A;  Carioversion  . Cardioversion  09/29/2011    Procedure: CARDIOVERSION;  Surgeon: Thayer Headings, MD;  Location: [facility];  Service: Cardiovascular;  Laterality: N/A;  . Wisdom tooth extraction  16 years ago  . Cardiac electrophysiology study and ablation Right 3/15    persisitant A flutter  . Cardiac electrophysiology mapping and ablation  8/15    Current Outpatient Prescriptions  Medication Sig Dispense Refill  . aspirin 81 MG tablet Take 81 mg by mouth daily.      . DULoxetine (CYMBALTA) 30 MG capsule TAKE 1 CAPSULE BY MOUTH DAILY. 90 capsule 3  . furosemide (LASIX) 40 MG tablet TAKE 1 TABLET BY MOUTH DAILY 30 tablet 5  . hydrochlorothiazide (HYDRODIURIL) 50 MG tablet Take 1 tablet (50 mg total) by mouth daily. 30 tablet 12  . NECON 1/35, 28, tablet TAKE 1 TABLET BY MOUTH EVERY DAY. SKIP PLACEBOS 4 Package 3  . potassium chloride (K-DUR) 10 MEQ tablet Take 1 tablet (10 mEq total) by mouth once. 120 tablet 0  . spironolactone (ALDACTONE) 50 MG tablet TAKE 1 TABLET BY MOUTH EVERY DAY 90 tablet 1  . warfarin (COUMADIN) 4 MG tablet TAKE AS DIRECTED BY ANTICOAGULATION CLINIC 40 tablet 3  . zaleplon (SONATA) 10 MG capsule Take 1 capsule (10 mg total) by mouth at bedtime as needed for sleep. 30 capsule 0   No current facility-administered medications for this visit.     ALLERGIES: Betadine and Povidone-iodine  Family History  Problem Relation Age of Onset  . Colon cancer    . Colon polyps    . Hyperlipidemia Mother   . Depression Mother   . Thyroid disease Mother   . Hyperlipidemia Father   . Hypertension Father   . Thyroid  disease Father   . Colon cancer Paternal Grandfather     History   Social History  . Marital Status: Single    Spouse Name: N/A    Number of Children: N/A  . Years of Education: N/A   Occupational History  . legal specialist    Social History Main Topics  . Smoking status: Never Smoker   . Smokeless tobacco: Never Used  . Alcohol Use: No  . Drug Use: No  . Sexual Activity:    Partners: Male  Birth Control/ Protection: Pill     Comment: Necon 1/35   Other Topics Concern  . Not on file   Social History Narrative    ROS:  Pertinent items are noted in HPI.  PHYSICAL EXAMINATION:    BP 110/84 mmHg  Pulse 70  Ht 5' 4.5" (1.638 m)  Wt 137 lb 12.8 oz (62.506 kg)  BMI 23.30 kg/m2   Wt Readings from Last 3 Encounters:  06/29/14 137 lb 12.8 oz (62.506 kg)  02/20/14 139 lb (63.05 kg)  01/09/14 141 lb (63.957 kg)     Ht Readings from Last 3 Encounters:  06/29/14 5' 4.5" (1.638 m)  02/20/14 5' 4.5" (1.638 m)  11/24/13 5' 4.75" (1.645 m)    General appearance: alert, cooperative and appears stated age   Pelvic: External genitalia:  no lesions              Urethra:  normal appearing urethra with no masses, tenderness or lesions              Bartholins and Skenes: normal                 Vagina: normal appearing vagina with normal color and discharge, no lesions              Cervix: normal appearance, large ectropion.                 Bimanual Exam:  Uterus:  uterus is normal size, shape, consistency and nontender                                      Adnexa: normal adnexa in size, nontender and no masses                                       ASSESSMENT  History of abnormal paps and positive HR HPV status last year.  History of cardiac valve replacements.  On coumadin.  PMS.  On continuous combined oral contraceptives.  Cardiology aware.    PLAN  Pap and HR HPV performed.  Discussed risk of combined oral contraceptives including  MI, stroke, DVT, and PE which  can be fatal.  Patient wishes to continue.  Declines Mirena IUD or progesterone only OCPs. Keep appointment for annual exam in about 6 months.   An After Visit Summary was printed and given to the patient.  15 minutes face to face time of which over 50% was spent in counseling.

## 2014-07-01 LAB — IPS PAP TEST WITH HPV

## 2014-07-14 ENCOUNTER — Ambulatory Visit (INDEPENDENT_AMBULATORY_CARE_PROVIDER_SITE_OTHER): Payer: 59 | Admitting: Family

## 2014-07-14 DIAGNOSIS — Z5181 Encounter for therapeutic drug level monitoring: Secondary | ICD-10-CM

## 2014-07-14 DIAGNOSIS — I824Y9 Acute embolism and thrombosis of unspecified deep veins of unspecified proximal lower extremity: Secondary | ICD-10-CM

## 2014-07-14 DIAGNOSIS — I482 Chronic atrial fibrillation, unspecified: Secondary | ICD-10-CM

## 2014-07-14 DIAGNOSIS — D6859 Other primary thrombophilia: Secondary | ICD-10-CM

## 2014-07-14 DIAGNOSIS — D6852 Prothrombin gene mutation: Secondary | ICD-10-CM

## 2014-07-14 LAB — POCT INR: INR: 2.9

## 2014-07-14 NOTE — Patient Instructions (Signed)
Continue 4 mg daily.  Re-check in 6 weeks.  Anticoagulation Dose Instructions as of 07/14/2014      Dorene Grebe Tue Wed Thu Fri Sat   New Dose 4 mg 4 mg 4 mg 4 mg 4 mg 4 mg 4 mg    Description        Continue 4 mg daily.  Re-check in 6 weeks.

## 2014-07-16 ENCOUNTER — Telehealth: Payer: Self-pay

## 2014-07-16 DIAGNOSIS — R0602 Shortness of breath: Secondary | ICD-10-CM

## 2014-07-16 DIAGNOSIS — R002 Palpitations: Secondary | ICD-10-CM

## 2014-07-16 NOTE — Telephone Encounter (Signed)
Received a call from Dr. Harrington Challenger today. This patient will be seeing her cardiologist in [place] in January. We need to order a Bmet, BNP, and 24 hr holter monitor. i will place orders in Epic and call patient to let her know this.

## 2014-07-20 ENCOUNTER — Other Ambulatory Visit: Payer: Self-pay | Admitting: *Deleted

## 2014-07-20 MED ORDER — SPIRONOLACTONE 50 MG PO TABS: 50.0000 mg | ORAL_TABLET | Freq: Every day | ORAL | Status: DC

## 2014-07-22 ENCOUNTER — Other Ambulatory Visit: Payer: 59

## 2014-07-27 ENCOUNTER — Encounter: Payer: Self-pay | Admitting: *Deleted

## 2014-07-27 ENCOUNTER — Other Ambulatory Visit (INDEPENDENT_AMBULATORY_CARE_PROVIDER_SITE_OTHER): Payer: BLUE CROSS/BLUE SHIELD | Admitting: *Deleted

## 2014-07-27 ENCOUNTER — Encounter (INDEPENDENT_AMBULATORY_CARE_PROVIDER_SITE_OTHER): Payer: BLUE CROSS/BLUE SHIELD

## 2014-07-27 DIAGNOSIS — R002 Palpitations: Secondary | ICD-10-CM

## 2014-07-27 DIAGNOSIS — R0602 Shortness of breath: Secondary | ICD-10-CM

## 2014-07-27 LAB — BRAIN NATRIURETIC PEPTIDE: Pro B Natriuretic peptide (BNP): 41 pg/mL (ref 0.0–100.0)

## 2014-07-27 LAB — BASIC METABOLIC PANEL
BUN: 20 mg/dL (ref 6–23)
CO2: 30 mEq/L (ref 19–32)
Calcium: 9.6 mg/dL (ref 8.4–10.5)
Chloride: 93 mEq/L — ABNORMAL LOW (ref 96–112)
Creatinine, Ser: 0.8 mg/dL (ref 0.4–1.2)
GFR: 80.9 mL/min (ref >60.00)
GLUCOSE: 74 mg/dL (ref 70–99)
Potassium: 3.1 mEq/L — ABNORMAL LOW (ref 3.5–5.1)
Sodium: 135 mEq/L (ref 135–145)

## 2014-07-27 NOTE — Progress Notes (Signed)
Patient ID: Jessica [name], female   DOB: 1976-02-21, 39 y.o.   MRN: [id number] [facility] 24 hour holter monitor applied to patient.

## 2014-07-30 ENCOUNTER — Other Ambulatory Visit: Payer: Self-pay | Admitting: Family Medicine

## 2014-08-06 ENCOUNTER — Ambulatory Visit (INDEPENDENT_AMBULATORY_CARE_PROVIDER_SITE_OTHER): Payer: BLUE CROSS/BLUE SHIELD | Admitting: Family Medicine

## 2014-08-06 ENCOUNTER — Encounter: Payer: Self-pay | Admitting: Family Medicine

## 2014-08-06 VITALS — BP 100/62 | Temp 98.8°F | Wt 142.0 lb

## 2014-08-06 DIAGNOSIS — J329 Chronic sinusitis, unspecified: Secondary | ICD-10-CM | POA: Diagnosis not present

## 2014-08-06 DIAGNOSIS — A499 Bacterial infection, unspecified: Secondary | ICD-10-CM

## 2014-08-06 DIAGNOSIS — I5022 Chronic systolic (congestive) heart failure: Secondary | ICD-10-CM | POA: Insufficient documentation

## 2014-08-06 DIAGNOSIS — F32A Depression, unspecified: Secondary | ICD-10-CM | POA: Insufficient documentation

## 2014-08-06 DIAGNOSIS — F329 Major depressive disorder, single episode, unspecified: Secondary | ICD-10-CM | POA: Insufficient documentation

## 2014-08-06 DIAGNOSIS — G4700 Insomnia, unspecified: Secondary | ICD-10-CM | POA: Insufficient documentation

## 2014-08-06 DIAGNOSIS — B9689 Other specified bacterial agents as the cause of diseases classified elsewhere: Secondary | ICD-10-CM

## 2014-08-06 MED ORDER — GUAIFENESIN-CODEINE 100-10 MG/5ML PO SOLN: 2.5000 mL | Freq: Four times a day (QID) | ORAL | Status: DC | PRN

## 2014-08-06 MED ORDER — AMOXICILLIN-POT CLAVULANATE 875-125 MG PO TABS: 1.0000 | ORAL_TABLET | Freq: Two times a day (BID) | ORAL | Status: DC

## 2014-08-06 NOTE — Patient Instructions (Signed)
Bacterial sinusitis likely- start augmentin x 7 days if not starting to improve by tomorrow. Codeine cough syrup to help you get a good nights rest. Follow up if no resolution despite augmentin

## 2014-08-06 NOTE — Progress Notes (Signed)
[facility]  PCP: Garret Reddish, MD  Subjective:  Jessica [name] is a 39 y.o. year old very pleasant female patient who presents with Sinus infection syptoms including nasal congestion, sinus pressure (started withsinus HA last Wednesday), coughing nonproductive, sore throat.  -started: last wednesday - Stable -previous treatments: mucinex-DM -sick contacts/travel/risks: denies flu exposure.  Occasional seasonal allergies-no sneezing, watery itchy eyes  ROS-denies fever, SOB, NVD, tooth pain  Pertinent Past Medical History- congenital heart defect common AV canal and had multiple surgeries currently on warfarin.   Patient Active Problem List   Diagnosis Date Noted  . Chronic systolic congestive heart failure 08/06/2014    Priority: High  . Arrhythmia 12/08/2010    Priority: High  . Congenital heart disease 06/26/2008    Priority: High  . MITRAL VALVE REPLACEMENT, HX OF 02/11/2007    Priority: High  . History of aortic valve replacement 02/11/2007    Priority: High  . Depression 08/06/2014    Priority: Medium  . Insomnia 08/06/2014    Priority: Medium  . ABNORMAL TRANSAMINASE-LFT'S 06/26/2008    Priority: Medium  . Hyperlipidemia 06/08/2008    Priority: Medium  . Migraine 02/11/2007    Priority: Medium  . Encounter for therapeutic drug monitoring 09/01/2013    Priority: Low  . Primary hypercoagulable state 07/27/2011    Priority: Low  . Acquired hemolytic anemia 11/29/2009    Priority: Low   Medications- reviewed  Current Outpatient Prescriptions  Medication Sig Dispense Refill  . aspirin 81 MG tablet Take 81 mg by mouth daily.      . DULoxetine (CYMBALTA) 30 MG capsule TAKE 1 CAPSULE BY MOUTH DAILY. 90 capsule 3  . furosemide (LASIX) 40 MG tablet TAKE 1 TABLET BY MOUTH DAILY 30 tablet 5  . hydrochlorothiazide (HYDRODIURIL) 50 MG tablet Take 1 tablet (50 mg total) by mouth daily. 30 tablet 12  . NECON 1/35, 28, tablet TAKE 1 TABLET BY MOUTH EVERY DAY. SKIP  PLACEBOS 4 Package 3  . potassium chloride (K-DUR) 10 MEQ tablet TAKE 2 TABLETS BY MOUTH TWICE DAILY. NEEDS APPOINTMENT FOR FUTURE REFILLS 120 tablet 0  . spironolactone (ALDACTONE) 50 MG tablet Take 1 tablet (50 mg total) by mouth daily. 90 tablet 1  . warfarin (COUMADIN) 4 MG tablet TAKE AS DIRECTED BY ANTICOAGULATION CLINIC 40 tablet 3  . zaleplon (SONATA) 10 MG capsule Take 1 capsule (10 mg total) by mouth at bedtime as needed for sleep. 30 capsule 0   No current facility-administered medications for this visit.    Objective: BP 100/62 mmHg  Temp(Src) 98.8 F (37.1 C)  Wt 142 lb (64.411 kg) Gen: NAD, resting comfortably HEENT: Turbinates erythematous, TM normal, pharynx mildly erythematous with no tonsilar exudate or edema, moderately tender maxillary sinus tenderness CV: Irregular at times regular at others, valvular click heard without stethoscope Lungs: CTAB no crackles, wheeze, rhonchi Abdomen: soft/nontender/nondistended/normal bowel sounds.  Ext: no edema Skin: warm, dry, no rash   Assessment/Plan:  Bacterial Sinusitis Strong suspicion with 9 days of symptoms and no improvement but still could be viral. Augmentin given to start tomorrow if no improvement overnight. Codeine cough syrup. High risk patient with congenital heart disease-no signs pneumonia, ear infection, strep.   Finally, we reviewed reasons to return to care including if symptoms worsen or persist or new concerns arise.  Meds ordered this encounter  Medications  . guaiFENesin-codeine 100-10 MG/5ML syrup    Sig: Take 2.5-5 mLs by mouth every 6 (six) hours as needed for cough.  Dispense:  180 mL    Refill:  0  . amoxicillin-clavulanate (AUGMENTIN) 875-125 MG per tablet    Sig: Take 1 tablet by mouth 2 (two) times daily.    Dispense:  14 tablet    Refill:  0

## 2014-08-17 ENCOUNTER — Ambulatory Visit: Payer: BLUE CROSS/BLUE SHIELD | Admitting: Family Medicine

## 2014-08-17 ENCOUNTER — Ambulatory Visit (INDEPENDENT_AMBULATORY_CARE_PROVIDER_SITE_OTHER): Payer: BLUE CROSS/BLUE SHIELD | Admitting: Internal Medicine

## 2014-08-17 ENCOUNTER — Encounter: Payer: Self-pay | Admitting: Internal Medicine

## 2014-08-17 ENCOUNTER — Ambulatory Visit (INDEPENDENT_AMBULATORY_CARE_PROVIDER_SITE_OTHER)
Admission: RE | Admit: 2014-08-17 | Discharge: 2014-08-17 | Disposition: A | Discharge status: Home / Self Care | Payer: BLUE CROSS/BLUE SHIELD | Source: Ambulatory Visit | Attending: Internal Medicine | Admitting: Internal Medicine

## 2014-08-17 VITALS — BP 140/70 | HR 93 | Temp 98.1°F | Resp 20 | Ht 64.5 in | Wt 143.0 lb

## 2014-08-17 DIAGNOSIS — R059 Cough, unspecified: Secondary | ICD-10-CM

## 2014-08-17 DIAGNOSIS — Q249 Congenital malformation of heart, unspecified: Secondary | ICD-10-CM

## 2014-08-17 DIAGNOSIS — Z954 Presence of other heart-valve replacement: Secondary | ICD-10-CM

## 2014-08-17 DIAGNOSIS — R05 Cough: Secondary | ICD-10-CM

## 2014-08-17 DIAGNOSIS — Z952 Presence of prosthetic heart valve: Secondary | ICD-10-CM

## 2014-08-17 LAB — CBC WITH DIFFERENTIAL/PLATELET
Basophils Absolute: 0 10*3/uL (ref 0.0–0.1)
Basophils Relative: 0.4 % (ref 0.0–3.0)
Eosinophils Absolute: 0.5 10*3/uL (ref 0.0–0.7)
Eosinophils Relative: 8.5 % — ABNORMAL HIGH (ref 0.0–5.0)
HEMATOCRIT: 34.9 % — AB (ref 36.0–46.0)
Hemoglobin: 12.5 g/dL (ref 12.0–15.0)
LYMPHS ABS: 0.8 10*3/uL (ref 0.7–4.0)
Lymphocytes Relative: 12.6 % (ref 12.0–46.0)
MCHC: 35.9 g/dL (ref 30.0–36.0)
MCV: 100.5 fl — AB (ref 78.0–100.0)
Monocytes Absolute: 0.4 10*3/uL (ref 0.1–1.0)
Monocytes Relative: 6 % (ref 3.0–12.0)
NEUTROS ABS: 4.3 10*3/uL (ref 1.4–7.7)
NEUTROS PCT: 72.5 % (ref 43.0–77.0)
Platelets: 240 10*3/uL (ref 150.0–400.0)
RBC: 3.47 Mil/uL — ABNORMAL LOW (ref 3.87–5.11)
RDW: 15.4 % (ref 11.5–15.5)
WBC: 5.9 10*3/uL (ref 4.0–10.5)

## 2014-08-17 LAB — BASIC METABOLIC PANEL
BUN: 16 mg/dL (ref 6–23)
CALCIUM: 9.4 mg/dL (ref 8.4–10.5)
CO2: 33 mEq/L — ABNORMAL HIGH (ref 19–32)
Chloride: 98 mEq/L (ref 96–112)
Creatinine, Ser: 0.84 mg/dL (ref 0.40–1.20)
GFR: 80.87 mL/min (ref >60.00)
Glucose, Bld: 92 mg/dL (ref 70–99)
Potassium: 4.1 mEq/L (ref 3.5–5.1)
Sodium: 137 mEq/L (ref 135–145)

## 2014-08-17 MED ORDER — HYDROCODONE-HOMATROPINE 5-1.5 MG/5ML PO SYRP: 5.0000 mL | ORAL_SOLUTION | Freq: Four times a day (QID) | ORAL | Status: DC | PRN

## 2014-08-17 NOTE — Progress Notes (Signed)
Pre visit review using our clinic review tool, if applicable. No additional management support is needed unless otherwise documented below in the visit note. 

## 2014-08-17 NOTE — Progress Notes (Signed)
Subjective:    Patient ID: Jessica [name], female    DOB: Jun 12, 1976, 39 y.o.   MRN: [id number]  HPI  39 year old patient who has a complex cardiac history with congenital heart disease.  She is status post mitral and aortic valve repair and also TV  reconstruction.  5 days ago, she completed Augmentin for suspected sinus infection.  She is seen today in follow-up.  She is still having cough, which is largely nonproductive.  She occasionally expectorates small amounts of clear sputum.  She continues to have some mild sinus congestion and rhinorrhea.  No fever, shortness of breath or wheezing.  She does have a history of chronic systolic heart failure and recent BNP normal.  Clinical exam of the chest  have general revealed clear lungs.  Denies any fever or chills.  She states she does have a prior history of community acquired pneumonia without fever in the past.  Remains on chronic Coumadin anticoagulation  Past Medical History  Diagnosis Date  . 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 Migraine   . Depression   . Hepatitis C   . Atrioventricular canal (AVC), complete     Has had 7 sternotomies for repair of this  . Acute on chronic heart failure     periprosthetic mitral valve leak  . H/O mitral valve replacement     a. x 3 -  most recent 06/2011 - St[employer] Jude MVR  . History of aortic valve replacement     a. 63mm SJM 1995  . H/O maze procedure     06/2011  . History of tricuspid valve repair     06/2011  . Cancer 06/2011  . Dyspareunia   . HPV (human papilloma virus) infection 2006  . AVNRT (AV nodal re-entry tachycardia)   . B12 deficiency 03/09/2010    Associated paravalvular leak     History   Social History  . Marital Status: Single    Spouse Name: N/A    Number of Children: N/A  . Years of Education: N/A   Occupational History  . legal specialist    Social History Main Topics  . Smoking status: Never Smoker   . Smokeless tobacco: Never  Used  . Alcohol Use: No  . Drug Use: No  . Sexual Activity:    Partners: Male    Birth Control/ Protection: Pill     Comment: Necon 1/35   Other Topics Concern  . Not on file   Social History Narrative    Past Surgical History  Procedure Laterality Date  . Aorto ventricular canal defect repair      age 70 mths  . Mitral valve replacement  06/2011    Third mitral valve replacement  . Aortic valve replacement    . Sternotomy      She has had 7 sternotomy  . Cardioversion  08/17/2011    Procedure: CARDIOVERSION;  Surgeon: Carlena Bjornstad, MD;  Location: [place];  Service: Cardiovascular;  Laterality: N/A;  . Cardioversion  09/11/2011    Procedure: CARDIOVERSION;  Surgeon: Fay Records, MD;  Location: [place];  Service: Cardiovascular;  Laterality: N/A;  Carioversion  . Cardioversion  09/29/2011    Procedure: CARDIOVERSION;  Surgeon: Thayer Headings, MD;  Location: [place];  Service: Cardiovascular;  Laterality: N/A;  . Wisdom tooth extraction  16 years ago  . Cardiac electrophysiology study and ablation Right 3/15    persisitant A flutter  .  Cardiac electrophysiology mapping and ablation  8/15    Family History  Problem Relation Age of Onset  . Colon cancer    . Colon polyps    . Hyperlipidemia Mother   . Depression Mother   . Thyroid disease Mother   . Hyperlipidemia Father   . Hypertension Father   . Thyroid disease Father   . Colon cancer Paternal Grandfather     Allergies  Allergen Reactions  . Betadine [Povidone Iodine] Rash  . Povidone-Iodine Other (See Comments)    Like sunburn. Really bad rash. Do not use for cath procedures    Current Outpatient Prescriptions on File Prior to Visit  Medication Sig Dispense Refill  . aspirin 81 MG tablet Take 81 mg by mouth daily.      . furosemide (LASIX) 40 MG tablet TAKE 1 TABLET BY MOUTH DAILY 30 tablet 5  . guaiFENesin-codeine 100-10 MG/5ML syrup Take 2.5-5 mLs by mouth every 6 (six) hours as needed for cough. 180 mL 0  .  hydrochlorothiazide (HYDRODIURIL) 50 MG tablet Take 1 tablet (50 mg total) by mouth daily. 30 tablet 12  . NECON 1/35, 28, tablet TAKE 1 TABLET BY MOUTH EVERY DAY. SKIP PLACEBOS 4 Package 3  . potassium chloride (K-DUR) 10 MEQ tablet TAKE 2 TABLETS BY MOUTH TWICE DAILY. NEEDS APPOINTMENT FOR FUTURE REFILLS 120 tablet 0  . spironolactone (ALDACTONE) 50 MG tablet Take 1 tablet (50 mg total) by mouth daily. 90 tablet 1  . warfarin (COUMADIN) 4 MG tablet TAKE AS DIRECTED BY ANTICOAGULATION CLINIC 40 tablet 3  . zaleplon (SONATA) 10 MG capsule Take 1 capsule (10 mg total) by mouth at bedtime as needed for sleep. 30 capsule 0   No current facility-administered medications on file prior to visit.    BP 140/70 mmHg  Pulse 93  Temp(Src) 98.1 F (36.7 C) (Oral)  Resp 20  Ht 5' 4.5" (1.638 m)  Wt 143 lb (64.864 kg)  BMI 24.18 kg/m2  SpO2 96%     Review of Systems  Constitutional: Negative.   HENT: Positive for congestion, postnasal drip and rhinorrhea. Negative for dental problem, hearing loss, sinus pressure, sore throat and tinnitus.   Eyes: Negative for pain, discharge and visual disturbance.  Respiratory: Positive for cough. Negative for shortness of breath and wheezing.   Cardiovascular: Negative for chest pain, palpitations and leg swelling.  Gastrointestinal: Negative for nausea, vomiting, abdominal pain, diarrhea, constipation, blood in stool and abdominal distention.  Genitourinary: Negative for dysuria, urgency, frequency, hematuria, flank pain, vaginal bleeding, vaginal discharge, difficulty urinating, vaginal pain and pelvic pain.  Musculoskeletal: Negative for joint swelling, arthralgias and gait problem.  Skin: Negative for rash.  Neurological: Negative for dizziness, syncope, speech difficulty, weakness, numbness and headaches.  Hematological: Negative for adenopathy.  Psychiatric/Behavioral: Negative for behavioral problems, dysphoric mood and agitation. The patient is not  nervous/anxious.        Objective:   Physical Exam  Constitutional: She is oriented to person, place, and time. She appears well-developed and well-nourished.   Afebrile No distress  HENT:  Head: Normocephalic.  Right Ear: External ear normal.  Left Ear: External ear normal.  Mouth/Throat: Oropharynx is clear and moist.  Eyes: Conjunctivae and EOM are normal. Pupils are equal, round, and reactive to light.  Neck: Normal range of motion. Neck supple. No thyromegaly present.  Cardiovascular: Normal rate, regular rhythm and intact distal pulses.   Murmur heard. Loud prosthetic heart sounds Rhythm is regular   Pulmonary/Chest: Effort normal.  Scattered rales and a few rhonchi involving both lower lung fields  Abdominal: Soft. Bowel sounds are normal. She exhibits no mass. There is no tenderness.  Musculoskeletal: Normal range of motion.  Lymphadenopathy:    She has no cervical adenopathy.  Neurological: She is alert and oriented to person, place, and time.  Skin: Skin is warm and dry. No rash noted.  Psychiatric: She has a normal mood and affect. Her behavior is normal.          Assessment & Plan:   Resolving URI.  Will check a chest x-ray to rule out pneumonia, although very little clinically, suggest this other than her clinical exam. Congenital heart disease, status post aVR, MVR History of hypokalemia.  Will check a follow-up potassium level as well as a CBC  We'll continue symptomatic treatment

## 2014-08-17 NOTE — Patient Instructions (Signed)
Take over-the-counter expectorants and cough medications such as  Mucinex DM.  Call if there is no improvement in 5 to 7 days or if  you develop worsening cough, fever, or new symptoms, such as shortness of breath or chest pain. 

## 2014-08-27 ENCOUNTER — Telehealth: Payer: Self-pay | Admitting: General Practice

## 2014-08-27 ENCOUNTER — Encounter: Payer: Self-pay | Admitting: Internal Medicine

## 2014-08-27 ENCOUNTER — Ambulatory Visit (INDEPENDENT_AMBULATORY_CARE_PROVIDER_SITE_OTHER): Payer: BLUE CROSS/BLUE SHIELD | Admitting: Internal Medicine

## 2014-08-27 ENCOUNTER — Ambulatory Visit (INDEPENDENT_AMBULATORY_CARE_PROVIDER_SITE_OTHER): Payer: BLUE CROSS/BLUE SHIELD | Admitting: General Practice

## 2014-08-27 ENCOUNTER — Telehealth: Payer: Self-pay

## 2014-08-27 VITALS — BP 110/70 | HR 87 | Temp 98.9°F | Resp 20 | Ht 64.5 in | Wt 140.0 lb

## 2014-08-27 DIAGNOSIS — D6859 Other primary thrombophilia: Secondary | ICD-10-CM

## 2014-08-27 DIAGNOSIS — D6852 Prothrombin gene mutation: Secondary | ICD-10-CM

## 2014-08-27 DIAGNOSIS — J069 Acute upper respiratory infection, unspecified: Secondary | ICD-10-CM

## 2014-08-27 DIAGNOSIS — Z5181 Encounter for therapeutic drug level monitoring: Secondary | ICD-10-CM

## 2014-08-27 DIAGNOSIS — Z954 Presence of other heart-valve replacement: Secondary | ICD-10-CM

## 2014-08-27 DIAGNOSIS — Z952 Presence of prosthetic heart valve: Secondary | ICD-10-CM

## 2014-08-27 DIAGNOSIS — B9789 Other viral agents as the cause of diseases classified elsewhere: Principal | ICD-10-CM

## 2014-08-27 LAB — PROTIME-INR
INR: 6.7 ratio — AB (ref 0.8–1.0)
Prothrombin Time: 70.8 s (ref 9.6–13.1)

## 2014-08-27 MED ORDER — HYDROCODONE-HOMATROPINE 5-1.5 MG/5ML PO SYRP: 5.0000 mL | ORAL_SOLUTION | Freq: Four times a day (QID) | ORAL | Status: DC | PRN

## 2014-08-27 NOTE — Progress Notes (Signed)
Subjective:    Patient ID: Jessica [name], female    DOB: 07/04/1976, 39 y.o.   MRN: [id number]  HPI  39 year old patient, history of mitral and aortic valve replacement and LV dysfunction.  She has been seen recently for a URI and earlier was treated for early sinusitis with Augmentin for 7 days.  She generally feels well except for persistent nonproductive cough.  She is on Coumadin anticoagulation and INR today.  7.  Cough interferes with sleep.  Denies any shortness of breath, fever or other constitutional complaints.  A chest x-ray was done recently that revealed no acute cardiopulmonary disease.  Past Medical History  Diagnosis Date  . 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 Migraine   . Depression   . Hepatitis C   . Atrioventricular canal (AVC), complete     Has had 7 sternotomies for repair of this  . Acute on chronic heart failure     periprosthetic mitral valve leak  . H/O mitral valve replacement     a. x 3 -  most recent 06/2011 - [employer] Jude MVR  . History of aortic valve replacement     a. 63mm SJM 1995  . H/O maze procedure     06/2011  . History of tricuspid valve repair     06/2011  . Cancer 06/2011  . Dyspareunia   . HPV (human papilloma virus) infection 2006  . AVNRT (AV nodal re-entry tachycardia)   . B12 deficiency 03/09/2010    Associated paravalvular leak     History   Social History  . Marital Status: Single    Spouse Name: N/A    Number of Children: N/A  . Years of Education: N/A   Occupational History  . legal specialist    Social History Main Topics  . Smoking status: Never Smoker   . Smokeless tobacco: Never Used  . Alcohol Use: No  . Drug Use: No  . Sexual Activity:    Partners: Male    Birth Control/ Protection: Pill     Comment: Necon 1/35   Other Topics Concern  . Not on file   Social History Narrative    Past Surgical History  Procedure Laterality Date  . Aorto ventricular canal defect repair       age 39 mths  . Mitral valve replacement  06/2011    Third mitral valve replacement  . Aortic valve replacement    . Sternotomy      She has had 7 sternotomy  . Cardioversion  08/17/2011    Procedure: CARDIOVERSION;  Surgeon: Carlena Bjornstad, MD;  Location: [place];  Service: Cardiovascular;  Laterality: N/A;  . Cardioversion  09/11/2011    Procedure: CARDIOVERSION;  Surgeon: Fay Records, MD;  Location: [place];  Service: Cardiovascular;  Laterality: N/A;  Carioversion  . Cardioversion  09/29/2011    Procedure: CARDIOVERSION;  Surgeon: Thayer Headings, MD;  Location: [place];  Service: Cardiovascular;  Laterality: N/A;  . Wisdom tooth extraction  16 years ago  . Cardiac electrophysiology study and ablation Right 3/15    persisitant A flutter  . Cardiac electrophysiology mapping and ablation  8/15    Family History  Problem Relation Age of Onset  . Colon cancer    . Colon polyps    . Hyperlipidemia Mother   . Depression Mother   . Thyroid disease Mother   . Hyperlipidemia Father   . Hypertension Father   .  Thyroid disease Father   . Colon cancer Paternal Grandfather     Allergies  Allergen Reactions  . Betadine [Povidone Iodine] Rash  . Povidone-Iodine Other (See Comments)    Like sunburn. Really bad rash. Do not use for cath procedures    Current Outpatient Prescriptions on File Prior to Visit  Medication Sig Dispense Refill  . aspirin 81 MG tablet Take 81 mg by mouth daily.      . DULoxetine (CYMBALTA) 20 MG capsule Take 40 mg by mouth daily.   3  . furosemide (LASIX) 40 MG tablet TAKE 1 TABLET BY MOUTH DAILY 30 tablet 5  . guaiFENesin-codeine 100-10 MG/5ML syrup Take 2.5-5 mLs by mouth every 6 (six) hours as needed for cough. 180 mL 0  . hydrochlorothiazide (HYDRODIURIL) 50 MG tablet Take 1 tablet (50 mg total) by mouth daily. 30 tablet 12  . NECON 1/35, 28, tablet TAKE 1 TABLET BY MOUTH EVERY DAY. SKIP PLACEBOS 4 Package 3  . potassium chloride (K-DUR) 10 MEQ tablet TAKE 2  TABLETS BY MOUTH TWICE DAILY. NEEDS APPOINTMENT FOR FUTURE REFILLS 120 tablet 0  . spironolactone (ALDACTONE) 50 MG tablet Take 1 tablet (50 mg total) by mouth daily. 90 tablet 1  . warfarin (COUMADIN) 4 MG tablet TAKE AS DIRECTED BY ANTICOAGULATION CLINIC 40 tablet 3  . zaleplon (SONATA) 10 MG capsule Take 1 capsule (10 mg total) by mouth at bedtime as needed for sleep. 30 capsule 0   No current facility-administered medications on file prior to visit.    BP 110/70 mmHg  Pulse 87  Temp(Src) 98.9 F (37.2 C) (Oral)  Resp 20  Ht 5' 4.5" (1.638 m)  Wt 140 lb (63.504 kg)  BMI 23.67 kg/m2  SpO2 98%     Review of Systems  Constitutional: Negative.   HENT: Negative for congestion, dental problem, hearing loss, rhinorrhea, sinus pressure, sore throat and tinnitus.   Eyes: Negative for pain, discharge and visual disturbance.  Respiratory: Positive for cough. Negative for shortness of breath.   Cardiovascular: Negative for chest pain, palpitations and leg swelling.  Gastrointestinal: Negative for nausea, vomiting, abdominal pain, diarrhea, constipation, blood in stool and abdominal distention.  Genitourinary: Negative for dysuria, urgency, frequency, hematuria, flank pain, vaginal bleeding, vaginal discharge, difficulty urinating, vaginal pain and pelvic pain.  Musculoskeletal: Negative for joint swelling, arthralgias and gait problem.  Skin: Negative for rash.  Neurological: Negative for dizziness, syncope, speech difficulty, weakness, numbness and headaches.  Hematological: Negative for adenopathy.  Psychiatric/Behavioral: Negative for behavioral problems, dysphoric mood and agitation. The patient is not nervous/anxious.        Objective:   Physical Exam  Constitutional: She is oriented to person, place, and time. She appears well-developed and well-nourished. No distress.  HENT:  Head: Normocephalic.  Right Ear: External ear normal.  Left Ear: External ear normal.  Mouth/Throat:  Oropharynx is clear and moist.  Eyes: Conjunctivae and EOM are normal. Pupils are equal, round, and reactive to light.  Neck: Normal range of motion. Neck supple. No thyromegaly present.  Cardiovascular: Normal rate, regular rhythm and intact distal pulses.   Murmur heard. Loud prosthetic heart sounds Grade 1-9/4 systolic murmur loudest at the base Grade 2/6 diastolic murmur loudest at the left upper sternal border  Pulmonary/Chest: Effort normal and breath sounds normal.  Abdominal: Soft. Bowel sounds are normal. She exhibits no mass. There is no tenderness.  Musculoskeletal: Normal range of motion.  Lymphadenopathy:    She has no cervical adenopathy.  Neurological: She  is alert and oriented to person, place, and time.  Skin: Skin is warm and dry. No rash noted.  Psychiatric: She has a normal mood and affect. Her behavior is normal.          Assessment & Plan:   Resolving URI.  Will continue efforts at symptom control.  Will call if there is any clinical deterioration Elevated INR.  Coumadin on hold Status post aortic and mitral valve replacement LV dysfunction.  Follow-up cardiology

## 2014-08-27 NOTE — Telephone Encounter (Signed)
ICritical Lab:  NR is 6.7 and PT is 70.8

## 2014-08-27 NOTE — Telephone Encounter (Signed)
Spoke with patient and instructed her to hold coumadin through Sunday 2/7.  Patient to have INR re-checked on Monday 2/8.  Instructed patient to go to emergency room if she experiences any unusual bleeding or bruising.  Patient verbalized understanding.

## 2014-08-27 NOTE — Progress Notes (Signed)
Pre visit review using our clinic review tool, if applicable. No additional management support is needed unless otherwise documented below in the visit note. 

## 2014-08-27 NOTE — Patient Instructions (Addendum)
DIAGNOSIS  Acute bronchitis is usually diagnosed through a physical exam. Your health care provider will also ask you questions about your medical history. Tests, such as chest X-rays, are sometimes done to rule out other conditions.  TREATMENT  Acute bronchitis usually goes away in a couple weeks. Oftentimes, no medical treatment is necessary. Medicines are sometimes given for relief of fever or cough. Antibiotic medicines are usually not needed but may be prescribed in certain situations. In some cases, an inhaler may be recommended to help reduce shortness of breath and control the cough. A cool mist vaporizer may also be used to help thin bronchial secretions and make it easier to clear the chest.  HOME CARE INSTRUCTIONS  Get plenty of rest.  Drink enough fluids to keep your urine clear or pale yellow (unless you have a medical condition that requires fluid restriction). Increasing fluids may help thin your respiratory secretions (sputum) and reduce chest congestion, and it will prevent dehydration.  Take medicines only as directed by your health care provider.  If you were prescribed an antibiotic medicine, finish it all even if you start to feel better.  Avoid smoking and secondhand smoke. Exposure to cigarette smoke or irritating chemicals will make bronchitis worse. If you are a smoker, consider using nicotine gum or skin patches to help control withdrawal symptoms. Quitting smoking will help your lungs heal faster.  Reduce the chances of another bout of acute bronchitis by washing your hands frequently, avoiding people with cold symptoms, and trying not to touch your hands to your mouth, nose, or eyes.  Keep all follow-up visits as directed by your health care provider.   Acute bronchitis symptoms are generally not helped by antibiotics.  Take over-the-counter expectorants and cough medications such as  Mucinex DM.  Call if there is no improvement in 5 to 7 days or if  you develop worsening  cough, fever, or new symptoms, such as shortness of breath or chest pain.

## 2014-08-31 ENCOUNTER — Ambulatory Visit (INDEPENDENT_AMBULATORY_CARE_PROVIDER_SITE_OTHER): Payer: BLUE CROSS/BLUE SHIELD | Admitting: General Practice

## 2014-08-31 DIAGNOSIS — Z5181 Encounter for therapeutic drug level monitoring: Secondary | ICD-10-CM

## 2014-08-31 DIAGNOSIS — D6859 Other primary thrombophilia: Secondary | ICD-10-CM

## 2014-08-31 DIAGNOSIS — I059 Rheumatic mitral valve disease, unspecified: Secondary | ICD-10-CM

## 2014-08-31 DIAGNOSIS — D6852 Prothrombin gene mutation: Secondary | ICD-10-CM

## 2014-08-31 DIAGNOSIS — Z952 Presence of prosthetic heart valve: Secondary | ICD-10-CM

## 2014-08-31 DIAGNOSIS — I4891 Unspecified atrial fibrillation: Secondary | ICD-10-CM

## 2014-08-31 DIAGNOSIS — I359 Nonrheumatic aortic valve disorder, unspecified: Secondary | ICD-10-CM

## 2014-08-31 DIAGNOSIS — Z954 Presence of other heart-valve replacement: Secondary | ICD-10-CM

## 2014-08-31 LAB — POCT INR: INR: 1.8

## 2014-08-31 NOTE — Progress Notes (Signed)
Pre visit review using our clinic review tool, if applicable. No additional management support is needed unless otherwise documented below in the visit note. 

## 2014-09-02 ENCOUNTER — Other Ambulatory Visit: Payer: Self-pay | Admitting: Family Medicine

## 2014-09-07 ENCOUNTER — Ambulatory Visit (INDEPENDENT_AMBULATORY_CARE_PROVIDER_SITE_OTHER): Payer: BLUE CROSS/BLUE SHIELD | Admitting: General Practice

## 2014-09-07 DIAGNOSIS — Z952 Presence of prosthetic heart valve: Secondary | ICD-10-CM

## 2014-09-07 DIAGNOSIS — Z5181 Encounter for therapeutic drug level monitoring: Secondary | ICD-10-CM

## 2014-09-07 DIAGNOSIS — Z954 Presence of other heart-valve replacement: Secondary | ICD-10-CM

## 2014-09-07 DIAGNOSIS — D6852 Prothrombin gene mutation: Secondary | ICD-10-CM

## 2014-09-07 DIAGNOSIS — D6859 Other primary thrombophilia: Secondary | ICD-10-CM

## 2014-09-07 LAB — POCT INR: INR: 3.1

## 2014-09-07 NOTE — Progress Notes (Signed)
Pre visit review using our clinic review tool, if applicable. No additional management support is needed unless otherwise documented below in the visit note. 

## 2014-10-08 ENCOUNTER — Ambulatory Visit (INDEPENDENT_AMBULATORY_CARE_PROVIDER_SITE_OTHER): Payer: BLUE CROSS/BLUE SHIELD | Admitting: General Practice

## 2014-10-08 DIAGNOSIS — Z952 Presence of prosthetic heart valve: Secondary | ICD-10-CM

## 2014-10-08 DIAGNOSIS — Z954 Presence of other heart-valve replacement: Secondary | ICD-10-CM

## 2014-10-08 DIAGNOSIS — Z5181 Encounter for therapeutic drug level monitoring: Secondary | ICD-10-CM | POA: Diagnosis not present

## 2014-10-08 DIAGNOSIS — D6852 Prothrombin gene mutation: Secondary | ICD-10-CM

## 2014-10-08 DIAGNOSIS — D6859 Other primary thrombophilia: Secondary | ICD-10-CM

## 2014-10-08 LAB — POCT INR: INR: 2.2

## 2014-10-08 NOTE — Progress Notes (Signed)
Pre visit review using our clinic review tool, if applicable. No additional management support is needed unless otherwise documented below in the visit note. 

## 2014-10-15 ENCOUNTER — Encounter: Payer: Self-pay | Admitting: Family Medicine

## 2014-10-15 ENCOUNTER — Ambulatory Visit (INDEPENDENT_AMBULATORY_CARE_PROVIDER_SITE_OTHER): Payer: BLUE CROSS/BLUE SHIELD | Admitting: Family Medicine

## 2014-10-15 DIAGNOSIS — E785 Hyperlipidemia, unspecified: Secondary | ICD-10-CM

## 2014-10-15 DIAGNOSIS — F32A Depression, unspecified: Secondary | ICD-10-CM

## 2014-10-15 DIAGNOSIS — F329 Major depressive disorder, single episode, unspecified: Secondary | ICD-10-CM | POA: Diagnosis not present

## 2014-10-15 DIAGNOSIS — I5022 Chronic systolic (congestive) heart failure: Secondary | ICD-10-CM

## 2014-10-15 DIAGNOSIS — B1920 Unspecified viral hepatitis C without hepatic coma: Secondary | ICD-10-CM | POA: Diagnosis not present

## 2014-10-15 DIAGNOSIS — Q249 Congenital malformation of heart, unspecified: Secondary | ICD-10-CM | POA: Diagnosis not present

## 2014-10-15 NOTE — Patient Instructions (Addendum)
Come back for future fasting labs.  LFTs Lipids Hep C  TSH  Let's check in a year from now unless you need me

## 2014-10-15 NOTE — Progress Notes (Signed)
Garret Reddish, MD Phone: [phone]  Subjective:  Patient presents today to establish care with me as their new primary care provider. Patient was formerly a patient of Dr. Arnoldo Morale. Chief complaint-noted.   Hepatitis C- chronic or carrier presumed stable Acquired through transfusion during one of her many heart surgeries. Was followed by local hepatologist but does not want to continue. Last RNA > 1 year ago. Presumed she is thought to be chronic. Last levels of RNA not detectable.   HIV screening multiple times in past including before starting relationship current boyfriend and he was screened as well around 2012.  ROS- no RUQ pain, no nausea, edema  Hyperlipidemia-mild poor control on last check but had LFT elevation on statin  Lab Results  Component Value Date   LDLCALC 118* 11/16/2009   On statin: no Regular exercise: active through dance Diet: eats reasonably and maintains weight.  ROS- no chest pain or shortness of breath. No myalgias  Congenital heart disease- stable and followed at [facility] as well Continues to avoid nsaids as instructed. She is highly knowledgeable about her disease process. She is tolerating coumadin for mitral and aortic valve replacement and follows up in our clinic for this.  ROS- no chest pain or shortness of breath  Depression-controlled on cymbalta 60mg  -tries to help control symptoms with humor and dance. Sees psychiatry and also a Social worker.  ROS- No SI/HI  The following were reviewed and entered/updated in epic: Past Medical History  Diagnosis Date  . 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 Migraine   . Depression   . Hepatitis C   . Atrioventricular canal (AVC), complete     Has had 7 sternotomies for repair of this  . Acute on chronic heart failure     periprosthetic mitral valve leak  . H/O mitral valve replacement     a. x 3 -  most recent 06/2011 - St. Jude MVR  . History of aortic valve replacement     a. 79mm SJM 1995  . H/O maze procedure     06/2011  . History of tricuspid valve repair     06/2011  . Cancer 06/2011  . Dyspareunia   . HPV (human papilloma virus) infection 2006    follows GYN.   Marland Kitchen AVNRT (AV nodal re-entry tachycardia)   . B12 deficiency 03/09/2010    Associated paravalvular leak    Patient Active Problem List   Diagnosis Date Noted  . Chronic systolic congestive heart failure 08/06/2014    Priority: High  . Arrhythmia 12/08/2010    Priority: High  . Congenital heart disease 06/26/2008    Priority: High  . MITRAL VALVE REPLACEMENT, HX OF 02/11/2007    Priority: High  . History of aortic valve replacement 02/11/2007    Priority: High  . Depression 08/06/2014    Priority: Medium  . Insomnia 08/06/2014    Priority: Medium  . ABNORMAL TRANSAMINASE-LFT'S 06/26/2008    Priority: Medium  . Hyperlipidemia 06/08/2008    Priority: Medium  . Migraine 02/11/2007    Priority: Medium  . Encounter for therapeutic drug monitoring 09/01/2013    Priority: Low  . Primary hypercoagulable state 07/27/2011    Priority: Low  . Acquired hemolytic anemia 11/29/2009    Priority: Low  . Hepatitis C    Past Surgical History  Procedure Laterality Date  . Aorto ventricular canal defect repair      age 89 mths  . Mitral valve replacement  06/2011    Third mitral valve replacement  . Aortic valve replacement    . Sternotomy      She has had 7 sternotomy  . Cardioversion  08/17/2011    Procedure: CARDIOVERSION;  Surgeon: Carlena Bjornstad, MD;  Location: [place];  Service: Cardiovascular;  Laterality: N/A;  . Cardioversion  09/11/2011    Procedure: CARDIOVERSION;  Surgeon: Fay Records, MD;  Location: [facility];  Service: Cardiovascular;  Laterality: N/A;  Carioversion  . Cardioversion  09/29/2011    Procedure: CARDIOVERSION;  Surgeon: Thayer Headings, MD;  Location: [place];  Service: Cardiovascular;  Laterality: N/A;  . Wisdom tooth extraction  16 years ago  . Cardiac electrophysiology  study and ablation Right 3/15    persisitant A flutter  . Cardiac electrophysiology mapping and ablation  8/15  . Wrist surgery      bilaterally torn cartilage    Family History  Problem Relation Age of Onset  . Colon polyps Father   . Hyperlipidemia Mother   . Depression Mother   . Thyroid disease Mother   . Hyperlipidemia Father   . Hypertension Father   . Colon cancer Paternal Grandmother     Medications- reviewed and updated Current Outpatient Prescriptions  Medication Sig Dispense Refill  . aspirin 81 MG tablet Take 81 mg by mouth daily.      . DULoxetine (CYMBALTA) 20 MG capsule Take 40 mg by mouth daily.   3  . furosemide (LASIX) 40 MG tablet TAKE 1 TABLET BY MOUTH DAILY 30 tablet 5  . hydrochlorothiazide (HYDRODIURIL) 50 MG tablet Take 1 tablet (50 mg total) by mouth daily. 30 tablet 12  . NECON 1/35, 28, tablet TAKE 1 TABLET BY MOUTH EVERY DAY. SKIP PLACEBOS 4 Package 3  . potassium chloride (K-DUR) 10 MEQ tablet Take 2 tablets by mouth twice daily. 120 tablet 1  . spironolactone (ALDACTONE) 50 MG tablet Take 1 tablet (50 mg total) by mouth daily. 90 tablet 1  . warfarin (COUMADIN) 4 MG tablet TAKE AS DIRECTED BY ANTICOAGULATION CLINIC 40 tablet 3  . zaleplon (SONATA) 10 MG capsule Take 1 capsule (10 mg total) by mouth at bedtime as needed for sleep. (Patient not taking: Reported on 10/15/2014) 30 capsule 0     Allergies-reviewed and updated Allergies  Allergen Reactions  . Betadine [Povidone Iodine] Rash  . Povidone-Iodine Other (See Comments)    Like sunburn. Really bad rash. Do not use for cath procedures    History   Social History  . Marital Status: Single    Spouse Name: N/A  . Number of Children: N/A  . Years of Education: N/A   Occupational History  . legal specialist    Social History Main Topics  . Smoking status: Never Smoker   . Smokeless tobacco: Never Used  . Alcohol Use: No  . Drug Use: No  . Sexual Activity:    Partners: Male    Birth  Control/ Protection: Pill     Comment: Necon 1/35   Other Topics Concern  . None   Social History Narrative   Boyfriend of 4 years. Has been told not to have children. No plans for adoption.       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       Hobbies: dancing    ROS--See HPI   Objective: BP 112/70 mmHg  Pulse 85  Temp(Src) 98.6 F (37 C)  Wt 140 lb (63.504 kg) Gen: NAD, resting  comfortably CV: Irregular at times regular at others, mechanical click, valvular click heard without stethoscope Lungs: CTAB no crackles, wheeze, rhonchi Abdomen: soft/nontender/nondistended/normal bowel sounds.  Ext: no edema Skin: warm, dry, no rash   Assessment/Plan:  Hepatitis C Acquired through blood transfusion. Saw local hepatologist and does not want to continue. She is aware of risks. We will plan to check RNA yearly including at this time.    Hyperlipidemia Mild poor control previously. Will check lipids. LFT elevation on last statin. Perhaps could consider weekly burst therapy if needed. Would also consider consulting with her cardiologist at [facility] before starting as I have limited knowledge base for primary prevention MI in congenital heart disease.    Depression tries to help control symptoms with humor and dance. Sees psychiatry and also a Social worker. cymbalta through psychiatry.    Congenital heart disease Doing well. Continue coumadin.    1 year follow up or prn  Future fasting labs Orders Placed This Encounter  Procedures  . Lipid panel    [place]    Standing Status: Future     Number of Occurrences:      Standing Expiration Date: 10/15/2015    Order Specific Question:  Has the patient fasted?    Answer:  No  . Hepatic Function Panel    Standing Status: Future     Number of Occurrences:      Standing Expiration Date: 10/15/2015  . Hep C RNA, Quant    Standing Status: Future     Number of Occurrences:      Standing Expiration Date: 10/15/2015   . TSH    [place]    Standing Status: Future     Number of Occurrences:      Standing Expiration Date: 10/15/2015

## 2014-10-16 NOTE — Assessment & Plan Note (Signed)
Acquired through blood transfusion. Saw local hepatologist and does not want to continue. She is aware of risks. We will plan to check RNA yearly including at this time.

## 2014-10-16 NOTE — Assessment & Plan Note (Signed)
Doing well. Continue coumadin.

## 2014-10-16 NOTE — Assessment & Plan Note (Addendum)
Mild poor control previously. Will check lipids. LFT elevation on last statin. Perhaps could consider weekly burst therapy if needed. Would also consider consulting with her cardiologist at [facility] before starting as I have limited knowledge base for primary prevention MI in congenital heart disease.

## 2014-10-16 NOTE — Assessment & Plan Note (Signed)
tries to help control symptoms with humor and dance. Sees psychiatry and also a Social worker. cymbalta through psychiatry.

## 2014-11-05 ENCOUNTER — Ambulatory Visit (INDEPENDENT_AMBULATORY_CARE_PROVIDER_SITE_OTHER): Payer: BLUE CROSS/BLUE SHIELD | Admitting: General Practice

## 2014-11-05 DIAGNOSIS — Z954 Presence of other heart-valve replacement: Secondary | ICD-10-CM | POA: Diagnosis not present

## 2014-11-05 DIAGNOSIS — D6852 Prothrombin gene mutation: Secondary | ICD-10-CM

## 2014-11-05 DIAGNOSIS — Z5181 Encounter for therapeutic drug level monitoring: Secondary | ICD-10-CM

## 2014-11-05 DIAGNOSIS — D6859 Other primary thrombophilia: Secondary | ICD-10-CM

## 2014-11-05 DIAGNOSIS — Z952 Presence of prosthetic heart valve: Secondary | ICD-10-CM

## 2014-11-05 LAB — POCT INR: INR: 3.1

## 2014-11-05 NOTE — Progress Notes (Signed)
Pre visit review using our clinic review tool, if applicable. No additional management support is needed unless otherwise documented below in the visit note. 

## 2014-11-12 ENCOUNTER — Other Ambulatory Visit: Payer: Self-pay | Admitting: Internal Medicine

## 2014-11-19 ENCOUNTER — Other Ambulatory Visit: Payer: Self-pay | Admitting: Family Medicine

## 2014-11-20 ENCOUNTER — Telehealth: Payer: Self-pay | Admitting: Family Medicine

## 2014-11-20 NOTE — Telephone Encounter (Signed)
Pt needs refill on warfarin 4 mg #90 w/refills [facility] to [facility] Pt is out

## 2014-11-20 NOTE — Telephone Encounter (Signed)
Medication refilled

## 2014-12-03 ENCOUNTER — Ambulatory Visit (INDEPENDENT_AMBULATORY_CARE_PROVIDER_SITE_OTHER): Payer: BLUE CROSS/BLUE SHIELD | Admitting: General Practice

## 2014-12-03 DIAGNOSIS — I4891 Unspecified atrial fibrillation: Secondary | ICD-10-CM

## 2014-12-03 DIAGNOSIS — I059 Rheumatic mitral valve disease, unspecified: Secondary | ICD-10-CM

## 2014-12-03 DIAGNOSIS — D6859 Other primary thrombophilia: Secondary | ICD-10-CM

## 2014-12-03 DIAGNOSIS — I359 Nonrheumatic aortic valve disorder, unspecified: Secondary | ICD-10-CM

## 2014-12-03 DIAGNOSIS — Z952 Presence of prosthetic heart valve: Secondary | ICD-10-CM

## 2014-12-03 DIAGNOSIS — Z954 Presence of other heart-valve replacement: Secondary | ICD-10-CM

## 2014-12-03 DIAGNOSIS — Z5181 Encounter for therapeutic drug level monitoring: Secondary | ICD-10-CM

## 2014-12-03 DIAGNOSIS — D6852 Prothrombin gene mutation: Secondary | ICD-10-CM

## 2014-12-03 LAB — POCT INR: INR: 2.6

## 2014-12-03 NOTE — Progress Notes (Signed)
Pre visit review using our clinic review tool, if applicable. No additional management support is needed unless otherwise documented below in the visit note. 

## 2014-12-08 ENCOUNTER — Telehealth: Payer: Self-pay | Admitting: Internal Medicine

## 2014-12-08 DIAGNOSIS — Q249 Congenital malformation of heart, unspecified: Secondary | ICD-10-CM

## 2014-12-08 DIAGNOSIS — I499 Cardiac arrhythmia, unspecified: Secondary | ICD-10-CM

## 2014-12-08 DIAGNOSIS — I5022 Chronic systolic (congestive) heart failure: Secondary | ICD-10-CM

## 2014-12-08 NOTE — Telephone Encounter (Signed)
Patinet sent email  She has had a few episodes of palpations  Concerning for atrial flutter Should be set up for event monitor to pic up

## 2014-12-09 ENCOUNTER — Telehealth: Payer: Self-pay | Admitting: *Deleted

## 2014-12-09 NOTE — Telephone Encounter (Signed)
Attempted to call patient to notify her of Dr. Harrington Challenger' recommendation of Event Monitor. LMTCB 5/18. Routing to [facility] and [facility] also so that they are aware of new order for the Event Monitor.

## 2014-12-09 NOTE — Telephone Encounter (Signed)
Event monitor ordered and message routed to [facility] and Holter/Treadmill.

## 2014-12-09 NOTE — Telephone Encounter (Signed)
Patient called back and stated that after thinking it over she has decided that the Event Monitor is "probably the way to go". She is agreeable to having the Event Monitor. [facility] is scheduling her at this time on the phone.

## 2014-12-09 NOTE — Addendum Note (Signed)
Addended by: Toni Amend D on: 12/09/2014 11:40 AM   Modules accepted: Orders

## 2014-12-09 NOTE — Telephone Encounter (Signed)
-----   Message from Emmaline Life, RN sent at 12/09/2014  2:48 PM EDT ----- Regarding: FW: MONITOR   ----- Message -----    From: Lynett Fish    Sent: 12/09/2014   2:31 PM      To: Emmaline Life, RN Subject: MONITOR                                        Patient called to schedule monitor, as I proceeded to schedule she informed me she only wants a 24 monitor.   Please advise? If so I need a new order in the system.

## 2014-12-10 ENCOUNTER — Ambulatory Visit (INDEPENDENT_AMBULATORY_CARE_PROVIDER_SITE_OTHER): Payer: BLUE CROSS/BLUE SHIELD

## 2014-12-10 DIAGNOSIS — I5022 Chronic systolic (congestive) heart failure: Secondary | ICD-10-CM | POA: Diagnosis not present

## 2014-12-10 DIAGNOSIS — Q249 Congenital malformation of heart, unspecified: Secondary | ICD-10-CM | POA: Diagnosis not present

## 2014-12-10 DIAGNOSIS — I499 Cardiac arrhythmia, unspecified: Secondary | ICD-10-CM

## 2014-12-15 NOTE — Telephone Encounter (Signed)
Per EPIC- event monitor placed on 12/10/14.

## 2014-12-27 ENCOUNTER — Other Ambulatory Visit: Payer: Self-pay | Admitting: Family Medicine

## 2014-12-31 ENCOUNTER — Ambulatory Visit (INDEPENDENT_AMBULATORY_CARE_PROVIDER_SITE_OTHER): Payer: BLUE CROSS/BLUE SHIELD | Admitting: General Practice

## 2014-12-31 DIAGNOSIS — I359 Nonrheumatic aortic valve disorder, unspecified: Secondary | ICD-10-CM | POA: Diagnosis not present

## 2014-12-31 DIAGNOSIS — I059 Rheumatic mitral valve disease, unspecified: Secondary | ICD-10-CM | POA: Diagnosis not present

## 2014-12-31 DIAGNOSIS — Z954 Presence of other heart-valve replacement: Secondary | ICD-10-CM

## 2014-12-31 DIAGNOSIS — I4891 Unspecified atrial fibrillation: Secondary | ICD-10-CM

## 2014-12-31 DIAGNOSIS — Z952 Presence of prosthetic heart valve: Secondary | ICD-10-CM

## 2014-12-31 DIAGNOSIS — D6859 Other primary thrombophilia: Secondary | ICD-10-CM

## 2014-12-31 DIAGNOSIS — D6852 Prothrombin gene mutation: Secondary | ICD-10-CM

## 2014-12-31 DIAGNOSIS — Z5181 Encounter for therapeutic drug level monitoring: Secondary | ICD-10-CM

## 2014-12-31 LAB — POCT INR: INR: 2.6

## 2014-12-31 NOTE — Progress Notes (Signed)
Pre visit review using our clinic review tool, if applicable. No additional management support is needed unless otherwise documented below in the visit note. 

## 2015-01-10 ENCOUNTER — Other Ambulatory Visit: Payer: Self-pay | Admitting: Internal Medicine

## 2015-01-28 ENCOUNTER — Ambulatory Visit (INDEPENDENT_AMBULATORY_CARE_PROVIDER_SITE_OTHER): Payer: BLUE CROSS/BLUE SHIELD | Admitting: General Practice

## 2015-01-28 DIAGNOSIS — Z5181 Encounter for therapeutic drug level monitoring: Secondary | ICD-10-CM

## 2015-01-28 DIAGNOSIS — D6859 Other primary thrombophilia: Secondary | ICD-10-CM

## 2015-01-28 DIAGNOSIS — Z952 Presence of prosthetic heart valve: Secondary | ICD-10-CM

## 2015-01-28 LAB — POCT INR: INR: 2.9

## 2015-01-28 NOTE — Progress Notes (Signed)
Pre visit review using our clinic review tool, if applicable. No additional management support is needed unless otherwise documented below in the visit note. 

## 2015-02-03 ENCOUNTER — Other Ambulatory Visit: Payer: Self-pay | Admitting: Family

## 2015-02-04 ENCOUNTER — Other Ambulatory Visit: Payer: Self-pay | Admitting: *Deleted

## 2015-02-04 ENCOUNTER — Telehealth: Payer: Self-pay | Admitting: Family Medicine

## 2015-02-04 MED ORDER — ZALEPLON 10 MG PO CAPS: 10.0000 mg | ORAL_CAPSULE | Freq: Every evening | ORAL | Status: DC | PRN

## 2015-02-04 NOTE — Telephone Encounter (Signed)
Rx sent in. Called patient to make aware and left message to call back.

## 2015-02-04 NOTE — Telephone Encounter (Signed)
Pt is leaving town tomorrow morning and needs refill on sonata today. [facility] [address]

## 2015-02-04 NOTE — Telephone Encounter (Signed)
Patient called back and is aware.

## 2015-02-15 ENCOUNTER — Encounter: Payer: Self-pay | Admitting: Family Medicine

## 2015-02-15 ENCOUNTER — Ambulatory Visit (INDEPENDENT_AMBULATORY_CARE_PROVIDER_SITE_OTHER): Payer: BLUE CROSS/BLUE SHIELD | Admitting: Family Medicine

## 2015-02-15 VITALS — BP 110/70 | HR 78 | Temp 99.0°F | Wt 141.0 lb

## 2015-02-15 DIAGNOSIS — I5022 Chronic systolic (congestive) heart failure: Secondary | ICD-10-CM

## 2015-02-15 DIAGNOSIS — H1033 Unspecified acute conjunctivitis, bilateral: Secondary | ICD-10-CM

## 2015-02-15 DIAGNOSIS — E785 Hyperlipidemia, unspecified: Secondary | ICD-10-CM | POA: Diagnosis not present

## 2015-02-15 DIAGNOSIS — H10023 Other mucopurulent conjunctivitis, bilateral: Secondary | ICD-10-CM

## 2015-02-15 LAB — HEPATIC FUNCTION PANEL
ALK PHOS: 66 U/L (ref 39–117)
ALT: 32 U/L (ref 0–35)
AST: 33 U/L (ref 0–37)
Albumin: 4.2 g/dL (ref 3.5–5.2)
BILIRUBIN DIRECT: 0.2 mg/dL (ref 0.0–0.3)
TOTAL PROTEIN: 7 g/dL (ref 6.0–8.3)
Total Bilirubin: 1.1 mg/dL (ref 0.2–1.2)

## 2015-02-15 LAB — LIPID PANEL
CHOLESTEROL: 179 mg/dL (ref 0–200)
HDL: 38.5 mg/dL — AB (ref >39.00)
LDL Cholesterol: 110 mg/dL — ABNORMAL HIGH (ref 0–99)
NonHDL: 140.5
Total CHOL/HDL Ratio: 5
Triglycerides: 152 mg/dL — ABNORMAL HIGH (ref 0.0–149.0)
VLDL: 30.4 mg/dL (ref 0.0–40.0)

## 2015-02-15 LAB — TSH: TSH: 1.92 u[IU]/mL (ref 0.35–4.50)

## 2015-02-15 MED ORDER — POLYMYXIN B-TRIMETHOPRIM 10000-0.1 UNIT/ML-% OP SOLN: 2.0000 [drp] | OPHTHALMIC | Status: DC

## 2015-02-15 NOTE — Addendum Note (Signed)
Addended by: Elmer Picker on: 02/15/2015 09:08 AM   Modules accepted: Orders

## 2015-02-15 NOTE — Progress Notes (Signed)
Subjective:    Patient ID: Jessica [name], female    DOB: [birth year], 39 y.o.   MRN: [id number]  HPI Acute visit. Patient seen with some mild redness and crusted drainage from both eyes right greater than left over the past couple of days. She does not wear contacts. She's not had any eye pain. No significant itching. No blurred vision.  She has history of congenital heart disease with prior aortic valve replacement and is on chronic Coumadin therapy. She plans to get labs later today regarding upcoming physical.  Past Medical History  Diagnosis Date  . 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 Migraine   . Depression   . Hepatitis C   . Atrioventricular canal (AVC), complete     Has had 7 sternotomies for repair of this  . Acute on chronic heart failure     periprosthetic mitral valve leak  . H/O mitral valve replacement     a. x 3 -  most recent 06/2011 - St. Jude MVR  . History of aortic valve replacement     a. 73mm SJM 1995  . H/O maze procedure     06/2011  . History of tricuspid valve repair     06/2011  . Cancer 06/2011  . Dyspareunia   . HPV (human papilloma virus) infection 2006    follows GYN.   Marland Kitchen AVNRT (AV nodal re-entry tachycardia)   . B12 deficiency 03/09/2010    Associated paravalvular leak    Past Surgical History  Procedure Laterality Date  . Aorto ventricular canal defect repair      age 53 mths  . Mitral valve replacement  06/2011    Third mitral valve replacement  . Aortic valve replacement    . Sternotomy      She has had 7 sternotomy  . Cardioversion  08/17/2011    Procedure: CARDIOVERSION;  Surgeon: Carlena Bjornstad, MD;  Location: [place];  Service: Cardiovascular;  Laterality: N/A;  . Cardioversion  09/11/2011    Procedure: CARDIOVERSION;  Surgeon: Fay Records, MD;  Location: [place];  Service: Cardiovascular;  Laterality: N/A;  Carioversion  . Cardioversion  09/29/2011    Procedure: CARDIOVERSION;  Surgeon: Thayer Headings,  MD;  Location: [place];  Service: Cardiovascular;  Laterality: N/A;  . Wisdom tooth extraction  16 years ago  . Cardiac electrophysiology study and ablation Right 3/15    persisitant A flutter  . Cardiac electrophysiology mapping and ablation  8/15  . Wrist surgery      bilaterally torn cartilage    reports that she has never smoked. She has never used smokeless tobacco. She reports that she does not drink alcohol or use illicit drugs. family history includes Colon cancer in her paternal grandmother; Colon polyps in her father; Depression in her mother; Hyperlipidemia in her father and mother; Hypertension in her father; Thyroid disease in her mother. Allergies  Allergen Reactions  . Betadine [Povidone Iodine] Rash  . Povidone-Iodine Other (See Comments)    Like sunburn. Really bad rash. Do not use for cath procedures      Review of Systems  Constitutional: Negative for fever and chills.  Eyes: Positive for discharge and redness. Negative for photophobia, pain, itching and visual disturbance.       Objective:   Physical Exam  Constitutional: She appears well-developed and well-nourished.  Eyes:  Both right and left conjunctiva are erythematous. She has minimal crusted drainage left upper  lid. She has minimal swelling of the left upper lid. No periorbital cellulitis changes  Cardiovascular: Normal rate.   Pulmonary/Chest: Effort normal and breath sounds normal. No respiratory distress. She has no wheezes. She has no rales.          Assessment & Plan:  Bilateral bacterial conjunctivitis. Warm compresses to both eyes several times daily. Polytrim ophthalmic drops.  Touch base in 3-4 days if not resolving

## 2015-02-15 NOTE — Patient Instructions (Signed)

## 2015-02-15 NOTE — Progress Notes (Signed)
Pre visit review using our clinic review tool, if applicable. No additional management support is needed unless otherwise documented below in the visit note. 

## 2015-02-16 LAB — HEPATITIS C RNA QUANTITATIVE: HCV Quantitative: NOT DETECTED IU/mL (ref <15)

## 2015-02-19 ENCOUNTER — Encounter: Payer: Self-pay | Admitting: Family Medicine

## 2015-02-19 NOTE — Telephone Encounter (Signed)
Jessica [name]- can you order CBC with Diff, BNP, BMP for patient and help her set up a lab visit?  CBC diff and BMP under Hyperlipidemia, B Nat Peptide under her heart failure diagnosis?

## 2015-02-22 ENCOUNTER — Telehealth: Payer: Self-pay

## 2015-02-22 DIAGNOSIS — E785 Hyperlipidemia, unspecified: Secondary | ICD-10-CM

## 2015-02-22 DIAGNOSIS — I5040 Unspecified combined systolic (congestive) and diastolic (congestive) heart failure: Secondary | ICD-10-CM

## 2015-02-22 NOTE — Telephone Encounter (Signed)
Labs entered.

## 2015-02-24 ENCOUNTER — Telehealth: Payer: Self-pay | Admitting: Obstetrics and Gynecology

## 2015-02-24 ENCOUNTER — Ambulatory Visit: Payer: 59 | Admitting: Obstetrics and Gynecology

## 2015-02-24 ENCOUNTER — Other Ambulatory Visit (INDEPENDENT_AMBULATORY_CARE_PROVIDER_SITE_OTHER): Payer: BLUE CROSS/BLUE SHIELD

## 2015-02-24 DIAGNOSIS — I5040 Unspecified combined systolic (congestive) and diastolic (congestive) heart failure: Secondary | ICD-10-CM | POA: Diagnosis not present

## 2015-02-24 DIAGNOSIS — E785 Hyperlipidemia, unspecified: Secondary | ICD-10-CM | POA: Diagnosis not present

## 2015-02-24 LAB — CBC WITH DIFFERENTIAL/PLATELET
Basophils Absolute: 0 10*3/uL (ref 0.0–0.1)
Basophils Relative: 0.4 % (ref 0.0–3.0)
Eosinophils Absolute: 0 10*3/uL (ref 0.0–0.7)
Eosinophils Relative: 0.8 % (ref 0.0–5.0)
HCT: 37.5 % (ref 36.0–46.0)
Hemoglobin: 12.5 g/dL (ref 12.0–15.0)
Lymphocytes Relative: 15.5 % (ref 12.0–46.0)
Lymphs Abs: 0.6 10*3/uL — ABNORMAL LOW (ref 0.7–4.0)
MCHC: 33.2 g/dL (ref 30.0–36.0)
MCV: 92.8 fl (ref 78.0–100.0)
MONOS PCT: 6.6 % (ref 3.0–12.0)
Monocytes Absolute: 0.3 10*3/uL (ref 0.1–1.0)
Neutro Abs: 3.1 10*3/uL (ref 1.4–7.7)
Neutrophils Relative %: 76.7 % (ref 43.0–77.0)
Platelets: 175 10*3/uL (ref 150.0–400.0)
RBC: 4.05 Mil/uL (ref 3.87–5.11)
RDW: 15.2 % (ref 11.5–15.5)
WBC: 4.1 10*3/uL (ref 4.0–10.5)

## 2015-02-24 LAB — BASIC METABOLIC PANEL
BUN: 19 mg/dL (ref 6–23)
CHLORIDE: 99 meq/L (ref 96–112)
CO2: 33 mEq/L — ABNORMAL HIGH (ref 19–32)
Calcium: 9 mg/dL (ref 8.4–10.5)
Creatinine, Ser: 0.68 mg/dL (ref 0.40–1.20)
GFR: 102.92 mL/min (ref >60.00)
GLUCOSE: 75 mg/dL (ref 70–99)
Potassium: 3.4 mEq/L — ABNORMAL LOW (ref 3.5–5.1)
Sodium: 137 mEq/L (ref 135–145)

## 2015-02-24 LAB — BRAIN NATRIURETIC PEPTIDE: Pro B Natriuretic peptide (BNP): 96 pg/mL (ref 0.0–100.0)

## 2015-02-24 NOTE — Telephone Encounter (Signed)
Patient called at 8:08 to reschedule 8:00 appointment for today because she is sick.

## 2015-02-24 NOTE — Telephone Encounter (Signed)
Thank you. Encounter closed. 

## 2015-02-26 ENCOUNTER — Ambulatory Visit (INDEPENDENT_AMBULATORY_CARE_PROVIDER_SITE_OTHER): Payer: BLUE CROSS/BLUE SHIELD | Admitting: *Deleted

## 2015-02-26 ENCOUNTER — Ambulatory Visit (INDEPENDENT_AMBULATORY_CARE_PROVIDER_SITE_OTHER): Payer: BLUE CROSS/BLUE SHIELD | Admitting: Internal Medicine

## 2015-02-26 ENCOUNTER — Encounter: Payer: Self-pay | Admitting: Internal Medicine

## 2015-02-26 VITALS — BP 120/82 | HR 78 | Ht 65.0 in | Wt 146.0 lb

## 2015-02-26 DIAGNOSIS — I359 Nonrheumatic aortic valve disorder, unspecified: Secondary | ICD-10-CM

## 2015-02-26 DIAGNOSIS — Z23 Encounter for immunization: Secondary | ICD-10-CM | POA: Diagnosis not present

## 2015-02-26 MED ORDER — SPIRONOLACTONE 50 MG PO TABS: 50.0000 mg | ORAL_TABLET | Freq: Every day | ORAL | Status: DC

## 2015-02-26 MED ORDER — FUROSEMIDE 40 MG PO TABS: 40.0000 mg | ORAL_TABLET | Freq: Every day | ORAL | Status: DC

## 2015-02-26 NOTE — Patient Instructions (Signed)
Medication Instructions: - no changes  Labwork: - none  Procedures/Testing: - none  Follow-Up: - To be determined  Any Additional Special Instructions Will Be Listed Below (If Applicable). - none

## 2015-02-26 NOTE — Progress Notes (Signed)
Cardiology Office Note   Date:  02/26/2015   ID:  Jessica [name], DOB 1976-07-08, MRN [id number]  PCP:  Garret Reddish, MD  Cardiologist:   Dorris Carnes, MD   No chief complaint on file.     History of Present Illness: Jessica [name] is a 38 y.o. female with a history of      Expand All Collapse All   of AV canal defect s/p repair in early life. In May 1991 she underwent modified Kon[place] operation with a 25 mm St Jude valve in the aortic position. In 1995 she had a 23 mm Carbomedics valve placed in the mitral position for a partially obstructed valve. 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 All surgeries were done at [facility]. In December 2012 she underwent MV replacement (3rd) with St Jude prosthesis. She also had TV reconstruction, MAZE procedure, isthmus ablation and reconstruction of her tricuspid valve.. Also had repair of ascending aortic tear. . She had an ablation of atrial flutter in March 2013 by Dr Jarrett Ables  Later in the spring she had problems with palpitations. Holter showed atrial ectopy, possible short bursts of atrial tach.She has been maintained on sotalol  I saw her in May 2015 IN the summer she went back to [facility] for ablation of AVNRT She was last seen by Jeanett Schlein in Jan          Since seen she has done well  Breathing is good  No Cp  No signif palpitations.  Active  Plans to go to [place] this month Abdomen is a little more bloated       Current Outpatient Prescriptions  Medication Sig Dispense Refill  . aspirin 81 MG tablet Take 81 mg by mouth daily.      . DULoxetine (CYMBALTA) 20 MG capsule Take 10 mg by mouth daily.    . furosemide (LASIX) 40 MG tablet TAKE 1 TABLET BY MOUTH EVERY DAY 30 tablet 0  . hydrochlorothiazide (HYDRODIURIL) 50 MG tablet TAKE 1 TABLET BY MOUTH DAILY 30 tablet 3  . NECON 1/35, 28, tablet TAKE 1 TABLET BY MOUTH EVERY DAY. SKIP PLACEBOS 4 Package 3  . potassium chloride (K-DUR) 10 MEQ tablet Take 2 tablets by mouth twice  daily. 120 tablet 1  . sertraline (ZOLOFT) 25 MG tablet Take 37.5 mg by mouth daily.    Marland Kitchen spironolactone (ALDACTONE) 50 MG tablet Take 1 tablet (50 mg total) by mouth daily. 90 tablet 1  . warfarin (COUMADIN) 4 MG tablet TAKE AS DIRECTED BY ANTICOAGULATION CLINIC 40 tablet 2  . zaleplon (SONATA) 10 MG capsule Take 1 capsule (10 mg total) by mouth at bedtime as needed for sleep. 30 capsule 0   No current facility-administered medications for this visit.    Allergies:   Betadine and Povidone-iodine   Past Medical History  Diagnosis Date  . 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 Migraine   . Depression   . Hepatitis C   . Atrioventricular canal (AVC), complete     Has had 7 sternotomies for repair of this  . Acute on chronic heart failure     periprosthetic mitral valve leak  . H/O mitral valve replacement     a. x 3 -  most recent 06/2011 - St. Jude MVR  . History of aortic valve replacement     a. 68mm SJM 1995  . H/O maze procedure     06/2011  .  History of tricuspid valve repair     06/2011  . Cancer 06/2011  . Dyspareunia   . HPV (human papilloma virus) infection 2006    follows GYN.   Marland Kitchen AVNRT (AV nodal re-entry tachycardia)   . B12 deficiency 03/09/2010    Associated paravalvular leak     Past Surgical History  Procedure Laterality Date  . Aorto ventricular canal defect repair      age 57 mths  . Mitral valve replacement  06/2011    Third mitral valve replacement  . Aortic valve replacement    . Sternotomy      She has had 7 sternotomy  . Cardioversion  08/17/2011    Procedure: CARDIOVERSION;  Surgeon: Carlena Bjornstad, MD;  Location: [place];  Service: Cardiovascular;  Laterality: N/A;  . Cardioversion  09/11/2011    Procedure: CARDIOVERSION;  Surgeon: Fay Records, MD;  Location: [place];  Service: Cardiovascular;  Laterality: N/A;  Carioversion  . Cardioversion  09/29/2011    Procedure: CARDIOVERSION;  Surgeon: Thayer Headings, MD;   Location: [place];  Service: Cardiovascular;  Laterality: N/A;  . Wisdom tooth extraction  16 years ago  . Cardiac electrophysiology study and ablation Right 3/15    persisitant A flutter  . Cardiac electrophysiology mapping and ablation  8/15  . Wrist surgery      bilaterally torn cartilage     Social History:  The patient  reports that she has never smoked. She has never used smokeless tobacco. She reports that she does not drink alcohol or use illicit drugs.   Family History:  The patient's family history includes Colon cancer in her paternal grandmother; Colon polyps in her father; Depression in her mother; Hyperlipidemia in her father and mother; Hypertension in her father; Thyroid disease in her mother.    ROS:  Please see the history of present illness. All other systems are reviewed and  Negative to the above problem except as noted.    PHYSICAL EXAM: VS:  BP 120/82 mmHg  Pulse 78  Ht 5\' 5"  (1.651 m)  Wt 146 lb (66.225 kg)  BMI 24.30 kg/m2  GEN: Well nourished, well developed, in no acute distress HEENT: normal Neck: no JVD, carotid bruits, or masses Cardiac: RRR; Crisp valve sounds , rubs, or gallops,no edema  Respiratory:  clear to auscultation bilaterally, normal work of breathing GI: soft, nontender, nondistended, + BS  No hepatomegaly  MS: no deformity Moving all extremities   Skin: warm and dry, no rash Neuro:  Strength and sensation are intact Psych: euthymic mood, full affect   EKG:  EKG is ordered today.  SR  78 bpm  RBBB  LAFB   Lipid Panel    Component Value Date/Time   CHOL 179 02/15/2015 0908   TRIG 152.0* 02/15/2015 0908   TRIG 99 06/28/2006 0931   HDL 38.50* 02/15/2015 0908   CHOLHDL 5 02/15/2015 0908   CHOLHDL 5.4 CALC 06/28/2006 0931   VLDL 30.4 02/15/2015 0908   LDLCALC 110* 02/15/2015 0908      Wt Readings from Last 3 Encounters:  02/26/15 146 lb (66.225 kg)  02/15/15 141 lb (63.957 kg)  10/15/14 140 lb (63.504 kg)      ASSESSMENT  AND PLAN: 1.  AV canal defect  Doing well Remains on coumadin with INR followed at [place]  2.  Rhythm  No signif arrhtyhmia  Holder done last month also shows no arrhythmia.  Would keep on current regimen  3.  Dyslipidemia  Lipids not bad  Continue to follow  Pt plans trip to [place]  I think it is reasonable to get Hep A vaccine.  F/U based on ruther appts with [place] today     Signed, Dorris Carnes, MD  02/26/2015 8:43 AM    [address], [place], [place]  [address] Phone: [phone]; Fax: [phone]

## 2015-03-01 ENCOUNTER — Ambulatory Visit: Payer: BC Managed Care – PPO | Admitting: Gynecology

## 2015-03-01 ENCOUNTER — Other Ambulatory Visit: Payer: Self-pay | Admitting: Internal Medicine

## 2015-03-02 ENCOUNTER — Encounter: Payer: Self-pay | Admitting: Obstetrics and Gynecology

## 2015-03-02 ENCOUNTER — Ambulatory Visit (INDEPENDENT_AMBULATORY_CARE_PROVIDER_SITE_OTHER): Payer: BLUE CROSS/BLUE SHIELD | Admitting: Obstetrics and Gynecology

## 2015-03-02 ENCOUNTER — Other Ambulatory Visit: Payer: Self-pay | Admitting: Family Medicine

## 2015-03-02 VITALS — BP 110/60 | HR 80 | Resp 14 | Ht 64.5 in | Wt 145.0 lb

## 2015-03-02 DIAGNOSIS — Z3009 Encounter for other general counseling and advice on contraception: Secondary | ICD-10-CM | POA: Diagnosis not present

## 2015-03-02 DIAGNOSIS — N941 Dyspareunia: Secondary | ICD-10-CM

## 2015-03-02 DIAGNOSIS — I509 Heart failure, unspecified: Secondary | ICD-10-CM | POA: Diagnosis not present

## 2015-03-02 DIAGNOSIS — Z23 Encounter for immunization: Secondary | ICD-10-CM | POA: Diagnosis not present

## 2015-03-02 DIAGNOSIS — G43109 Migraine with aura, not intractable, without status migrainosus: Secondary | ICD-10-CM | POA: Diagnosis not present

## 2015-03-02 DIAGNOSIS — Z01419 Encounter for gynecological examination (general) (routine) without abnormal findings: Secondary | ICD-10-CM | POA: Diagnosis not present

## 2015-03-02 DIAGNOSIS — IMO0002 Reserved for concepts with insufficient information to code with codable children: Secondary | ICD-10-CM

## 2015-03-02 MED ORDER — NORETHINDRONE-ETH ESTRADIOL 1-35 MG-MCG PO TABS: 1.0000 | ORAL_TABLET | Freq: Every day | ORAL | Status: DC

## 2015-03-02 NOTE — Progress Notes (Addendum)
Patient ID: Jessica [name], female   DOB: July 06, 1976, 39 y.o.   MRN: [id number] 39 y.o. [name] SingleCaucasianF here for annual exam.  The patient has been on OCP's since she was in her early 20's, her cardiologist is fine with her being on the pill. She went on OCP's for abnormal bleeding. She has been on coumadin since she was 6. She has 2 artificial heart valves. She has had migraines since she was a young child, only gets 3-4 a year, + aura. Dr Rushie Chestnut is her primary MD. Prior neurologist was okay with her being on OCP;s.  She needs contraception, same partner x 4+years. She has depression, feels, tapering off of Cymbalta, tapering on Zoloft. She is taking OCP's continuously, no menses.  She understands the risks and contraindications to OCP's, states she won't change. She feels the pills are a huge help for her depression, when she misses them she feels terrible.  She c/o deep dyspareunia every time she has had penetration for 3 years. Able to orgasm.  She is on multiple medications, for right heart failure since 2009.   No LMP recorded. Patient is not currently having periods (Reason: Oral contraceptives).          Sexually active: Yes.    The current method of family planning is OCP (estrogen/progesterone).    Exercising: Yes.    cardio, dance and weights  Smoker:  no  Health Maintenance: Pap:  06-29-14 WNL History of abnormal Pap:  Yes - repeated PAP- normal  MMG:  Never Colonoscopy:  N/A BMD:   N/A TDaP:  02-21-05 Screening Labs: PCP does labs    reports that she has never smoked. She has never used smokeless tobacco. She reports that she does not drink alcohol or use illicit drugs.  Past Medical History  Diagnosis Date  . 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 Migraine   . Depression   . Hepatitis C   . Atrioventricular canal (AVC), complete     Has had 7 sternotomies for repair of this  . Acute on chronic heart failure     periprosthetic  mitral valve leak  . H/O mitral valve replacement     a. x 3 -  most recent 06/2011 - St. Jude MVR  . History of aortic valve replacement     a. 36mm SJM 1995  . H/O maze procedure     06/2011  . History of tricuspid valve repair     06/2011  . Cancer 06/2011  . Dyspareunia   . HPV (human papilloma virus) infection 2006    follows GYN.   Marland Kitchen AVNRT (AV nodal re-entry tachycardia)   . B12 deficiency 03/09/2010    Associated paravalvular leak     Past Surgical History  Procedure Laterality Date  . Aorto ventricular canal defect repair      age 76 mths  . Mitral valve replacement  06/2011    Third mitral valve replacement  . Aortic valve replacement    . Sternotomy      She has had 7 sternotomy  . Cardioversion  08/17/2011    Procedure: CARDIOVERSION;  Surgeon: Carlena Bjornstad, MD;  Location: [place];  Service: Cardiovascular;  Laterality: N/A;  . Cardioversion  09/11/2011    Procedure: CARDIOVERSION;  Surgeon: Fay Records, MD;  Location: [place];  Service: Cardiovascular;  Laterality: N/A;  Carioversion  . Cardioversion  09/29/2011    Procedure: CARDIOVERSION;  Surgeon: Arnette Norris  Deboraha Sprang, MD;  Location: [place];  Service: Cardiovascular;  Laterality: N/A;  . Wisdom tooth extraction  16 years ago  . Cardiac electrophysiology study and ablation Right 3/15    persisitant A flutter  . Cardiac electrophysiology mapping and ablation  8/15  . Wrist surgery      bilaterally torn cartilage  . Colposcopy      Current Outpatient Prescriptions  Medication Sig Dispense Refill  . aspirin 81 MG tablet Take 81 mg by mouth daily.      . DULoxetine (CYMBALTA) 20 MG capsule Take 10 mg by mouth daily.    . furosemide (LASIX) 40 MG tablet Take 1 tablet (40 mg total) by mouth daily. 90 tablet 3  . hydrochlorothiazide (HYDRODIURIL) 50 MG tablet TAKE 1 TABLET BY MOUTH DAILY 30 tablet 5  . NECON 1/35, 28, tablet TAKE 1 TABLET BY MOUTH EVERY DAY. SKIP PLACEBOS 4 Package 3  . potassium chloride (K-DUR) 10 MEQ  tablet Take 2 tablets by mouth twice daily. 120 tablet 1  . sertraline (ZOLOFT) 25 MG tablet Take 37.5 mg by mouth daily.    Marland Kitchen spironolactone (ALDACTONE) 50 MG tablet Take 1 tablet (50 mg total) by mouth daily. 90 tablet 3  . warfarin (COUMADIN) 4 MG tablet TAKE AS DIRECTED BY ANTICOAGULATION CLINIC 40 tablet 2  . zaleplon (SONATA) 10 MG capsule Take 1 capsule (10 mg total) by mouth at bedtime as needed for sleep. 30 capsule 0   No current facility-administered medications for this visit.    Family History  Problem Relation Age of Onset  . Colon polyps Father   . Hyperlipidemia Mother   . Depression Mother   . Thyroid disease Mother   . Hyperlipidemia Father   . Hypertension Father   . Colon cancer Paternal Grandmother     ROS:  Pertinent items are noted in HPI.  Otherwise, a comprehensive ROS was negative.  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   Exam:   BP 110/60 mmHg  Pulse 80  Resp 14  Ht 5' 4.5" (1.638 m)  Wt 145 lb (65.772 kg)  BMI 24.51 kg/m2  Weight change: @WEIGHTCHANGE @ Height:   Height: 5' 4.5" (163.8 cm)  Ht Readings from Last 3 Encounters:  03/02/15 5' 4.5" (1.638 m)  02/26/15 5\' 5"  (1.651 m)  08/27/14 5' 4.5" (1.638 m)    General appearance: alert, cooperative and appears stated age Head: Normocephalic, without obvious abnormality, atraumatic Neck: no adenopathy, supple, symmetrical, trachea midline and thyroid normal to inspection and palpation Lungs: clear to auscultation bilaterally Breasts: normal appearance, no masses or tenderness Heart: regular rate and rhythm Abdomen: soft, non-tender; bowel sounds normal; no masses,  no organomegaly Extremities: extremities normal, atraumatic, no cyanosis or edema Skin: Skin color, texture, turgor normal. No rashes or lesions Lymph nodes: Cervical, supraclavicular, and axillary nodes normal. No abnormal inguinal nodes palpated Neurologic: Grossly normal   Pelvic: External genitalia:  no  lesions              Urethra:  normal appearing urethra with no masses, tenderness or lesions              Bartholins and Skenes: normal                 Vagina: normal appearing vagina with normal color and discharge, no lesions              Cervix: no lesions  Pap taken: No. Bimanual Exam:  Uterus:  normal size, contour, position, consistency, mobility, non-tender and mildly tender              Adnexa: Slightly tender with some fullness in the left adnexa, normal right adnexa. No pelvic floor tenderness.               Rectovaginal: Confirms               Anus:  normal sphincter tone, no lesions  Chaperone was present for exam.  A:  Well Woman with normal exam  Contraception management  Deep dyspareunia, uterine and left adnexal tenderness and fullness (no CMT)  CHF  Heart valve replacements  On coumadin  Migraines with aura  Hepatitis C, recent normal LFT's  Hyperlipidemia, followed by primary  P:   No pap this year  Return for a gyn ultrasound  We discussed the possibility of treating for possible endometritis with doxycycline, will further discuss after her ultrasound  Declined STD testing  Patient given the CDC information (table) on medical issues and contraception use, I'm particularly concerned with her heart failure, arrhythmia, and her migraines with aura. She is on coumadin which decreases her risk of clotting, but  she is still at risk of a stroke from vasospasm  Recommend she be on a progesterone only form of contraception, she is resistant  Will discuss the patient with my partners (she is aware) and get their opinions as well. She states her cardiologist and prior neurologist were okay with her being on combination OCP's.  1 month of pills to get her through to her f/u appointment  TDAP today   Addendum: please see below correspondence with the patient  Hi Estill Bamberg, I agree with you that you shouldn't get pregnant. I believe the safest method of  contraception for you is a progesterone only method. A mirena IUD would give you a 50% chance of not having any cycles, if you did cycle it should be light. I use the mirena as frequently to control abnormal bleeding as I do to prevent pregnancy. Combination oral contraception can increase your risk of a MI, stroke and blood clots. I agree that the coumadin lessens the risk, but doesn't eliminate it.  I understand your concerns about depression. Given your medical issues, I think you would be better served by adjusting your Zoloft (you are on a very low dose) than continuing on estrogen. The World Health Organization and the [employer] of Obstetrics and Gynecology both agree that the risk of combination oral contraception in women with migraines and aura's is unacepptable.  I have spoken with Dr Sabra Heck and Dr Quincy Simmonds and they both agree that you shouldn't be on combination oral contraception. Dr Quincy Simmonds was not aware of your migraines with aura. I want to support you and help you, but I can't go the standards of care. I hope you can understand this. I truly want the best for you! If you would like to seek care else where, I totally understand. If you want to try a progesterone only method, I would love to help you. Please let me know. Sumner Boast, MD   ===View-only below this line===   ----- Message -----    From: Desmond Dike    Sent: 03/02/2015 10:22 PM EDT      To: Salvadore Dom, MD Subject: Non-Urgent Medical Question  I've been reading and thinking. The research is not clear. Neurologists have taken an individual approach: http://migraineagain.com/why-oral-contraceptives-can-be-risky-for-some-migraine-sufferers/ I  find two types of stroke: ischemic & hemorrhagic, for which I'm at lower than normal risks due to coumadin which is carefully monitored.  I am willing to try a lower progestin pill. I can see a neurologist. IUDs are contraindicated for women with abnormal paps, no? My  risk for stroke, absent the coumadin factor, would be 0.04068% (using 3.6 times 11.3 events (highest rates of stroke of women on COC with migraines with auras). I have 3 large concerns: 1) depression impacted by hormones (real in my case), 2) substantial bleeding which other doctors (cardiologists, neurologists, and gynecologists) have all deemed too risky to change plans, and 3) not becoming pregnant (would have abortion). Life is about calculated risks, this is one, absent clearer evidence, I'm willing to take.   Cc:  Dr Dorris Carnes   Dr Cecille Aver (Cardiology, [facility])

## 2015-03-04 ENCOUNTER — Telehealth: Payer: Self-pay | Admitting: Emergency Medicine

## 2015-03-04 ENCOUNTER — Ambulatory Visit (INDEPENDENT_AMBULATORY_CARE_PROVIDER_SITE_OTHER): Payer: BLUE CROSS/BLUE SHIELD | Admitting: Family Medicine

## 2015-03-04 ENCOUNTER — Encounter: Payer: Self-pay | Admitting: Family Medicine

## 2015-03-04 VITALS — BP 100/80 | HR 81 | Temp 99.0°F | Wt 146.0 lb

## 2015-03-04 DIAGNOSIS — J029 Acute pharyngitis, unspecified: Secondary | ICD-10-CM

## 2015-03-04 LAB — POCT RAPID STREP A (OFFICE): RAPID STREP A SCREEN: NEGATIVE

## 2015-03-04 NOTE — Progress Notes (Signed)
Garret Reddish, MD  Subjective:  Jessica [name] is a 39 y.o. year old very pleasant female patient who presents with:  sore throat since Monday, on plane on Sunday. Took benadryl for drainage. Mild sinus drainage down back of throat. No known sick contacts. No kid contacts. Stable in course.   ROS- no fever/chills/nausea/vomiting. No cough.   Past Medical History- high risk patient with history of congenital heart disease and MVR and AVR, history of arrhythmia as well. Also on coumadin  Medications- reviewed and updated Current Outpatient Prescriptions  Medication Sig Dispense Refill  . aspirin 81 MG tablet Take 81 mg by mouth daily.      . DULoxetine (CYMBALTA) 20 MG capsule Take 10 mg by mouth daily.    . furosemide (LASIX) 40 MG tablet Take 1 tablet (40 mg total) by mouth daily. 90 tablet 3  . hydrochlorothiazide (HYDRODIURIL) 50 MG tablet TAKE 1 TABLET BY MOUTH DAILY 30 tablet 5  . norethindrone-ethinyl estradiol 1/35 (NECON 1/35, 28,) tablet Take 1 tablet by mouth daily. 1 Package 0  . potassium chloride (K-DUR) 10 MEQ tablet TAKE 2 TABLETS BY MOUTH TWICE DAILY 120 tablet 5  . sertraline (ZOLOFT) 25 MG tablet Take 37.5 mg by mouth daily.    Marland Kitchen spironolactone (ALDACTONE) 50 MG tablet Take 1 tablet (50 mg total) by mouth daily. 90 tablet 3  . warfarin (COUMADIN) 4 MG tablet TAKE AS DIRECTED BY ANTICOAGULATION CLINIC 40 tablet 2  . zaleplon (SONATA) 10 MG capsule Take 1 capsule (10 mg total) by mouth at bedtime as needed for sleep. (Patient not taking: Reported on 03/04/2015) 30 capsule 0   Objective: BP 100/80 mmHg  Pulse 81  Temp(Src) 99 F (37.2 C)  Wt 146 lb (66.225 kg) Gen: NAD, resting comfortably No lymphadenopathy TM normal, pharynx with mild erythema, no tonsilar enlargement or excudate, nares mildly erythematous CV: audible mechanical click even without stethoscope Lungs: CTAB no crackles, wheeze, rhonchi Abdomen: soft/nontender/nondistended/normal bowel sounds.   Ext:  no edema Skin: warm, dry, no rash Neuro: grossly normal, moves all extremities  Results for orders placed or performed in visit on 03/04/15 (from the past 24 hour(s))  POC Rapid Strep A     Status: None   Collection Time: 03/04/15 11:41 AM  Result Value Ref Range   Rapid Strep A Screen Negative Negative   Assessment/Plan:  Viral pharyngitis Rapid strep negative. Low centor at 1. Did not appear to be strep on exam. Did not send for culture. Discussed conservative measures including 2g tylenol total daily and salt water gargles, sore throat lozenges.   Return precautions advised.

## 2015-03-04 NOTE — Patient Instructions (Addendum)
Medication Instructions:  No changes  Other Instructions:  If symptoms last more than 7-10 days or worsen after day 5 or so, return to care also if you had a fever please let us know  Labwork: Rapid strep negative Did not think strep likely so we did not culture you  Follow-Up: Previously scheduled or as needed

## 2015-03-04 NOTE — Telephone Encounter (Signed)
Chief Complaint  Patient presents with  . Advice Only    Patient sent mychart message    Non-Urgent Medical Question  Message [phone]   From  KELLENE MCCLEARY   To  Salvadore Dom, MD   Sent  03/02/2015 10:22 PM     I've been reading and thinking. The research is not clear. Neurologists have taken an individual approach: http://migraineagain.com/why-oral-contraceptives-can-be-risky-for-some-migraine-sufferers/ I find two types of stroke: ischemic & hemorrhagic, for which I'm at lower than normal risks due to coumadin which is carefully monitored.  I am willing to try a lower progestin pill. I can see a neurologist.  IUDs are contraindicated for women with abnormal paps, no?  My risk for stroke, absent the coumadin factor, would be 0.04068% (using 3.6 times 11.3 events (highest rates of stroke of women on COC with migraines with auras).  I have 3 large concerns: 1) depression impacted by hormones (real in my case), 2) substantial bleeding which other doctors (cardiologists, neurologists, and gynecologists) have all deemed too risky to change plans, and 3) not becoming pregnant (would have abortion). Life is about calculated risks, this is one, absent clearer evidence, I'm willing to take.

## 2015-03-05 ENCOUNTER — Ambulatory Visit (INDEPENDENT_AMBULATORY_CARE_PROVIDER_SITE_OTHER): Payer: BLUE CROSS/BLUE SHIELD | Admitting: *Deleted

## 2015-03-05 DIAGNOSIS — Z23 Encounter for immunization: Secondary | ICD-10-CM

## 2015-03-08 NOTE — Telephone Encounter (Signed)
Dr. Talbert Nan sent mychart response to patient 03/04/15.

## 2015-03-11 ENCOUNTER — Ambulatory Visit (INDEPENDENT_AMBULATORY_CARE_PROVIDER_SITE_OTHER): Payer: BLUE CROSS/BLUE SHIELD | Admitting: General Practice

## 2015-03-11 DIAGNOSIS — I059 Rheumatic mitral valve disease, unspecified: Secondary | ICD-10-CM

## 2015-03-11 DIAGNOSIS — Z954 Presence of other heart-valve replacement: Secondary | ICD-10-CM

## 2015-03-11 DIAGNOSIS — D6852 Prothrombin gene mutation: Secondary | ICD-10-CM | POA: Diagnosis not present

## 2015-03-11 DIAGNOSIS — I4891 Unspecified atrial fibrillation: Secondary | ICD-10-CM

## 2015-03-11 DIAGNOSIS — Z5181 Encounter for therapeutic drug level monitoring: Secondary | ICD-10-CM

## 2015-03-11 DIAGNOSIS — Z952 Presence of prosthetic heart valve: Secondary | ICD-10-CM

## 2015-03-11 DIAGNOSIS — I359 Nonrheumatic aortic valve disorder, unspecified: Secondary | ICD-10-CM

## 2015-03-11 DIAGNOSIS — D6859 Other primary thrombophilia: Secondary | ICD-10-CM

## 2015-03-11 LAB — POCT INR: INR: 2.6

## 2015-03-11 NOTE — Progress Notes (Signed)
Pre visit review using our clinic review tool, if applicable. No additional management support is needed unless otherwise documented below in the visit note. 

## 2015-03-19 ENCOUNTER — Telehealth: Payer: Self-pay | Admitting: Obstetrics and Gynecology

## 2015-03-19 NOTE — Telephone Encounter (Signed)
Called patient to verify benefits for PUS and schedule. Patients voicemail full. Unable to leave message.

## 2015-03-25 NOTE — Telephone Encounter (Signed)
Called to verify benefits for PUS and schedule. Patients voicemail full. Unable to leave message. 2nd attempt

## 2015-04-01 NOTE — Telephone Encounter (Signed)
[name],  I spoke with and scheduled Jessica [name] for this Tuesday with Dr Talbert Nan. Her work schedule made it necessary to have the 4pm ultrasound appointment. There is no corresponding 430 consult so i scheduled her for 415 - that was Dr Albertina Senegal end of day. I hope this is ok. Also, can you enter the order for her ultrasound?  I'll keep working this morning to fill up Dr Gentry Fitz Tuesday.   Thanks,  [facility]

## 2015-04-01 NOTE — Telephone Encounter (Signed)
Spoke with patient. Reviewed benefits. Patient understands and is agreeable. Patient scheduled for Tuesday 04/06/15 @4pm  with consult directly following based on patients work schedule. Patient is aware of 72 hour cancellation policy and $768 fee. Ok to close.

## 2015-04-01 NOTE — Telephone Encounter (Signed)
Order extended and linked to appointment.

## 2015-04-01 NOTE — Telephone Encounter (Signed)
Jessica [name], can you please try patient again for Pelvic ultrasound with Dr. Talbert Nan? If no response after third attempt please route to Dr. Talbert Nan.

## 2015-04-06 ENCOUNTER — Encounter: Payer: Self-pay | Admitting: Obstetrics and Gynecology

## 2015-04-06 ENCOUNTER — Ambulatory Visit (INDEPENDENT_AMBULATORY_CARE_PROVIDER_SITE_OTHER): Payer: BLUE CROSS/BLUE SHIELD | Admitting: Obstetrics and Gynecology

## 2015-04-06 ENCOUNTER — Ambulatory Visit (INDEPENDENT_AMBULATORY_CARE_PROVIDER_SITE_OTHER): Payer: BLUE CROSS/BLUE SHIELD

## 2015-04-06 VITALS — BP 122/78 | HR 76 | Resp 14 | Wt 144.0 lb

## 2015-04-06 DIAGNOSIS — IMO0002 Reserved for concepts with insufficient information to code with codable children: Secondary | ICD-10-CM

## 2015-04-06 DIAGNOSIS — N941 Dyspareunia: Secondary | ICD-10-CM

## 2015-04-06 MED ORDER — DOXYCYCLINE HYCLATE 100 MG PO CAPS: ORAL_CAPSULE | ORAL | Status: DC

## 2015-04-06 NOTE — Progress Notes (Signed)
      S: the patient presents for further evaluation of deep dyspareunia. She continues to have pain with penetration since her last visit.    Past Medical History  Diagnosis Date  . 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 Migraine   . Depression   . Hepatitis C   . Atrioventricular canal (AVC), complete     Has had 7 sternotomies for repair of this  . Acute on chronic heart failure     periprosthetic mitral valve leak  . H/O mitral valve replacement     a. x 3 -  most recent 06/2011 - St. Jude MVR  . History of aortic valve replacement     a. 26mm SJM 1995  . H/O maze procedure     06/2011  . History of tricuspid valve repair     06/2011  . Cancer 06/2011  . Dyspareunia   . HPV (human papilloma virus) infection 2006    follows GYN.   Marland Kitchen AVNRT (AV nodal re-entry tachycardia)   . B12 deficiency 03/09/2010    Associated paravalvular leak    Past Surgical History  Procedure Laterality Date  . Aorto ventricular canal defect repair      age 84 mths  . Mitral valve replacement  06/2011    Third mitral valve replacement  . Aortic valve replacement    . Sternotomy      She has had 7 sternotomy  . Cardioversion  08/17/2011    Procedure: CARDIOVERSION;  Surgeon: Carlena Bjornstad, MD;  Location: [place];  Service: Cardiovascular;  Laterality: N/A;  . Cardioversion  09/11/2011    Procedure: CARDIOVERSION;  Surgeon: Fay Records, MD;  Location: [place];  Service: Cardiovascular;  Laterality: N/A;  Carioversion  . Cardioversion  09/29/2011    Procedure: CARDIOVERSION;  Surgeon: Thayer Headings, MD;  Location: [place] [place];  Service: Cardiovascular;  Laterality: N/A;  . Wisdom tooth extraction  16 years ago  . Cardiac electrophysiology study and ablation Right 3/15    persisitant A flutter  . Cardiac electrophysiology mapping and ablation  8/15  . Wrist surgery      bilaterally torn cartilage  . Colposcopy      Review of Systems  Gastrointestinal: Positive for  diarrhea.  Genitourinary:       Painful intercourse  Psychiatric/Behavioral: Positive for depression.  All other systems reviewed and are negative.   O: General: alert white female in NAD Blood pressure 122/78, pulse 76, resp. rate 14, weight 144 lb (65.318 kg). Pelvic: Normal external genitalia. BM exam: no CMT, uterus is anteverted, mobile, tender to palpation. Mild right adnexal tenderness, no masses. No bladder tenderness, no pelvic floor tenderness.  Ultrasound of the pelvis was normal  Assessment:  Deep dyspareunia, uterus is tender to palpation. Normal pelvic ultrasound. No pelvic floor or bladder tenderness. Concerning for possible endometritis  Contraception: she wants to continue on OCP's, understands I'm not comfortable prescribing them for her, she will seek care elsewhere  Plan  Trial of doxycycline for possible endometritis, she should be seen if her symptoms don't improve after treatment  She will f/u elsewhere for further care/contraception. She understands that I respect her decision

## 2015-04-07 IMAGING — CR DG CHEST 2V
2 series · 2 of 2 positions shown · non-contrast
Comparison: 05/10/2011

CLINICAL DATA: Persistent cough, congestion for 2 weeks. History of
artificial heart valve.

EXAM:
CHEST  2 VIEW

[view not recorded (1 of 2)]
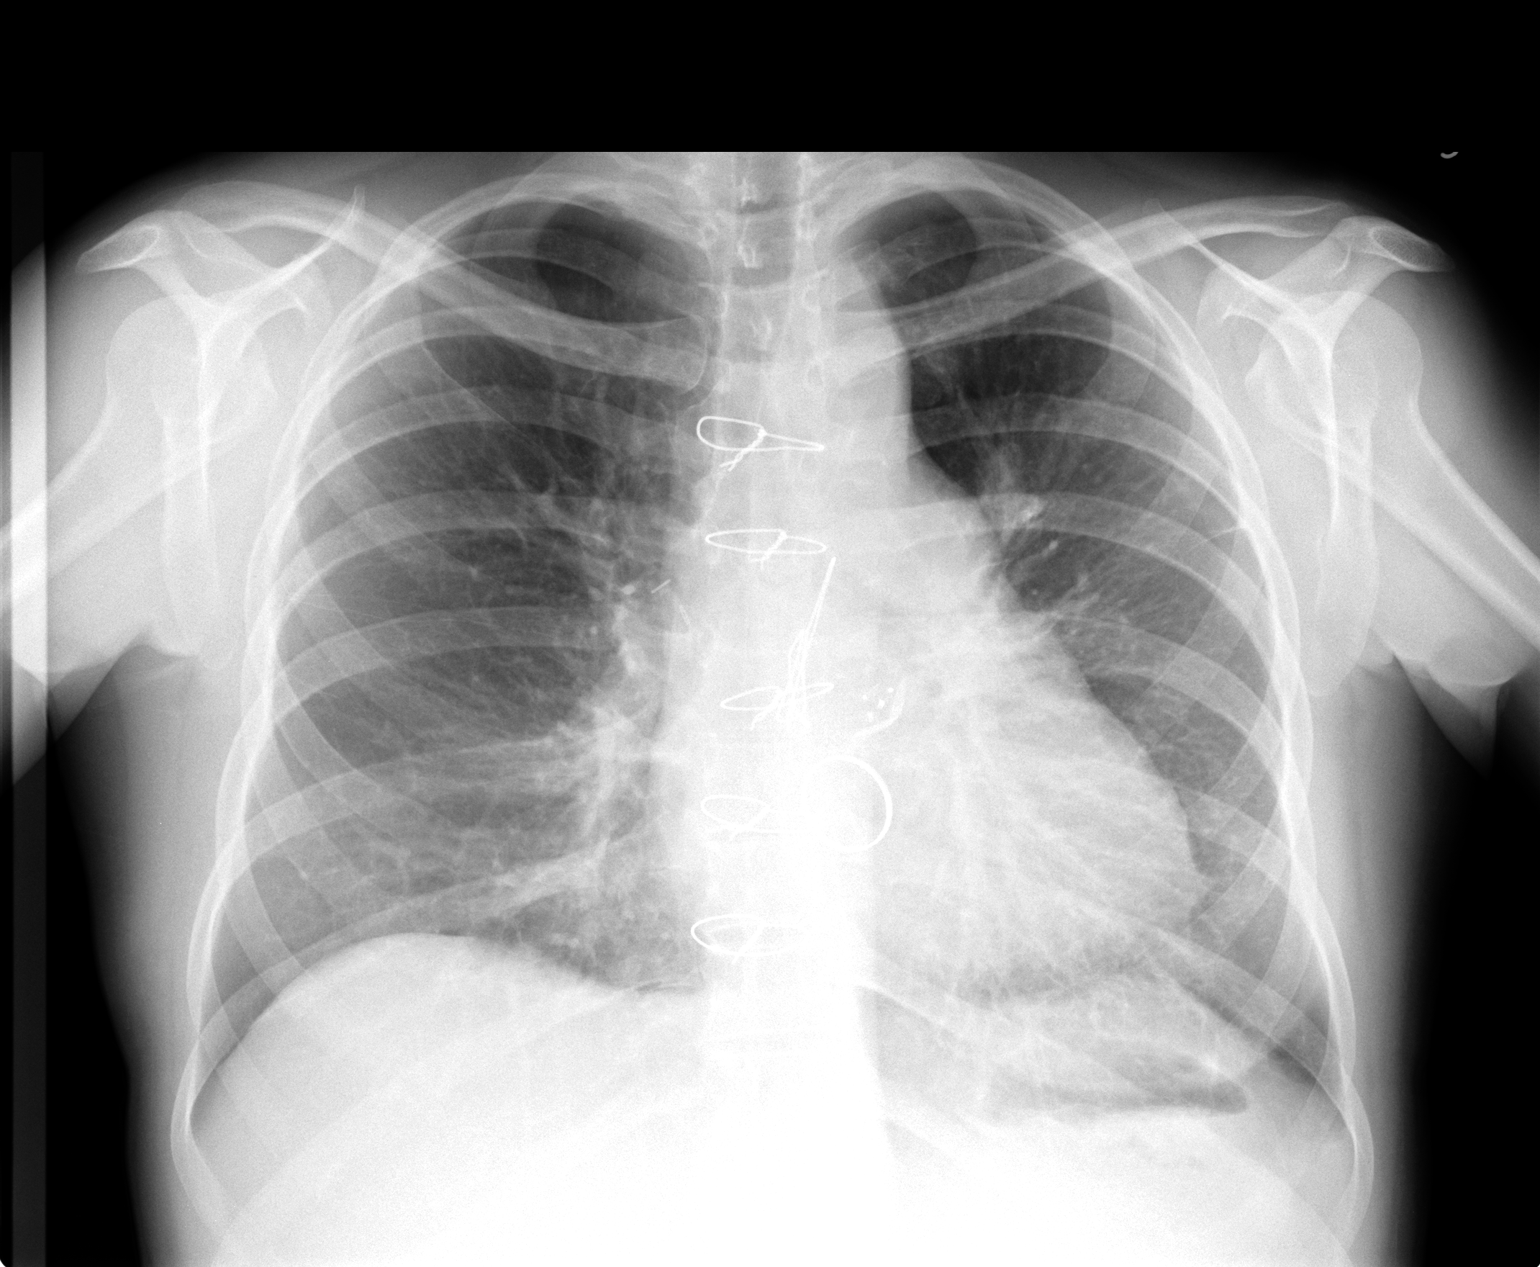

[view not recorded (2 of 2)]
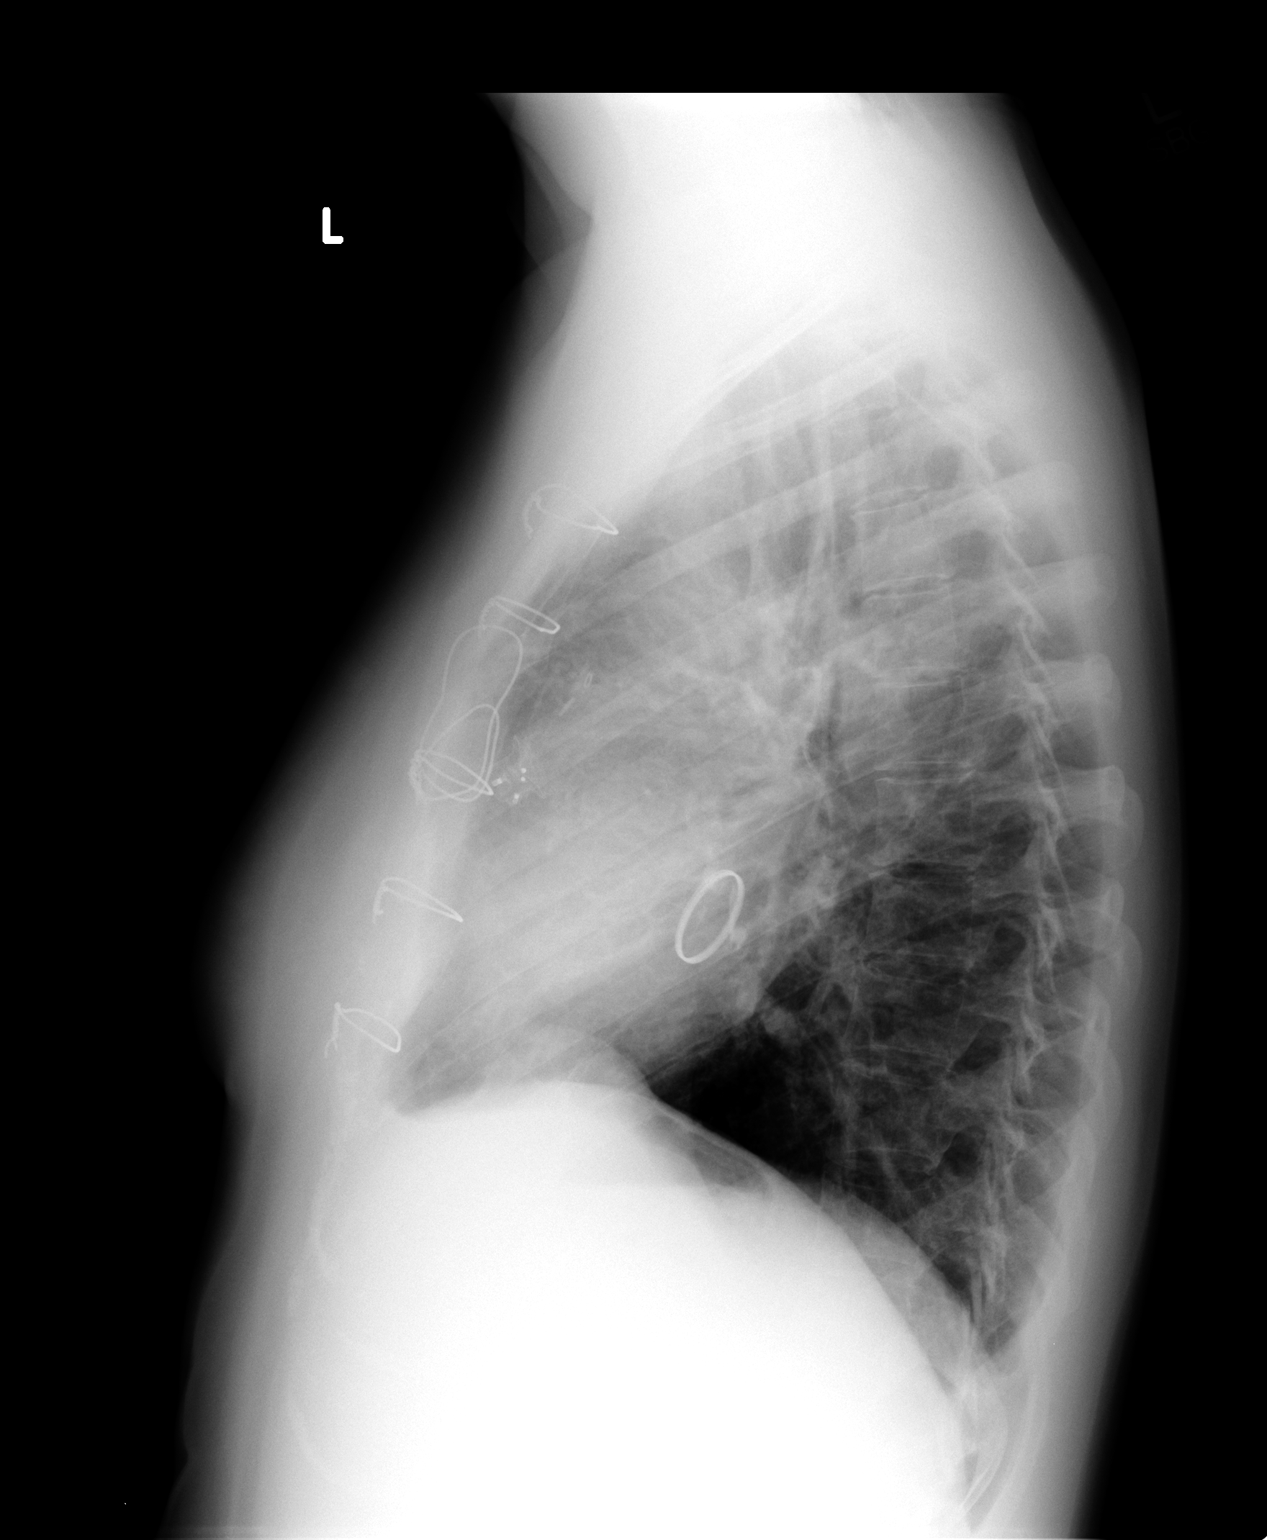

[2 of 2 positions shown; findings below may reference images not displayed]

FINDINGS: There is no focal parenchymal opacity, pleural effusion, or
pneumothorax. The heart and mediastinal contours are unremarkable.
Prior aortic valve replacement. Prior mitral valve replacement.
There is evidence of prior median sternotomy.

The osseous structures are unremarkable.
IMPRESSION: No active cardiopulmonary disease.

## 2015-04-13 ENCOUNTER — Ambulatory Visit (INDEPENDENT_AMBULATORY_CARE_PROVIDER_SITE_OTHER): Payer: BLUE CROSS/BLUE SHIELD | Admitting: Family Medicine

## 2015-04-13 ENCOUNTER — Ambulatory Visit (INDEPENDENT_AMBULATORY_CARE_PROVIDER_SITE_OTHER): Payer: BLUE CROSS/BLUE SHIELD

## 2015-04-13 VITALS — BP 108/80 | HR 77 | Temp 99.4°F | Resp 16 | Ht 65.0 in | Wt 140.0 lb

## 2015-04-13 DIAGNOSIS — M79671 Pain in right foot: Secondary | ICD-10-CM | POA: Diagnosis not present

## 2015-04-13 NOTE — Patient Instructions (Signed)
RICE: Routine Care for Injuries The routine care of many injuries includes Rest, Ice, Compression, and Elevation (RICE). HOME CARE INSTRUCTIONS  Rest is needed to allow your body to heal. Routine activities can usually be resumed when comfortable. Injured tendons and bones can take up to 6 weeks to heal. Tendons are the cord-like structures that attach muscle to bone.  Ice following an injury helps keep the swelling down and reduces pain.  Put ice in a plastic bag.  Place a towel between your skin and the bag.  Leave the ice on for 15-20 minutes, 3-4 times a day, or as directed by your health care provider. Do this while awake, for the first 24 to 48 hours. After that, continue as directed by your caregiver.  Compression helps keep swelling down. It also gives support and helps with discomfort. If an elastic bandage has been applied, it should be removed and reapplied every 3 to 4 hours. It should not be applied tightly, but firmly enough to keep swelling down. Watch fingers or toes for swelling, bluish discoloration, coldness, numbness, or excessive pain. If any of these problems occur, remove the bandage and reapply loosely. Contact your caregiver if these problems continue.  Elevation helps reduce swelling and decreases pain. With extremities, such as the arms, hands, legs, and feet, the injured area should be placed near or above the level of the heart, if possible. SEEK IMMEDIATE MEDICAL CARE IF:  You have persistent pain and swelling.  You develop redness, numbness, or unexpected weakness.  Your symptoms are getting worse rather than improving after several days. These symptoms may indicate that further evaluation or further X-rays are needed. Sometimes, X-rays may not show a small broken bone (fracture) until 1 week or 10 days later. Make a follow-up appointment with your caregiver. Ask when your X-ray results will be ready. Make sure you get your X-ray results. Document Released:  10/22/2000 Document Revised: 07/15/2013 Document Reviewed: 12/09/2010 Exit[employer] Patient Information 2015 [employer]. This information is not intended to replace advice given to you by your health care provider. Make sure you discuss any questions you have with your health care provider.  

## 2015-04-13 NOTE — Progress Notes (Signed)
Patient ID: Jessica [name], female   DOB: [birth year], 39 y.o.   MRN: [id number] [facility] reading (PRIMARY) by  Dr. Brigitte Pulse. Rt foot xray:  Poss disruption of the cortex of the medial proximal 4th metatarsal. Some volar displacement of the distal 5th metatarsal contributing to her metatarsalgia.  Pt placed in post-op shoe x 1 mo. Refer to sports med for further eval, may benefit from custom orthotics.  Pt independently seen and examined by myself.  Reviewed documentation and xray and agree w/ assessment and plan. Delman Cheadle, MD MPH

## 2015-04-13 NOTE — Progress Notes (Signed)
  Subjective:    Jessica [name] is a 39 y.o. female who presents with right foot pain. Onset of the symptoms was three days ago. Precipitating event: none known. Current symptoms include: pain on the lateral aspect of the right forefoot. Pain worse with plantarflexion. . Aggravating factors: any weight bearing. Symptoms have gradually worsened. Patient has had no prior foot problems. Evaluation to date: none. Treatment to date: none.  She has a hx of ballet, tap and jazz dancer for 13 years but denies any prior foot injuries or problems.  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   Review of Systems Pertinent items are noted in HPI.    Objective:    BP 108/80 mmHg  Pulse 77  Temp(Src) 99.4 F (37.4 C) (Oral)  Resp 16  Ht 5\' 5"  (1.651 m)  Wt 140 lb (63.504 kg)  BMI 23.30 kg/m2  SpO2 99% Right foot:  No erythema or ecchymosis  Callus formation on the plantar aspect under 3rd metatarsal on b/l feet Bunionette observed on lateral aspect of right foot   TTP on the lateral aspect of the right forefoot  Normal range of motion and strength  Pes cavus b/l  Some splaying of the 2nd and 3rd toe with loss of the transverse arch  Neg talar tilt  Neg anterior drawer   Left foot:  normal exam, no swelling, tenderness, instability; ligaments intact, full range of motion of all ankle/foot joints   Imaging: X-ray of the right foot:see above Assessment:   Right foot pain    Plan:   Pain most likely either bunionette vs metatarsalgia vs morton's neuroma  Imaging revealing for  - patient provided post op shoe  - referral to sports medicine for gait analysis and consideration of orthotics.  - take tylenol for pain  - can use Aspercreme  - avoiding NSAIDS in setting of chronic coumadin use

## 2015-04-16 ENCOUNTER — Encounter: Payer: Self-pay | Admitting: Family Medicine

## 2015-04-16 ENCOUNTER — Ambulatory Visit (INDEPENDENT_AMBULATORY_CARE_PROVIDER_SITE_OTHER): Payer: BLUE CROSS/BLUE SHIELD | Admitting: Family Medicine

## 2015-04-16 VITALS — BP 124/78 | HR 75 | Temp 99.0°F | Wt 142.0 lb

## 2015-04-16 DIAGNOSIS — Z23 Encounter for immunization: Secondary | ICD-10-CM

## 2015-04-16 DIAGNOSIS — D6852 Prothrombin gene mutation: Secondary | ICD-10-CM

## 2015-04-16 DIAGNOSIS — B1920 Unspecified viral hepatitis C without hepatic coma: Secondary | ICD-10-CM | POA: Diagnosis not present

## 2015-04-16 DIAGNOSIS — D6859 Other primary thrombophilia: Secondary | ICD-10-CM

## 2015-04-16 MED ORDER — NORETHINDRONE-ETH ESTRADIOL 1-35 MG-MCG PO TABS: 1.0000 | ORAL_TABLET | Freq: Every day | ORAL | Status: AC

## 2015-04-16 NOTE — Assessment & Plan Note (Signed)
S: Hep C viral load has been undetectable. Patient started Hep A series for travel -on accelerated plan with twinrix to immunize against A and B started by Dr. Shawna Orleans.  A/P: doing well. She will have her 3rd series today of twinrix with final repeat in August or September of 2017.

## 2015-04-16 NOTE — Progress Notes (Signed)
Garret Reddish, MD  Subjective:  Jessica [name] is a 39 y.o. year old very pleasant female patient who presents for/with See problem oriented charting ROS- no chest pain or shortness of breath- continues to dance, no abnormal bruising or bleeding, no ruq pain  Past Medical History-  Patient Active Problem List   Diagnosis Date Noted  . Chronic systolic congestive heart failure 08/06/2014    Priority: High  . Arrhythmia 12/08/2010    Priority: High  . Congenital heart disease 06/26/2008    Priority: High  . MITRAL VALVE REPLACEMENT, HX OF 02/11/2007    Priority: High  . History of aortic valve replacement 02/11/2007    Priority: High  . Depression 08/06/2014    Priority: Medium  . Insomnia 08/06/2014    Priority: Medium  . ABNORMAL TRANSAMINASE-LFT'S 06/26/2008    Priority: Medium  . Hyperlipidemia 06/08/2008    Priority: Medium  . Migraine 02/11/2007    Priority: Medium  . Encounter for therapeutic drug monitoring 09/01/2013    Priority: Low  . Primary hypercoagulable state 07/27/2011    Priority: Low  . Acquired hemolytic anemia 11/29/2009    Priority: Low  . Hepatitis C     Medications- reviewed and updated Current Outpatient Prescriptions  Medication Sig Dispense Refill  . aspirin 81 MG tablet Take 81 mg by mouth daily.      Marland Kitchen doxycycline (VIBRAMYCIN) 100 MG capsule Take BID for 10 days.  Take with food as can cause GI distress. 20 capsule 0  . DULoxetine (CYMBALTA) 20 MG capsule Take 10 mg by mouth daily.    . furosemide (LASIX) 40 MG tablet Take 1 tablet (40 mg total) by mouth daily. 90 tablet 3  . hydrochlorothiazide (HYDRODIURIL) 50 MG tablet TAKE 1 TABLET BY MOUTH DAILY 30 tablet 5  . norethindrone-ethinyl estradiol 1/35 (NECON 1/35, 28,) tablet Take 1 tablet by mouth daily. Continuous use. 1 Package 3  . potassium chloride (K-DUR) 10 MEQ tablet TAKE 2 TABLETS BY MOUTH TWICE DAILY 120 tablet 5  . sertraline (ZOLOFT) 25 MG tablet Take 37.5 mg by mouth daily.     Marland Kitchen spironolactone (ALDACTONE) 50 MG tablet Take 1 tablet (50 mg total) by mouth daily. 90 tablet 3  . warfarin (COUMADIN) 4 MG tablet TAKE AS DIRECTED BY ANTICOAGULATION CLINIC 40 tablet 2  . zaleplon (SONATA) 10 MG capsule Take 1 capsule (10 mg total) by mouth at bedtime as needed for sleep. 30 capsule 0   No current facility-administered medications for this visit.    Objective: BP 124/78 mmHg  Pulse 75  Temp(Src) 99 F (37.2 C)  Wt 142 lb (64.411 kg) Exam deferred  Assessment/Plan:  Hepatitis C S: Hep C viral load has been undetectable. Patient started Hep A series for travel -on accelerated plan with twinrix to immunize against A and B started by Dr. Shawna Orleans.  A/P: doing well. She will have her 3rd series today of twinrix with final repeat in August or September of 2017.    Primary hypercoagulable state S: Patient is no longer following with her gyn. Previously she had been on combined oral contraceptives for 17 years with no difficulties. Providers concern are vasospasm/stroke on estrogen given migraines with aura. Also concern clotting, heart attack- with some lowered risks from coumadin. Patient has done extensive research and is aware of her risks. On the other hand, with her heart condition- full term pregnancy may lead to her death, she would have to have an abortion liekly if she became  pregnant. In addition, history of heavy prolonged bleed 17 years ago vaginally with no clear source on coumadin- the only thing that stopped that was combined oral contraceptives. We would place her at risk being off medicine. A prolonged bleed on coumadin would also be a huge risk. IUD as recommended by gyn would risk perforation and while on coumadin could be life threatening as well A/P: I discussed benefits/risks of both sides. For her quality of life despite the risks, she wants to remain on current medication. I am willing to prescribe short term as she looks for a new GYN. 3 months given today.     Return precautions advised. Also see AVS for counseling. Patient is fully aware this decision puts her life at risk- difficult balance as also potentially improves her health as well. This visit required complex decision making and review of prior records, GYN thoughts.   Orders Placed This Encounter  Procedures  . Hepatitis A hepatitis B combined vaccine IM   Meds ordered this encounter  Medications  . norethindrone-ethinyl estradiol 1/35 (NECON 1/35, 28,) tablet    Sig: Take 1 tablet by mouth daily. Continuous use.    Dispense:  1 Package    Refill:  3   >50% of 25 minute office visit was spent on counseling and coordination of care

## 2015-04-16 NOTE — Assessment & Plan Note (Signed)
S: Patient is no longer following with her gyn. Previously she had been on combined oral contraceptives for 17 years with no difficulties. Providers concern are vasospasm/stroke on estrogen given migraines with aura. Also concern clotting, heart attack- with some lowered risks from coumadin. Patient has done extensive research and is aware of her risks. On the other hand, with her heart condition- full term pregnancy may lead to her death, she would have to have an abortion liekly if she became pregnant. In addition, history of heavy prolonged bleed 17 years ago vaginally with no clear source on coumadin- the only thing that stopped that was combined oral contraceptives. We would place her at risk being off medicine. A prolonged bleed on coumadin would also be a huge risk. IUD as recommended by gyn would risk perforation and while on coumadin could be life threatening as well A/P: I discussed benefits/risks of both sides. For her quality of life despite the risks, she wants to remain on current medication. I am willing to prescribe short term as she looks for a new GYN. 3 months given today.

## 2015-04-16 NOTE — Patient Instructions (Addendum)
You will need your final Twinrix in 11-12 months from now  I refilled your combined oral contraceptive pill. You are already well aware of the risks including vasospasm/stroke considering your migraine with aura, heart attack, blood clot risk. Coumadin should lower but not eliminate this risk. This is balanced by risk for pregnancy which would be life threatening as well as prolonged bleeding like you had 17 years ago which could also be life threatening. Progesterone only such as an IUD would be a reasonable option (but perforation of uterus would place you at high bleeding risk as well) so you have opted out of this option. There are other options for progesterone only if you choose to use. For now, i have given 3 months to allow you to transition gynecologists

## 2015-04-22 ENCOUNTER — Ambulatory Visit: Payer: BLUE CROSS/BLUE SHIELD

## 2015-04-23 ENCOUNTER — Inpatient Hospital Stay (HOSPITAL_COMMUNITY)
Admission: AD | Admit: 2015-04-23 | Discharge: 2015-04-26 | DRG: 314 | Disposition: A | Discharge status: Other Acute Inpatient Hospital | Payer: BLUE CROSS/BLUE SHIELD | Source: Ambulatory Visit | Attending: Internal Medicine | Admitting: Internal Medicine

## 2015-04-23 ENCOUNTER — Emergency Department (HOSPITAL_BASED_OUTPATIENT_CLINIC_OR_DEPARTMENT_OTHER)
Admission: EM | Admit: 2015-04-23 | Discharge: 2015-04-23 | Disposition: A | Discharge status: Home / Self Care | Payer: BLUE CROSS/BLUE SHIELD | Source: Home / Self Care | Attending: Emergency Medicine | Admitting: Emergency Medicine

## 2015-04-23 ENCOUNTER — Emergency Department (HOSPITAL_COMMUNITY): Payer: BLUE CROSS/BLUE SHIELD | Admitting: Anesthesiology

## 2015-04-23 ENCOUNTER — Encounter (HOSPITAL_COMMUNITY): Payer: Self-pay | Admitting: General Practice

## 2015-04-23 ENCOUNTER — Encounter (HOSPITAL_COMMUNITY): Payer: Self-pay | Admitting: Family Medicine

## 2015-04-23 DIAGNOSIS — Y713 Surgical instruments, materials and cardiovascular devices (including sutures) associated with adverse incidents: Secondary | ICD-10-CM | POA: Diagnosis present

## 2015-04-23 DIAGNOSIS — K7200 Acute and subacute hepatic failure without coma: Secondary | ICD-10-CM

## 2015-04-23 DIAGNOSIS — I471 Supraventricular tachycardia: Secondary | ICD-10-CM | POA: Diagnosis present

## 2015-04-23 DIAGNOSIS — I5023 Acute on chronic systolic (congestive) heart failure: Secondary | ICD-10-CM | POA: Diagnosis present

## 2015-04-23 DIAGNOSIS — R000 Tachycardia, unspecified: Secondary | ICD-10-CM | POA: Diagnosis present

## 2015-04-23 DIAGNOSIS — I50810 Right heart failure, unspecified: Secondary | ICD-10-CM

## 2015-04-23 DIAGNOSIS — I4892 Unspecified atrial flutter: Secondary | ICD-10-CM | POA: Diagnosis present

## 2015-04-23 DIAGNOSIS — Z8774 Personal history of (corrected) congenital malformations of heart and circulatory system: Secondary | ICD-10-CM

## 2015-04-23 DIAGNOSIS — Z789 Other specified health status: Secondary | ICD-10-CM

## 2015-04-23 DIAGNOSIS — E871 Hypo-osmolality and hyponatremia: Secondary | ICD-10-CM | POA: Diagnosis not present

## 2015-04-23 DIAGNOSIS — N179 Acute kidney failure, unspecified: Secondary | ICD-10-CM

## 2015-04-23 DIAGNOSIS — B1920 Unspecified viral hepatitis C without hepatic coma: Secondary | ICD-10-CM | POA: Diagnosis present

## 2015-04-23 DIAGNOSIS — K529 Noninfective gastroenteritis and colitis, unspecified: Secondary | ICD-10-CM | POA: Diagnosis present

## 2015-04-23 DIAGNOSIS — R570 Cardiogenic shock: Secondary | ICD-10-CM

## 2015-04-23 DIAGNOSIS — Z7901 Long term (current) use of anticoagulants: Secondary | ICD-10-CM

## 2015-04-23 DIAGNOSIS — Z954 Presence of other heart-valve replacement: Secondary | ICD-10-CM

## 2015-04-23 DIAGNOSIS — B977 Papillomavirus as the cause of diseases classified elsewhere: Secondary | ICD-10-CM | POA: Diagnosis present

## 2015-04-23 DIAGNOSIS — Z23 Encounter for immunization: Secondary | ICD-10-CM

## 2015-04-23 DIAGNOSIS — Z95828 Presence of other vascular implants and grafts: Secondary | ICD-10-CM | POA: Insufficient documentation

## 2015-04-23 DIAGNOSIS — T82897A Other specified complication of cardiac prosthetic devices, implants and grafts, initial encounter: Principal | ICD-10-CM | POA: Diagnosis present

## 2015-04-23 DIAGNOSIS — T1490XA Injury, unspecified, initial encounter: Secondary | ICD-10-CM

## 2015-04-23 DIAGNOSIS — Z7982 Long term (current) use of aspirin: Secondary | ICD-10-CM

## 2015-04-23 DIAGNOSIS — R1110 Vomiting, unspecified: Secondary | ICD-10-CM

## 2015-04-23 LAB — CBC
HEMATOCRIT: 45 % (ref 36.0–46.0)
HEMATOCRIT: 41.5 % (ref 36.0–46.0)
HEMOGLOBIN: 13.4 g/dL (ref 12.0–15.0)
Hemoglobin: 14.5 g/dL (ref 12.0–15.0)
MCH: 29.5 pg (ref 26.0–34.0)
MCH: 29.5 pg (ref 26.0–34.0)
MCHC: 32.2 g/dL (ref 30.0–36.0)
MCHC: 32.3 g/dL (ref 30.0–36.0)
MCV: 91.6 fL (ref 78.0–100.0)
MCV: 91.2 fL (ref 78.0–100.0)
PLATELETS: 235 10*3/uL (ref 150–400)
Platelets: 222 10*3/uL (ref 150–400)
RBC: 4.91 MIL/uL (ref 3.87–5.11)
RBC: 4.55 MIL/uL (ref 3.87–5.11)
RDW: 14.9 % (ref 11.5–15.5)
RDW: 15 % (ref 11.5–15.5)
WBC: 9.3 10*3/uL (ref 4.0–10.5)
WBC: 10.9 10*3/uL — AB (ref 4.0–10.5)

## 2015-04-23 LAB — BASIC METABOLIC PANEL
ANION GAP: 14 (ref 5–15)
BUN: 34 mg/dL — AB (ref 6–20)
CHLORIDE: 98 mmol/L — AB (ref 101–111)
CO2: 22 mmol/L (ref 22–32)
Calcium: 8.8 mg/dL — ABNORMAL LOW (ref 8.9–10.3)
Creatinine, Ser: 1.56 mg/dL — ABNORMAL HIGH (ref 0.44–1.00)
GFR calc Af Amer: 48 mL/min — ABNORMAL LOW (ref >60)
GFR, EST NON AFRICAN AMERICAN: 41 mL/min — AB (ref >60)
GLUCOSE: 161 mg/dL — AB (ref 65–99)
POTASSIUM: 4 mmol/L (ref 3.5–5.1)
Sodium: 134 mmol/L — ABNORMAL LOW (ref 135–145)

## 2015-04-23 LAB — HEPATIC FUNCTION PANEL
ALBUMIN: 4 g/dL (ref 3.5–5.0)
ALT: 43 U/L (ref 14–54)
AST: 51 U/L — AB (ref 15–41)
Alkaline Phosphatase: 63 U/L (ref 38–126)
BILIRUBIN DIRECT: 0.3 mg/dL (ref 0.1–0.5)
BILIRUBIN TOTAL: 1.2 mg/dL (ref 0.3–1.2)
Indirect Bilirubin: 0.9 mg/dL (ref 0.3–0.9)
Total Protein: 7 g/dL (ref 6.5–8.1)

## 2015-04-23 LAB — PROTIME-INR
INR: 3.82 — AB (ref 0.00–1.49)
PROTHROMBIN TIME: 36.7 s — AB (ref 11.6–15.2)

## 2015-04-23 LAB — I-STAT CHEM 8, ED
BUN: 31 mg/dL — AB (ref 6–20)
CALCIUM ION: 1.19 mmol/L (ref 1.12–1.23)
CHLORIDE: 99 mmol/L — AB (ref 101–111)
Creatinine, Ser: 0.9 mg/dL (ref 0.44–1.00)
GLUCOSE: 156 mg/dL — AB (ref 65–99)
HCT: 48 % — ABNORMAL HIGH (ref 36.0–46.0)
Hemoglobin: 16.3 g/dL — ABNORMAL HIGH (ref 12.0–15.0)
POTASSIUM: 3.5 mmol/L (ref 3.5–5.1)
Sodium: 137 mmol/L (ref 135–145)
TCO2: 24 mmol/L (ref 0–100)

## 2015-04-23 LAB — BRAIN NATRIURETIC PEPTIDE: B Natriuretic Peptide: 300.5 pg/mL — ABNORMAL HIGH (ref 0.0–100.0)

## 2015-04-23 LAB — TSH: TSH: 2.585 u[IU]/mL (ref 0.350–4.500)

## 2015-04-23 MED ORDER — NORETHINDRONE-ETH ESTRADIOL 1-35 MG-MCG PO TABS: 1.0000 | ORAL_TABLET | Freq: Every day | ORAL | Status: DC

## 2015-04-23 MED ORDER — PROPOFOL 10 MG/ML IV BOLUS
INTRAVENOUS | Status: DC | PRN
  Administered 2015-04-23: 80 mg via INTRAVENOUS

## 2015-04-23 MED ORDER — FUROSEMIDE 40 MG PO TABS
40.0000 mg | ORAL_TABLET | Freq: Every day | ORAL | Status: DC
Start: 2015-04-24 — End: 2015-04-24

## 2015-04-23 MED ORDER — ACETAMINOPHEN 325 MG PO TABS
650.0000 mg | ORAL_TABLET | ORAL | Status: DC | PRN
  Administered 2015-04-24: 650 mg via ORAL
  Filled 2015-04-23: qty 2

## 2015-04-23 MED ORDER — ONDANSETRON HCL 4 MG/2ML IJ SOLN
4.0000 mg | Freq: Four times a day (QID) | INTRAMUSCULAR | Status: DC | PRN
  Administered 2015-04-23 – 2015-04-24 (×3): 4 mg via INTRAVENOUS
  Filled 2015-04-23 (×4): qty 2

## 2015-04-23 MED ORDER — ZOLPIDEM TARTRATE 5 MG PO TABS
5.0000 mg | ORAL_TABLET | Freq: Every evening | ORAL | Status: DC | PRN
  Administered 2015-04-25 (×2): 5 mg via ORAL
  Filled 2015-04-23 (×2): qty 1

## 2015-04-23 MED ORDER — SERTRALINE HCL 50 MG PO TABS
50.0000 mg | ORAL_TABLET | Freq: Every day | ORAL | Status: DC
  Administered 2015-04-24 – 2015-04-26 (×3): 50 mg via ORAL
  Filled 2015-04-23 (×3): qty 1

## 2015-04-23 MED ORDER — ACETAMINOPHEN 500 MG PO TABS: 1000.0000 mg | ORAL_TABLET | Freq: Four times a day (QID) | ORAL | Status: DC | PRN

## 2015-04-23 MED ORDER — ASPIRIN EC 81 MG PO TBEC
81.0000 mg | DELAYED_RELEASE_TABLET | Freq: Every day | ORAL | Status: DC
  Administered 2015-04-24 – 2015-04-25 (×2): 81 mg via ORAL
  Filled 2015-04-23 (×2): qty 1

## 2015-04-23 MED ORDER — SODIUM CHLORIDE 0.9 % IV SOLN
INTRAVENOUS | Status: AC
  Administered 2015-04-23: 21:00:00 via INTRAVENOUS

## 2015-04-23 MED ORDER — ONDANSETRON HCL 4 MG/2ML IJ SOLN
4.0000 mg | Freq: Once | INTRAMUSCULAR | Status: AC
  Administered 2015-04-23: 4 mg via INTRAVENOUS
  Filled 2015-04-23: qty 2

## 2015-04-23 MED ORDER — ASPIRIN 300 MG RE SUPP: 300.0000 mg | RECTAL | Status: AC

## 2015-04-23 MED ORDER — ASPIRIN 81 MG PO CHEW
324.0000 mg | CHEWABLE_TABLET | ORAL | Status: AC
  Administered 2015-04-23: 324 mg via ORAL
  Filled 2015-04-23: qty 4

## 2015-04-23 MED ORDER — HYDROCHLOROTHIAZIDE 25 MG PO TABS: 50.0000 mg | ORAL_TABLET | Freq: Every day | ORAL | Status: DC

## 2015-04-23 MED ORDER — SODIUM CHLORIDE 0.9 % IV BOLUS (SEPSIS)
1000.0000 mL | Freq: Once | INTRAVENOUS | Status: AC
  Administered 2015-04-23: 1000 mL via INTRAVENOUS

## 2015-04-23 MED ORDER — SPIRONOLACTONE 25 MG PO TABS
50.0000 mg | ORAL_TABLET | Freq: Every day | ORAL | Status: DC
Start: 2015-04-24 — End: 2015-04-24

## 2015-04-23 MED ORDER — INFLUENZA VAC SPLIT QUAD 0.5 ML IM SUSY
0.5000 mL | PREFILLED_SYRINGE | INTRAMUSCULAR | Status: AC
  Administered 2015-04-24: 0.5 mL via INTRAMUSCULAR
  Filled 2015-04-23: qty 0.5

## 2015-04-23 MED ORDER — LIDOCAINE HCL (CARDIAC) 20 MG/ML IV SOLN
INTRAVENOUS | Status: DC | PRN
  Administered 2015-04-23: 60 mg via INTRAVENOUS

## 2015-04-23 MED ORDER — ADULT MULTIVITAMIN W/MINERALS CH
1.0000 | ORAL_TABLET | ORAL | Status: DC
  Administered 2015-04-26: 1 via ORAL
  Filled 2015-04-23: qty 1

## 2015-04-23 MED ORDER — POTASSIUM CHLORIDE ER 10 MEQ PO TBCR
20.0000 meq | EXTENDED_RELEASE_TABLET | Freq: Two times a day (BID) | ORAL | Status: DC
  Administered 2015-04-23: 20 meq via ORAL
  Filled 2015-04-23 (×4): qty 2

## 2015-04-23 NOTE — Progress Notes (Signed)
Patient called earlier this afternoon.  Complained of HR in 120s  Sats 97  Feeling tired.   Instructed to come to office  EKG done  Appeared to show sinus tachycardia.   Patient felt poorly  Vomited x 2.  Recomm that she be admitted to [facility] for IV hydration  Continued telemetry  Follow up of BP and HR  Discussed with EP   Further Rx with depend on HR response  Consider low dose Ca blocker   If reverts to SVT may need reconsideration of sotalol.

## 2015-04-23 NOTE — ED Notes (Signed)
Pt here for A flutter with hx of the same. sts that she began having an irregular HB yesterday and then earlier this am she went into A flutter.

## 2015-04-23 NOTE — ED Notes (Signed)
Pt. Cardioverted by Dr. Harrington Challenger. Pt. Rhythm around 100 NSR post cardioversion.

## 2015-04-23 NOTE — Anesthesia Postprocedure Evaluation (Signed)
  Anesthesia Post-op Note  Patient: Jessica [name]  Procedure(s) Performed: * No procedures listed *  Patient Location: ed b17  Anesthesia Type:General  Level of Consciousness: awake, alert , oriented and patient cooperative  Airway and Oxygen Therapy: Patient Spontanous Breathing  Post-op Pain: none  Post-op Assessment: Post-op Vital signs reviewed, Patient's Cardiovascular Status Stable, Respiratory Function Stable, Patent Airway, No signs of Nausea or vomiting, Adequate PO intake, Pain level controlled, No headache and No backache              Post-op Vital Signs: Reviewed and stable  Last Vitals:  Filed Vitals:   04/23/15 0938  BP:   Pulse: 93  Temp:   Resp: 14    Complications: No apparent anesthesia complications

## 2015-04-23 NOTE — ED Provider Notes (Signed)
CSN: [id number]     Arrival date & time 04/23/15  0806 History   First MD Initiated Contact with Patient 04/23/15 [phone]     Chief Complaint  Patient presents with  . Atrial Flutter     (Consider location/radiation/quality/duration/timing/severity/associated sxs/prior Treatment) Patient is a 39 y.o. female presenting with palpitations.  Palpitations Palpitations quality:  Regular Onset quality:  Gradual Duration:  18 hours Timing:  Constant Progression:  Worsening Chronicity:  New Context: not anxiety, not blood loss, not caffeine and not exercise   Relieved by:  None tried Worsened by:  Nothing Ineffective treatments:  None tried Associated symptoms: shortness of breath and vomiting   Associated symptoms: no back pain, no cough, no diaphoresis, no leg pain and no nausea     Past Medical History  Diagnosis Date  . 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 Migraine   . Depression   . Hepatitis C   . Atrioventricular canal (AVC), complete     Has had 7 sternotomies for repair of this  . Acute on chronic heart failure     periprosthetic mitral valve leak  . H/O mitral valve replacement     a. x 3 -  most recent 06/2011 - [employer] MVR  . History of aortic valve replacement     a. 73mm SJM 1995  . H/O maze procedure     06/2011  . History of tricuspid valve repair     06/2011  . Cancer 06/2011  . Dyspareunia   . HPV (human papilloma virus) infection 2006    follows GYN.   Marland Kitchen AVNRT (AV nodal re-entry tachycardia)   . B12 deficiency 03/09/2010    Associated paravalvular leak    Past Surgical History  Procedure Laterality Date  . Aorto ventricular canal defect repair      age 26 mths  . Mitral valve replacement  06/2011    Third mitral valve replacement  . Aortic valve replacement    . Sternotomy      She has had 7 sternotomy  . Cardioversion  08/17/2011    Procedure: CARDIOVERSION;  Surgeon: Carlena Bjornstad, MD;  Location: [facility];  Service:  Cardiovascular;  Laterality: N/A;  . Cardioversion  09/11/2011    Procedure: CARDIOVERSION;  Surgeon: Fay Records, MD;  Location: [facility];  Service: Cardiovascular;  Laterality: N/A;  Carioversion  . Cardioversion  09/29/2011    Procedure: CARDIOVERSION;  Surgeon: Thayer Headings, MD;  Location: [facility];  Service: Cardiovascular;  Laterality: N/A;  . Wisdom tooth extraction  16 years ago  . Cardiac electrophysiology study and ablation Right 3/15    persisitant A flutter  . Cardiac electrophysiology mapping and ablation  8/15  . Wrist surgery      bilaterally torn cartilage  . Colposcopy     Family History  Problem Relation Age of Onset  . Colon polyps Father   . Hyperlipidemia Mother   . Depression Mother   . Thyroid disease Mother   . Hyperlipidemia Father   . Hypertension Father   . Colon cancer Paternal Grandmother    Social History  Substance Use Topics  . Smoking status: Never Smoker   . Smokeless tobacco: Never Used  . Alcohol Use: No   OB History    Gravida Para Term Preterm AB TAB SAB Ectopic Multiple Living   0 0 0 0 0 0 0 0 0 0      Review of Systems  Constitutional: Positive for activity change. Negative for diaphoresis and appetite change.  HENT: Negative for congestion.   Respiratory: Positive for shortness of breath. Negative for cough.   Cardiovascular: Positive for palpitations.  Gastrointestinal: Positive for vomiting. Negative for nausea and abdominal pain.  Endocrine: Negative for polydipsia and polyuria.  Genitourinary: Negative for dysuria.  Musculoskeletal: Negative for back pain.  Skin: Negative for pallor and wound.  All other systems reviewed and are negative.     Allergies  Betadine and Povidone-iodine  Home Medications   Prior to Admission medications   Medication Sig Start Date End Date Taking? Authorizing Lener Ventresca  acetaminophen (TYLENOL) 500 MG tablet Take 500 mg by mouth every 6 (six) hours as needed for mild pain.   Yes Historical  Gaetano Romberger, MD  aspirin 81 MG tablet Take 81 mg by mouth daily.     Yes Historical Ethanjames Fontenot, MD  doxycycline (VIBRAMYCIN) 100 MG capsule Take BID for 10 days.  Take with food as can cause GI distress. 04/06/15  Yes Salvadore Dom, MD  furosemide (LASIX) 40 MG tablet Take 1 tablet (40 mg total) by mouth daily. 02/26/15  Yes Fay Records, MD  hydrochlorothiazide (HYDRODIURIL) 50 MG tablet TAKE 1 TABLET BY MOUTH DAILY 03/01/15  Yes Fay Records, MD  norethindrone-ethinyl estradiol 1/35 (NECON 1/35, 28,) tablet Take 1 tablet by mouth daily. Continuous use. 04/16/15  Yes Marin Olp, MD  potassium chloride (K-DUR) 10 MEQ tablet TAKE 2 TABLETS BY MOUTH TWICE DAILY 03/03/15  Yes Marin Olp, MD  potassium chloride (K-DUR,KLOR-CON) 10 MEQ tablet Take 20 mEq by mouth daily.   Yes Historical Tonia Avino, MD  sertraline (ZOLOFT) 50 MG tablet Take 50 mg by mouth daily.   Yes Historical Brittinee Risk, MD  spironolactone (ALDACTONE) 50 MG tablet Take 1 tablet (50 mg total) by mouth daily. 02/26/15  Yes Fay Records, MD  warfarin (COUMADIN) 4 MG tablet Take 4 mg by mouth daily.   Yes Historical Zakara Parkey, MD  zaleplon (SONATA) 10 MG capsule Take 1 capsule (10 mg total) by mouth at bedtime as needed for sleep. 02/04/15  Yes Marletta Lor, MD  DULoxetine (CYMBALTA) 20 MG capsule Take 10 mg by mouth daily.    Historical Memori Sammon, MD  sertraline (ZOLOFT) 25 MG tablet Take 50 mg by mouth daily.     Historical Chaden Doom, MD  warfarin (COUMADIN) 4 MG tablet TAKE AS DIRECTED BY ANTICOAGULATION CLINIC 12/28/14   Marin Olp, MD   BP 106/67 mmHg  Pulse 106  Temp(Src) 98.6 F (37 C)  Resp 22  SpO2 100% Physical Exam  Constitutional: She is oriented to person, place, and time. She appears well-developed and well-nourished.  HENT:  Head: Normocephalic and atraumatic.  Eyes: Conjunctivae and EOM are normal. Right eye exhibits no discharge. Left eye exhibits no discharge.  Cardiovascular: Regular rhythm.  Tachycardia  present.   Pulmonary/Chest: Effort normal and breath sounds normal. No respiratory distress.  Abdominal: Soft. She exhibits no distension. There is no tenderness. There is no rebound.  Musculoskeletal: Normal range of motion. She exhibits no edema or tenderness.  Neurological: She is alert and oriented to person, place, and time.  Skin: Skin is warm and dry.  Nursing note and vitals reviewed.   ED Course  Procedures (including critical care time) Labs Review Labs Reviewed  PROTIME-INR - Abnormal; Notable for the following:    Prothrombin Time 36.7 (*)    INR 3.82 (*)    All other components within normal  limits  HEPATIC FUNCTION PANEL - Abnormal; Notable for the following:    AST 51 (*)    All other components within normal limits  BRAIN NATRIURETIC PEPTIDE - Abnormal; Notable for the following:    B Natriuretic Peptide 300.5 (*)    All other components within normal limits  I-STAT CHEM 8, ED - Abnormal; Notable for the following:    Chloride 99 (*)    BUN 31 (*)    Glucose, Bld 156 (*)    Hemoglobin 16.3 (*)    HCT 48.0 (*)    All other components within normal limits  CBC    Imaging Review No results found. I have personally reviewed and evaluated these images and lab results as part of my medical decision-making.   EKG Interpretation   Date/Time:  Friday April 23 2015 09:30:19 EDT Ventricular Rate:  95 PR Interval:  141 QRS Duration: 159 QT Interval:  412 QTC Calculation: 518 R Axis:   -110 Text Interpretation:  Sinus or ectopic atrial rhythm RBBB and LAFB Similar  to earlier today with improved rate Reconfirmed by [facility] MD, Corene Cornea  [phone]) on 04/23/2015 9:36:17 AM      MDM   Final diagnoses:  SVT (supraventricular tachycardia)   31 -year-old female with history of multiple cardiac surgeries presents to the ED today with a new superventricular tachycardia consistent with ectopic atrial tachycardia. I received a call from Dr. Harrington Challenger prior to the patient's  admission to the emergency department stating that when the patient arrived she wanted to see her. Her vital signs were stable besides her heart rate and is a little bit lightheaded likely secondary to the sustained tachycardia. Discussed case Dr. Harrington Challenger who came in to see her. Patient with significant cardiac history and heart surgeries making her not an ASA 1 sedation so cardiology consultated anesthesia who did sedation while cardiology did the cardioversion as charted. Patient was observed in the emergency department for a couple hours after the sedation and was asymptomatic with ambulation and at rest. Patient discharged follow with Dr. Harrington Challenger in office as scheduled.  I have personally and contemperaneously reviewed labs and imaging and used in my decision making as above.   A medical screening exam was performed and I feel the patient has had an appropriate workup for their chief complaint at this time and likelihood of emergent condition existing is low. They have been counseled on decision, discharge, follow up and which symptoms necessitate immediate return to the emergency department. They or their family verbally stated understanding and agreement with plan and discharged in stable condition.      Merrily Pew, MD 04/23/15 [phone]

## 2015-04-23 NOTE — Consult Note (Addendum)
Primary Physician: Primary Cardiologist:  Salvadore Dom Bishop Dublin)  Pt presents with heart racing  HPI: Pati ns a 39 yo with history of AV canal defect  She has received all her invasive cardiac care at [facility] (Dr Bishop [place], Waldron Session, Doyle Askew).  She had repair of   AV canal defect in early life. In May 1991 she underwent modified Konno operation with a 25 mm St Jude valve in the aortic position. In 1995 she had a 23 mm Carbomedics valve placed in the mitral position for a partially obstructed valve. 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 All surgeries were done at [facility]. In December 2012 she underwent MV replacement (3rd) with St Jude prosthesis. She also had TV reconstruction, MAZE procedure, isthmus ablation and reconstruction of her tricuspid valve.. Also had repair of ascending aortic tear. . She had an ablation of atrial flutter in March 2013 by Dr Jarrett Ables  Later in the spring she had problems with palpitations. Holter showed atrial ectopy, possible short bursts of atrial tach.She has been maintained on sotalol I saw her in May 2015 IN the summer she went back to [facility] for ablation of AVNRT               Sotalol was discontinued after her most recent ablation.  She has done very well over the past year.  Exercises.  Active  Just got back a few wks ago from an extended vacation in [place].  Yesterday at about 2 PM she said she felt her heart racing.  She denied dizziness  Felt winded with walking  She said that her heart rate went from 100 to 120 with walking    Sats in high 90s at rest  The high 80s wit hwalking   No dizziness.  She called last evening  Plan was to come in to clinic this AM for EKG Early this AM spoke with pt  She said that she was getting a little dizzy with walking  Felt tired. Told her to come to ER  She has not eatten since last night  Some watermelon as snack at 9 and dinner at 6.    Denies CP    Past Medical History  Diagnosis Date  .  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 Migraine   . Depression   . Hepatitis C   . Atrioventricular canal (AVC), complete     Has had 7 sternotomies for repair of this  . Acute on chronic heart failure     periprosthetic mitral valve leak  . H/O mitral valve replacement     a. x 3 -  most recent 06/2011 - St. Jude MVR  . History of aortic valve replacement     a. 22mm SJM 1995  . H/O maze procedure     06/2011  . History of tricuspid valve repair     06/2011  . Cancer 06/2011  . Dyspareunia   . HPV (human papilloma virus) infection 2006    follows GYN.   Marland Kitchen AVNRT (AV nodal re-entry tachycardia)   . B12 deficiency 03/09/2010    Associated paravalvular leak    MEDS at home    Dispense Refill    . aspirin 81 MG tablet Take 81 mg by mouth daily.     . DULoxetine (CYMBALTA) 20 MG capsule Take 10 mg by mouth daily.    . furosemide (LASIX) 40 MG tablet TAKE 1 TABLET  BY MOUTH EVERY DAY 30 tablet 0  . hydrochlorothiazide (HYDRODIURIL) 50 MG tablet TAKE 1 TABLET BY MOUTH DAILY 30 tablet 3  . NECON 1/35, 28, tablet TAKE 1 TABLET BY MOUTH EVERY DAY. SKIP PLACEBOS 4 Package 3  . potassium chloride (K-DUR) 10 MEQ tablet Take 2 tablets by mouth twice daily. 120 tablet 1  . sertraline (ZOLOFT) 25 MG tablet Take 37.5 mg by mouth daily.    Marland Kitchen spironolactone (ALDACTONE) 50 MG tablet Take 1 tablet (50 mg total) by mouth daily. 90 tablet 1  . warfarin (COUMADIN) 4 MG tablet TAKE AS DIRECTED BY ANTICOAGULATION CLINIC 40 tablet 2  . zaleplon (SONATA) 10 MG capsule Take 1 capsule (10 mg total) by mouth at bedtime as needed for sleep. 30 capsule 0           . ondansetron (ZOFRAN) IV  4 mg Intravenous Once    Infusions: . sodium chloride      Allergies  Allergen Reactions  . Betadine [Povidone Iodine] Rash  . Povidone-Iodine Other (See Comments)    Like sunburn. Really bad rash. Do not use for cath  procedures    Social History   Social History  . Marital Status: Single    Spouse Name: N/A  . Number of Children: N/A  . Years of Education: N/A   Occupational History  . legal specialist    Social History Main Topics  . Smoking status: Never Smoker   . Smokeless tobacco: Never Used  . Alcohol Use: No  . Drug Use: No  . Sexual Activity:    Partners: Male    Birth Control/ Protection: Pill     Comment: Necon 1/35   Other Topics Concern  . Not on file   Social History Narrative   Boyfriend of 4 years. Has been told not to have children. No plans for adoption.       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       Hobbies: dancing    Family History  Problem Relation Age of Onset  . Colon polyps Father   . Hyperlipidemia Mother   . Depression Mother   . Thyroid disease Mother   . Hyperlipidemia Father   . Hypertension Father   . Colon cancer Paternal Grandmother     REVIEW OF SYSTEMS:  All systems reviewed  Negative to the above problem except as noted above.    PHYSICAL EXAM: Filed Vitals:   04/23/15 0814  BP: 118/75  Pulse: 124  Temp: 98.6 F (37 C)  Resp: 18    No intake or output data in the 24 hours ending 04/23/15 0831  General:  Well appearing. No respiratory difficulty Skin:  Sl yellow coloration of face  HEENT: normal  Conjunctiva normal coloration   Neck: supple. no JVD. Carotids 2+ bilat; no bruits. No lymphadenopathy or thryomegaly appreciated. Cor: PMI nondisplaced. Crisp valve sounds Lungs: clear Abdomen: soft, nontender, nondistended. No hepatosplenomegaly. No bruits or masses. Good bowel sounds. Extremities: no cyanosis, clubbing,  edema  Scabs from mosquito bites   Neuro: alert & oriented x 3, cranial nerves grossly intact. moves all 4 extremities w/o difficulty. Affect pleasant.  ECG:  SVT, probable atrial flutter vs atrial tachycardia.  125 bpm  RBBB.  LAFB.      ASSESSMENT: 39 yo with Hx of AV canal  defect, s/p multiple surgeries. She has a history of atrial arrhythmias (atrial tach, atrial flutter).  Her last ablation was August 2015  Presents  now with recurrence. Last echo in January in [place] LVEF was normal   She is tired, a little dizzy Exam signif for Sl jaundiced coloring of skin.  Lungs are clear Valve sounds crisp  No edema EKG as noted.    Will plan on electve cardioversion.  I have contacted anesthesia for help with sedation.   Will discuss with EP plans for following off of antiarrhythmic  She has done well over past year and sotalol did make her fatigued.  Will check LFTs She does have a history of Hep C  She notes her skin coloration is different today than yesterday .

## 2015-04-23 NOTE — Progress Notes (Signed)
Patient educated on Bed Alarm and safety, is refusing Bed Alarm. Has been using Call Light appropriately.

## 2015-04-23 NOTE — CV Procedure (Addendum)
Elective Cardioversion  Pt anesthetized by anesthesia with Propofol  With pads in AP position patient cardioverted to SR with 200 J synchronized biphasic energy   12 lead EKG pending  Procedure without complications.

## 2015-04-23 NOTE — Progress Notes (Addendum)
Have set up echo for Thursday October 6 at 3 PM   Keep on same  meds for now.  Pt took coumadin this AM  Will hold in AM  Will take 2 mg Sunday  Has appt for INR on Monday.    OK to d/c home  WIll be in touch with pt.

## 2015-04-23 NOTE — Progress Notes (Signed)
ANTICOAGULATION CONSULT NOTE - Initial Consult  Pharmacy Consult for Warfarin Indication: St Jude MVR / Aflutter  Allergies  Allergen Reactions  . Betadine [Povidone Iodine] Rash  . Povidone-Iodine Other (See Comments)    Like sunburn. Really bad rash. Do not use for cath procedures    Patient Measurements: Height: 5\' 4"  (162.6 cm) Weight: 141 lb 12.8 oz (64.32 kg) IBW/kg (Calculated) : 54.7   Vital Signs: Temp: 98.6 F (37 C) (09/30 2059) Temp Source: Oral (09/30 2059) BP: 94/55 mmHg (09/30 2059) Pulse Rate: 96 (09/30 2059)  Labs:  Recent Labs  04/23/15 0830 04/23/15 0841  HGB 14.5 16.3*  HCT 45.0 48.0*  PLT 235  --   LABPROT 36.7*  --   INR 3.82*  --   CREATININE  --  0.90    Estimated Creatinine Clearance: 73.2 mL/min (by C-G formula based on Cr of 0.9).   Medical History: Past Medical History  Diagnosis Date  . 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 Migraine   . Depression   . Hepatitis C   . Atrioventricular canal (AVC), complete     Has had 7 sternotomies for repair of this  . Acute on chronic heart failure     periprosthetic mitral valve leak  . H/O mitral valve replacement     a. x 3 -  most recent 06/2011 - St. Jude MVR  . History of aortic valve replacement     a. 8mm SJM 1995  . H/O maze procedure     06/2011  . History of tricuspid valve repair     06/2011  . Cancer 06/2011  . Dyspareunia   . HPV (human papilloma virus) infection 2006    follows GYN.   Marland Kitchen AVNRT (AV nodal re-entry tachycardia)   . B12 deficiency 03/09/2010    Associated paravalvular leak       Assessment: 39yof with Hx MVR and Aflutter ablation 3/13.  She presents with tachycardia, N/V.  Plan to hydrate and discuss with EP.  Warfarin PTA 4mg  daily  INR 3.82 and patient has taken warfarin for today.    Goal of Therapy:  INR 2.5-3.5 Monitor platelets by anticoagulation protocol: Yes   Plan:  Pt has already taken warfarin dose today   Daily INR  Bonnita Nasuti Pharm.D. CPP, BCPS Clinical Pharmacist [phone] 04/23/2015 10:27 PM

## 2015-04-23 NOTE — Transfer of Care (Signed)
Immediate Anesthesia Transfer of Care Note  Patient: Jessica [name]  Procedure(s) Performed: * No procedures listed *  Patient Location: ed b17  Anesthesia Type:General  Level of Consciousness: awake, alert , oriented and patient cooperative  Airway & Oxygen Therapy: Patient Spontanous Breathing and Patient connected to nasal cannula oxygen  Post-op Assessment: Report given to RN and Post -op Vital signs reviewed and stable  Post vital signs: Reviewed and stable  Last Vitals:  Filed Vitals:   04/23/15 0938  BP:   Pulse: 93  Temp:   Resp: 14    Complications: No apparent anesthesia complications

## 2015-04-23 NOTE — ED Notes (Addendum)
Anesthesia and Dr. Harrington Challenger at bedside for cardioversion for atrial flutter. Anesthesia to document medications and vital signs q67min during procedure.

## 2015-04-23 NOTE — ED Notes (Signed)
Pt. O2 sats on RA while ambulating 99-100%. Pt. HR 116. Denies dizziness/lightheadedness while ambulating.

## 2015-04-24 ENCOUNTER — Other Ambulatory Visit: Payer: Self-pay

## 2015-04-24 ENCOUNTER — Observation Stay (HOSPITAL_COMMUNITY): Payer: BLUE CROSS/BLUE SHIELD

## 2015-04-24 ENCOUNTER — Observation Stay (HOSPITAL_BASED_OUTPATIENT_CLINIC_OR_DEPARTMENT_OTHER): Payer: BLUE CROSS/BLUE SHIELD

## 2015-04-24 DIAGNOSIS — I340 Nonrheumatic mitral (valve) insufficiency: Secondary | ICD-10-CM | POA: Diagnosis not present

## 2015-04-24 DIAGNOSIS — I509 Heart failure, unspecified: Secondary | ICD-10-CM

## 2015-04-24 DIAGNOSIS — K7200 Acute and subacute hepatic failure without coma: Secondary | ICD-10-CM | POA: Diagnosis not present

## 2015-04-24 DIAGNOSIS — R570 Cardiogenic shock: Secondary | ICD-10-CM

## 2015-04-24 DIAGNOSIS — N179 Acute kidney failure, unspecified: Secondary | ICD-10-CM | POA: Diagnosis not present

## 2015-04-24 DIAGNOSIS — Z789 Other specified health status: Secondary | ICD-10-CM | POA: Diagnosis not present

## 2015-04-24 DIAGNOSIS — R000 Tachycardia, unspecified: Secondary | ICD-10-CM | POA: Diagnosis not present

## 2015-04-24 DIAGNOSIS — I5023 Acute on chronic systolic (congestive) heart failure: Secondary | ICD-10-CM | POA: Diagnosis not present

## 2015-04-24 LAB — URINALYSIS, ROUTINE W REFLEX MICROSCOPIC
Glucose, UA: NEGATIVE mg/dL
KETONES UR: NEGATIVE mg/dL
NITRITE: NEGATIVE
PROTEIN: 30 mg/dL — AB
SPECIFIC GRAVITY, URINE: 1.023 (ref 1.005–1.030)
UROBILINOGEN UA: 1 mg/dL (ref 0.0–1.0)
pH: 5 (ref 5.0–8.0)

## 2015-04-24 LAB — PROTIME-INR
INR: 5.4 — AB (ref 0.00–1.49)
PROTHROMBIN TIME: 47.5 s — AB (ref 11.6–15.2)

## 2015-04-24 LAB — COMPREHENSIVE METABOLIC PANEL
ALK PHOS: 48 U/L (ref 38–126)
ALT: 1130 U/L — AB (ref 14–54)
AST: 839 U/L — ABNORMAL HIGH (ref 15–41)
Albumin: 3.6 g/dL (ref 3.5–5.0)
Anion gap: 14 (ref 5–15)
BILIRUBIN TOTAL: 2.6 mg/dL — AB (ref 0.3–1.2)
BUN: 51 mg/dL — ABNORMAL HIGH (ref 6–20)
CALCIUM: 8.5 mg/dL — AB (ref 8.9–10.3)
CO2: 21 mmol/L — AB (ref 22–32)
CREATININE: 2.39 mg/dL — AB (ref 0.44–1.00)
Chloride: 99 mmol/L — ABNORMAL LOW (ref 101–111)
GFR calc non Af Amer: 25 mL/min — ABNORMAL LOW (ref >60)
GFR, EST AFRICAN AMERICAN: 29 mL/min — AB (ref >60)
GLUCOSE: 140 mg/dL — AB (ref 65–99)
Potassium: 4.2 mmol/L (ref 3.5–5.1)
SODIUM: 134 mmol/L — AB (ref 135–145)
TOTAL PROTEIN: 6.2 g/dL — AB (ref 6.5–8.1)

## 2015-04-24 LAB — AMYLASE: AMYLASE: 25 U/L — AB (ref 28–100)

## 2015-04-24 LAB — BASIC METABOLIC PANEL
Anion gap: 14 (ref 5–15)
BUN: 43 mg/dL — AB (ref 6–20)
CALCIUM: 8.4 mg/dL — AB (ref 8.9–10.3)
CO2: 21 mmol/L — ABNORMAL LOW (ref 22–32)
CREATININE: 1.86 mg/dL — AB (ref 0.44–1.00)
Chloride: 95 mmol/L — ABNORMAL LOW (ref 101–111)
GFR, EST AFRICAN AMERICAN: 39 mL/min — AB (ref >60)
GFR, EST NON AFRICAN AMERICAN: 33 mL/min — AB (ref >60)
Glucose, Bld: 160 mg/dL — ABNORMAL HIGH (ref 65–99)
Potassium: 4.1 mmol/L (ref 3.5–5.1)
SODIUM: 130 mmol/L — AB (ref 135–145)

## 2015-04-24 LAB — URINE MICROSCOPIC-ADD ON

## 2015-04-24 LAB — MAGNESIUM: MAGNESIUM: 1.5 mg/dL — AB (ref 1.7–2.4)

## 2015-04-24 LAB — LIPASE, BLOOD: Lipase: 19 U/L — ABNORMAL LOW (ref 22–51)

## 2015-04-24 LAB — HCG, QUANTITATIVE, PREGNANCY: hCG, Beta Chain, Quant, S: 1 m[IU]/mL (ref <5)

## 2015-04-24 LAB — ABO/RH: ABO/RH(D): O POS

## 2015-04-24 MED ORDER — SODIUM CHLORIDE 0.9 % IV SOLN
INTRAVENOUS | Status: DC
  Administered 2015-04-24 (×2): via INTRAVENOUS

## 2015-04-24 MED ORDER — MILRINONE IN DEXTROSE 20 MG/100ML IV SOLN
0.3750 ug/kg/min | INTRAVENOUS | Status: DC
  Administered 2015-04-24 – 2015-04-25 (×2): 0.25 ug/kg/min via INTRAVENOUS
  Administered 2015-04-26: 0.5 ug/kg/min via INTRAVENOUS
  Administered 2015-04-26: 0.375 ug/kg/min via INTRAVENOUS
  Filled 2015-04-24 (×3): qty 100

## 2015-04-24 MED ORDER — SODIUM CHLORIDE 0.9 % IV SOLN
Freq: Once | INTRAVENOUS | Status: AC
Start: 2015-04-24 — End: 2015-04-26
  Administered 2015-04-26: 13:00:00 via INTRAVENOUS

## 2015-04-24 MED ORDER — ONDANSETRON HCL 4 MG/2ML IJ SOLN
4.0000 mg | Freq: Four times a day (QID) | INTRAMUSCULAR | Status: DC
  Administered 2015-04-24 – 2015-04-26 (×9): 4 mg via INTRAVENOUS
  Filled 2015-04-24 (×9): qty 2

## 2015-04-24 MED ORDER — ONDANSETRON HCL 4 MG/2ML IJ SOLN
4.0000 mg | Freq: Once | INTRAMUSCULAR | Status: AC
  Administered 2015-04-24: 4 mg via INTRAVENOUS

## 2015-04-24 MED ORDER — RISAQUAD PO CAPS
1.0000 | ORAL_CAPSULE | Freq: Every day | ORAL | Status: DC
  Administered 2015-04-24 – 2015-04-26 (×3): 1 via ORAL
  Filled 2015-04-24 (×4): qty 1

## 2015-04-24 MED ORDER — SODIUM CHLORIDE 0.9 % IV SOLN: INTRAVENOUS | Status: DC

## 2015-04-24 MED ORDER — NOREPINEPHRINE BITARTRATE 1 MG/ML IV SOLN
0.0000 ug/min | INTRAVENOUS | Status: DC
  Administered 2015-04-25: 10 ug/min via INTRAVENOUS
  Administered 2015-04-25: 5 ug/min via INTRAVENOUS
  Filled 2015-04-24 (×3): qty 4

## 2015-04-24 MED ORDER — LORAZEPAM 2 MG/ML IJ SOLN
0.5000 mg | Freq: Once | INTRAMUSCULAR | Status: AC
  Administered 2015-04-24: 0.5 mg via INTRAVENOUS
  Filled 2015-04-24: qty 1

## 2015-04-24 MED ORDER — LORAZEPAM 2 MG/ML IJ SOLN
0.5000 mg | Freq: Four times a day (QID) | INTRAMUSCULAR | Status: DC | PRN
  Administered 2015-04-26 (×3): 0.5 mg via INTRAVENOUS
  Filled 2015-04-24 (×3): qty 1

## 2015-04-24 MED ORDER — CALCIUM CARBONATE ANTACID 500 MG PO CHEW
200.0000 mg | CHEWABLE_TABLET | Freq: Two times a day (BID) | ORAL | Status: DC | PRN
  Administered 2015-04-24 – 2015-04-25 (×3): 200 mg via ORAL
  Filled 2015-04-24 (×3): qty 1

## 2015-04-24 MED ORDER — LORAZEPAM 2 MG/ML IJ SOLN: 0.5000 mg | Freq: Four times a day (QID) | INTRAMUSCULAR | Status: DC

## 2015-04-24 MED ORDER — ATROPINE SULFATE 0.1 MG/ML IJ SOLN
INTRAMUSCULAR | Status: AC
Start: 2015-04-24 — End: 2015-04-25
  Filled 2015-04-24: qty 10

## 2015-04-24 MED ORDER — SOTALOL HCL 80 MG PO TABS
80.0000 mg | ORAL_TABLET | Freq: Two times a day (BID) | ORAL | Status: DC
  Administered 2015-04-24: 80 mg via ORAL
  Filled 2015-04-24 (×2): qty 1

## 2015-04-24 NOTE — Progress Notes (Signed)
Dr. Harrington Challenger at bedside, turned IV fluids to 552ml/hr

## 2015-04-24 NOTE — Progress Notes (Signed)
Paged Dr. Percival Spanish, patient wanting Tums for indigestion. Ok to [employer].

## 2015-04-24 NOTE — Progress Notes (Deleted)
Was the fall witnessed: no  Patient condition before and after the fall: Patient was fully independent until event of fall. Patient vomtting, straining  Heavily in bathroom, and had syncopal episode. Family present in room but not in bathroom with patient.   Patient's reaction to the fall: Patient fell and hit face/head. Patient then escorted from bathroom to bed in wheelchair.   Name of the doctor that was notified including date and time: Dorene Ar and [facility] informed 1348 and ordered stat head CT scan and additional Zofran  Any interventions and vital signs:   04/24/15 1348  Vitals  Temp 97.6 F (36.4 C)  Temp Source Oral  BP (!) 94/57 mmHg  Pulse Rate 100  Resp 16  Oxygen Therapy  SpO2 100 %  O2 Device Room Air        Head CT was normal.

## 2015-04-24 NOTE — Progress Notes (Signed)
   04/24/15 1215  Vitals  Temp 97.6 F (36.4 C)  Temp Source Oral  BP 106/72 mmHg  Pulse Rate (!) 57  ECG Heart Rate (!) 57  Resp 14  Oxygen Therapy  SpO2 98 %  O2 Device Room Air   Was the fall witnessed: no  Patient condition before and after the fall: Patient was fully independent until event of fall. Patient vomtting, straining Heavily in bathroom, and had syncopal episode. Family present in room but not in bathroom with patient.   Patient's reaction to the fall: Patient fell and hit face/head. Patient then escorted from bathroom to bed in wheelchair.   Name of the doctor that was notified including date and time: Dorene Ar and [place] informed 1220 04/24/15 and ordered stat head CT scan and additional Zofran IV.

## 2015-04-24 NOTE — Progress Notes (Signed)
ANTICOAGULATION CONSULT NOTE   Pharmacy Consult for Warfarin Indication: St Jude MVR / Aflutter  Allergies  Allergen Reactions  . Betadine [Povidone Iodine] Rash  . Povidone-Iodine Other (See Comments)    Like sunburn. Really bad rash. Do not use for cath procedures    Patient Measurements: Height: 5\' 4"  (162.6 cm) Weight: 142 lb 9.6 oz (64.683 kg) IBW/kg (Calculated) : 54.7   Vital Signs: Temp: 98.2 F (36.8 C) (10/01 0506) Temp Source: Oral (10/01 0506) BP: 109/62 mmHg (10/01 0747) Pulse Rate: 107 (10/01 0506)  Labs:  Recent Labs  04/23/15 0830 04/23/15 0841 04/23/15 2222 04/24/15 0601  HGB 14.5 16.3* 13.4  --   HCT 45.0 48.0* 41.5  --   PLT 235  --  222  --   LABPROT 36.7*  --   --  47.5*  INR 3.82*  --   --  5.40*  CREATININE  --  0.90 1.56*  --     Estimated Creatinine Clearance: 42.2 mL/min (by C-G formula based on Cr of 1.56).   Medical History: Past Medical History  Diagnosis Date  . 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 Migraine   . Depression   . Hepatitis C   . Atrioventricular canal (AVC), complete     Has had 7 sternotomies for repair of this  . Acute on chronic heart failure     periprosthetic mitral valve leak  . H/O mitral valve replacement     a. x 3 -  most recent 06/2011 - St. Jude MVR  . History of aortic valve replacement     a. 56mm SJM 1995  . H/O maze procedure     06/2011  . History of tricuspid valve repair     06/2011  . Cancer 06/2011  . Dyspareunia   . HPV (human papilloma virus) infection 2006    follows GYN.   Marland Kitchen AVNRT (AV nodal re-entry tachycardia)   . B12 deficiency 03/09/2010    Associated paravalvular leak       Assessment: 39yof with Hx MVR and Aflutter ablation 3/13.  She presents with tachycardia, N/V.  Plan to hydrate and discuss with EP.  Warfarin PTA 4mg  daily  INR 3.82 on admission w/ PTA dose taken before presenting to ED.   INR 5.4. Plts, H/H WNL. No bleeding noted.    Goal of Therapy:  INR 2.5-3.5 Monitor platelets by anticoagulation protocol: Yes   Plan:  Hold warfarin today Daily INR Monitor s/sx bleeding and CBC  Joya San, PharmD Clinical Pharmacy Resident Pager # [phone] 04/24/2015 8:17 AM

## 2015-04-24 NOTE — Progress Notes (Signed)
Patient transferred to [facility] with Zoll Monitor, RN, Nurse Tech, Family and belongings at bedside. Bedside report given to RN. Dr. Harrington Challenger at bedside.

## 2015-04-24 NOTE — Progress Notes (Signed)
Received call from Dr. Harrington Challenger and she would like patient transferred to CCU. I let her know of BP being 83/59 and received order to run IV fluids open. I updated patient and family at bedside of transfer.

## 2015-04-24 NOTE — Progress Notes (Signed)
Patient seen.  Still not feeling good Tired  WInded with walking  Patient with some loose bowel movements   Nausea but not vomiting. Will hold diuretic today until PO intake improves. Keep hydration until mid afternoon then stop. Will order probiotic given recent antibiotic use (Just completed course of Doxycycline as ordered by Gyn for dyspareunia

## 2015-04-24 NOTE — H&P (Addendum)
Primary Care Physician: Garret Reddish, MD Referring Physician:  Admit Date: 04/23/2015  Reason for consultation:  Jessica [name] is a 39 y.o. female with a h/o AV canal defect in early life. In May 1991 she underwent modified Konno operation with a 25 mm St Jude valve in the aortic position. In 1995 she had a 23 mm Carbomedics valve placed in the mitral position for a partially obstructed valve. 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 All surgeries were done at [facility]. In December 2012 she underwent MV replacement (3rd) with St Jude prosthesis. She also had TV reconstruction, MAZE procedure, isthmus ablation and reconstruction of her tricuspid valve.. Also had repair of ascending aortic tear. . She had an ablation of atrial flutter in March 2013 by Dr Jarrett Ables  Later in the spring she had problems with palpitations. Holter showed atrial ectopy, possible short bursts of atrial tach.She has been maintained on sotalol I saw her in May 2015 IN the summer she went back to [facility] for ablation of AVNRT.  She had cardioversion yesterday for recurrence of AT vs flutter.  After that she returned to clinic with HR in the 120s and feeling fatigued with vomiting x2.  She was admitted for hydration.  Today, continuing to feel fatigued.  Appears to potentially be back in tachycardia.  Today, she denies symptoms of palpitations, chest pain, shortness of breath, orthopnea, PND, lower extremity edema, dizziness, presyncope, syncope, or neurologic sequela. The patient is tolerating medications without difficulties and is otherwise without complaint today.   Past Medical History  Diagnosis Date  . 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 Migraine   . Depression   . Hepatitis C   . Atrioventricular canal (AVC), complete     Has had 7 sternotomies for repair of this  . Acute on chronic heart failure     periprosthetic mitral valve leak  . H/O mitral valve replacement      a. x 3 -  most recent 06/2011 - St. Jude MVR  . History of aortic valve replacement     a. 52mm SJM 1995  . H/O maze procedure     06/2011  . History of tricuspid valve repair     06/2011  . Cancer 06/2011  . Dyspareunia   . HPV (human papilloma virus) infection 2006    follows GYN.   Marland Kitchen AVNRT (AV nodal re-entry tachycardia)   . B12 deficiency 03/09/2010    Associated paravalvular leak    Past Surgical History  Procedure Laterality Date  . Aorto ventricular canal defect repair      age 14 mths  . Mitral valve replacement  06/2011    Third mitral valve replacement  . Aortic valve replacement  1995    44mm SJM Archie Endo 04/23/2015  . Sternotomy  X 7  . Cardioversion  08/17/2011    Procedure: CARDIOVERSION;  Surgeon: Carlena Bjornstad, MD;  Location: [facility];  Service: Cardiovascular;  Laterality: N/A;  . Cardioversion  09/11/2011    Procedure: CARDIOVERSION;  Surgeon: Fay Records, MD;  Location: [place];  Service: Cardiovascular;  Laterality: N/A;  Carioversion  . Cardioversion  09/29/2011    Procedure: CARDIOVERSION;  Surgeon: Thayer Headings, MD;  Location: [place];  Service: Cardiovascular;  Laterality: N/A;  . Wisdom tooth extraction  16 years ago  . Cardiac electrophysiology study and ablation Right 09/2013    persisitant A flutter  . Cardiac electrophysiology  mapping and ablation  02/2014  . Wrist surgery Bilateral     torn cartilage  . Colposcopy      . aspirin EC  81 mg Oral Daily  . hydrochlorothiazide  50 mg Oral Daily  . Influenza vac split quadrivalent PF  0.5 mL Intramuscular Tomorrow-1000  . [START ON 04/26/2015] multivitamin with minerals  1 tablet Oral Once per day on Mon Wed Fri  . sertraline  50 mg Oral Daily  . spironolactone  50 mg Oral Daily   . sodium chloride 70 mL/hr at 04/23/15 2300    Allergies  Allergen Reactions  . Betadine [Povidone Iodine] Rash  . Povidone-Iodine Other (See Comments)    Like sunburn. Really bad rash. Do not use for cath procedures     Social History   Social History  . Marital Status: Single    Spouse Name: N/A  . Number of Children: N/A  . Years of Education: N/A   Occupational History  . legal specialist    Social History Main Topics  . Smoking status: Never Smoker   . Smokeless tobacco: Never Used  . Alcohol Use: No  . Drug Use: No  . Sexual Activity:    Partners: Male    Birth Control/ Protection: Pill     Comment: Necon 1/35   Other Topics Concern  . Not on file   Social History Narrative   Boyfriend of 4 years. Has been told not to have children. No plans for adoption.       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       Hobbies: dancing    Family History  Problem Relation Age of Onset  . Colon polyps Father   . Hyperlipidemia Mother   . Depression Mother   . Thyroid disease Mother   . Hyperlipidemia Father   . Hypertension Father   . Colon cancer Paternal Grandmother     ROS- All systems are reviewed and negative except as per the HPI above  Physical Exam: Telemetry: Filed Vitals:   04/23/15 2059 04/23/15 2300 04/24/15 0506 04/24/15 0747  BP: 94/55 102/40 96/60 109/62  Pulse: 96 110 107   Temp: 98.6 F (37 C) 98.2 F (36.8 C) 98.2 F (36.8 C)   TempSrc: Oral Oral Oral   Resp:  18    Height:      Weight:   142 lb 9.6 oz (64.683 kg)   SpO2: 94% 100% 100%     GEN- The patient is well appearing, alert and oriented x 3 today.   Head- normocephalic, atraumatic Eyes-  Sclera clear, conjunctiva pink Ears- hearing intact Oropharynx- clear Neck- supple, no JVP Lymph- no cervical lymphadenopathy Lungs- Clear to ausculation bilaterally, normal work of breathing Heart- Tachycardic, systolic click, continuous murmur loudest at LSUB GI- soft, NT, ND, + BS Extremities- no clubbing, cyanosis, or edema MS- no significant deformity or atrophy Skin- no rash or lesion Psych- euthymic mood, full affect Neuro- strength and sensation are intact  EKG-  atrial tachycardia, RBBB, LAFB, rate 120s  Labs:   Lab Results  Component Value Date   WBC 10.9* 04/23/2015   HGB 13.4 04/23/2015   HCT 41.5 04/23/2015   MCV 91.2 04/23/2015   PLT 222 04/23/2015    Recent Labs Lab 04/23/15 0830  04/23/15 2222  NA  --   < > 134*  K  --   < > 4.0  CL  --   < > 98*  CO2  --   --  22  BUN  --   < > 34*  CREATININE  --   < > 1.56*  CALCIUM  --   --  8.8*  PROT 7.0  --   --   BILITOT 1.2  --   --   ALKPHOS 63  --   --   ALT 43  --   --   AST 51*  --   --   GLUCOSE  --   < > 161*  < > = values in this interval not displayed. No results found for: CKTOTAL, CKMB, CKMBINDEX, TROPONINI Lab Results  Component Value Date   CHOL 179 02/15/2015   CHOL 178 11/16/2009   CHOL 144 05/18/2008   Lab Results  Component Value Date   HDL 38.50* 02/15/2015   HDL 37.00* 11/16/2009   HDL 28.6* 05/18/2008   Lab Results  Component Value Date   LDLCALC 110* 02/15/2015   LDLCALC 118* 11/16/2009   LDLCALC 98 05/18/2008   Lab Results  Component Value Date   TRIG 152.0* 02/15/2015   TRIG 117.0 11/16/2009   TRIG 88 05/18/2008   Lab Results  Component Value Date   CHOLHDL 5 02/15/2015   CHOLHDL 5 11/16/2009   CHOLHDL 5.0 CALC 05/18/2008   No results found for: LDLDIRECT   Echo: pending  ASSESSMENT AND PLAN:   Atrial tachycardia: has complete AV canal defect currently in sinus tachycardia after cardioversion yesterday.  Went back into AT post cardioversion.  Jessica [name] plan for hydration and sotalol for control.   AV canal defect: follow up at [facility].  If she requires ablation, would be done at that hospital.   Jessica Goedken Meredith Leeds, MD 04/24/2015  8:39 AM

## 2015-04-24 NOTE — CV Procedure (Deleted)
Started the echo, Ms. Arteaga became too sick to her stomach to continue the echo. Will attempt to finish later.   Darlina Sicilian RDCS, 04/24/2015 11:33

## 2015-04-24 NOTE — Progress Notes (Signed)
  Echocardiogram 2D Echocardiogram has been performed.  Darlina Sicilian M 04/24/2015, 11:41 AM

## 2015-04-24 NOTE — Consult Note (Addendum)
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 Please see cardiology notes for complete details. This has been surgically closed, with an endovascular repair. All of these have been performed at [facility]. She recently returned from a two week trip to [place], and began feeling unwell. She was found to have an atrial tachycardia in clinic this week, and was cardioverted. She continued to feel unwell, and presented to the ED. She began to develop nausea, vomiting, and diarrhea. She also developed hypotension and shock physiology. She was found to have a markedly elevated transaminases, as well as elevated INR and bilirubin. While vomiting today she had bradycardia and syncope with a fall. An echo showed increased flow through her fistula (L to R shunt). PCCM was called for to assist in working up her nausea, vomiting, and diarrhea.   PAST MEDICAL HISTORY :  Past Medical History  Diagnosis Date  . 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 Migraine   . Depression   . Hepatitis C   . Atrioventricular canal (AVC), complete     Has had 7 sternotomies for repair of this   . Acute on chronic heart failure     periprosthetic mitral valve leak  . H/O mitral valve replacement     a. x 3 -  most recent 06/2011 - [employer] MVR  . History of aortic valve replacement     a. 42mm SJM 1995  . H/O maze procedure     06/2011  . History of tricuspid valve repair     06/2011  . Cancer 06/2011  . Dyspareunia   . HPV (human papilloma virus) infection 2006    follows GYN.   Marland Kitchen AVNRT (AV nodal re-entry tachycardia)   . B12 deficiency 03/09/2010    Associated paravalvular leak    Past Surgical History  Procedure Laterality Date  . Aorto ventricular canal defect repair      age 71 mths  . Mitral valve replacement  06/2011    Third mitral valve replacement  . Aortic valve replacement  1995    34mm SJM Archie Endo 04/23/2015  . Sternotomy  X 7  . Cardioversion  08/17/2011    Procedure: CARDIOVERSION;  Surgeon: Carlena Bjornstad, MD;  Location: [place];  Service: Cardiovascular;  Laterality: N/A;  . Cardioversion  09/11/2011    Procedure: CARDIOVERSION;  Surgeon: Fay Records, MD;  Location: [place];  Service: Cardiovascular;  Laterality: N/A;  Carioversion  . Cardioversion  09/29/2011    Procedure: CARDIOVERSION;  Surgeon: Thayer Headings, MD;  Location: [place];  Service: Cardiovascular;  Laterality: N/A;  . Wisdom tooth extraction  16 years ago  .  Cardiac electrophysiology study and ablation Right 09/2013    persisitant A flutter  . Cardiac electrophysiology mapping and ablation  02/2014  . Wrist surgery Bilateral     torn cartilage  . Colposcopy     Prior to Admission medications   Medication Sig Start Date End Date Taking? Authorizing Provider  acetaminophen (TYLENOL) 500 MG tablet Take 1,000 mg by mouth every 6 (six) hours as needed for mild pain.    Yes Historical Provider, MD  aspirin EC 81 MG tablet Take 81 mg by mouth daily.   Yes Historical Provider, MD  furosemide (LASIX) 40 MG tablet Take 1 tablet (40 mg total) by mouth daily. 02/26/15  Yes Fay Records, MD   hydrochlorothiazide (HYDRODIURIL) 50 MG tablet TAKE 1 TABLET BY MOUTH DAILY 03/01/15  Yes Fay Records, MD  Multiple Vitamin (MULTIVITAMIN WITH MINERALS) TABS tablet Take 1 tablet by mouth 3 (three) times a week.   Yes Historical Provider, MD  norethindrone-ethinyl estradiol 1/35 (NECON 1/35, 28,) tablet Take 1 tablet by mouth daily. Continuous use. 04/16/15  Yes Marin Olp, MD  potassium chloride (K-DUR) 10 MEQ tablet TAKE 2 TABLETS BY MOUTH TWICE DAILY Patient taking differently: TAKE 2 TABLETS BY MOUTH DAILY 03/03/15  Yes Marin Olp, MD  sertraline (ZOLOFT) 50 MG tablet Take 50 mg by mouth daily.   Yes Historical Provider, MD  spironolactone (ALDACTONE) 50 MG tablet Take 1 tablet (50 mg total) by mouth daily. 02/26/15  Yes Fay Records, MD  warfarin (COUMADIN) 4 MG tablet Take 4-8 mg by mouth See admin instructions. Took 1 tablet (4 mg) on 04/23/15, skip 04/24/15 dose per Dr. Harrington Challenger, take 2 tablets (8 mg) on 04/25/15, then recheck INR on 04/26/15 for further instructions.   Yes Historical Provider, MD  zaleplon (SONATA) 10 MG capsule Take 1 capsule (10 mg total) by mouth at bedtime as needed for sleep. 02/04/15  Yes Marletta Lor, MD  warfarin (COUMADIN) 4 MG tablet TAKE AS DIRECTED BY ANTICOAGULATION CLINIC 12/28/14   Marin Olp, MD   Allergies  Allergen Reactions  . Betadine [Povidone Iodine] Rash  . Povidone-Iodine Other (See Comments)    Like sunburn. Really bad rash. Do not use for cath procedures    FAMILY HISTORY:  Family History  Problem Relation Age of Onset  . Colon polyps Father   . Hyperlipidemia Mother   . Depression Mother   . Thyroid disease Mother   . Hyperlipidemia Father   . Hypertension Father   . Colon cancer Paternal Grandmother    SOCIAL HISTORY:  reports that she has never smoked. She has never used smokeless tobacco. She reports that she does not drink alcohol or use illicit drugs.  REVIEW OF SYSTEMS:  Per HPI  SUBJECTIVE:   VITAL  SIGNS: Temp:  [97.3 F (36.3 C)-98.2 F (36.8 C)] 97.4 F (36.3 C) (10/02 0000) Pulse Rate:  [57-110] 108 (10/02 0015) Resp:  [14-32] 16 (10/02 0015) BP: (83-109)/(22-72) 108/54 mmHg (10/02 0015) SpO2:  [98 %-100 %] 99 % (10/02 0015) Weight:  [142 lb 9.6 oz (64.683 kg)-148 lb 13 oz (67.5 kg)] 148 lb 13 oz (67.5 kg) (10/01 2300) HEMODYNAMICS:   VENTILATOR SETTINGS:   INTAKE / OUTPUT: Intake/Output      10/01 0701 - 10/02 0700   P.O. 600   I.V. (mL/kg) 250 (3.7)   Blood 308   Total Intake(mL/kg) 1158 (17.2)   Urine (mL/kg/hr) 500 (0.3)   Total Output 500   Net +  658       Urine Occurrence 1 x     PHYSICAL EXAMINATION: General:  Jaundiced woman, vomiting Neuro:  Intact, fully awake & alert. HEENT:  MMM Neck: JVD to above mandible Cardiovascular:  Increased second heart sound Lungs:  Clear Abdomen:  Tenderness in RUQ with liver palpable 2 cm below costal margin. Spleen palpable. Slightly distended abdomen. Musculoskeletal:  No swollen joints Skin:  No rashes, but several lesions that appear to be mosquito bites.  LABS:  CBC  Recent Labs Lab 04/23/15 0830 04/23/15 0841 04/23/15 2222  WBC 9.3  --  10.9*  HGB 14.5 16.3* 13.4  HCT 45.0 48.0* 41.5  PLT 235  --  222   Coag's  Recent Labs Lab 04/23/15 0830 04/24/15 0601  INR 3.82* 5.40*   BMET  Recent Labs Lab 04/23/15 2222 04/24/15 1016 04/24/15 1846  NA 134* 130* 134*  K 4.0 4.1 4.2  CL 98* 95* 99*  CO2 22 21* 21*  BUN 34* 43* 51*  CREATININE 1.56* 1.86* 2.39*  GLUCOSE 161* 160* 140*   Electrolytes  Recent Labs Lab 04/23/15 2222 04/24/15 1016 04/24/15 1846  CALCIUM 8.8* 8.4* 8.5*  MG  --  1.5*  --    Sepsis Markers No results for input(s): LATICACIDVEN, PROCALCITON, O2SATVEN in the last 168 hours. ABG No results for input(s): PHART, PCO2ART, PO2ART in the last 168 hours. Liver Enzymes  Recent Labs Lab 04/23/15 0830 04/24/15 1846  AST 51* 839*  ALT 43 1130*  ALKPHOS 63 48   BILITOT 1.2 2.6*  ALBUMIN 4.0 3.6   Cardiac Enzymes No results for input(s): TROPONINI, PROBNP in the last 168 hours. Glucose No results for input(s): GLUCAP in the last 168 hours.  Imaging Ct Head Wo Contrast  04/24/2015   CLINICAL DATA:  Syncopal episode, fall, headache and vomiting  EXAM: CT HEAD WITHOUT CONTRAST  TECHNIQUE: Contiguous axial images were obtained from the base of the skull through the vertex without contrast.  COMPARISON:  03/11/2007  FINDINGS: Normal appearance of the intracranial structures. No evidence for acute hemorrhage, mass lesion, midline shift, hydrocephalus or large infarct. No acute bony abnormality. The visualized sinuses are clear.  IMPRESSION: No acute intracranial abnormality.   Electronically Signed   By: Jerilynn Mages.  Shick M.D.   On: 04/24/2015 13:26   Dg Abd Portable 1v  04/25/2015   CLINICAL DATA:  Acute onset of vomiting.  Initial encounter.  EXAM: PORTABLE ABDOMEN - 1 VIEW  COMPARISON:  Lumbar spine radiographs performed 03/03/2008  FINDINGS: The visualized bowel gas pattern is unremarkable. Scattered air filled loops of colon are seen; no abnormal dilatation of small bowel loops is seen to suggest small bowel obstruction. No free intra-abdominal air is identified, though evaluation for free air is limited on a single supine view. A small amount of air is noted within the stomach.  The visualized osseous structures are within normal limits; the sacroiliac joints are unremarkable in appearance. The patient is status post median sternotomy. The visualized lung bases are essentially clear.  IMPRESSION: Unremarkable bowel gas pattern; no free intra-abdominal air seen.   Electronically Signed   By: Garald Balding M.D.   On: 04/25/2015 00:42    EKG: Sinus tachycardia CXR: Clear lung parenchyma  ASSESSMENT / PLAN:  PULMONARY A: No active issues P:    CARDIOVASCULAR A: Complex R heart failure Atrial Arrythmia P:   Defer to primary team - agree with milrinone  for afterload reduction + increased RV contractility. Shunt physiology needs  to be addressed surgically.  RENAL A: Renal failure P:   Due to RV failure - address as above.  GASTROINTESTINAL A: Acute Hepatic Insufficiency  Diarrhea Vomiting P:   Currently mentating well, so not fulminant liver failure. Favor heart failure as dominant cause, although agree with checking viral etiology. Unclear cause for diarrhea. Nausea may be caused by liver/renal failure, but unifying diagnosis seems unclear. Difficult to attribute to RV failure alone. May be simple viral gastroenteritis, but no other sick contacts. Will check C. Diff (recent ABX exposure to doxycyline for dyspareunia tx). Fecal lactoferrin may be helpful to ID inflammatory cause vs non inflammatory causes of diarrhea.  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 P:   Treat underlying cause, defer to cards for whether or not to give vit K.  INFECTIOUS A: Possible infectious gastroenteritis P:   Will obtain C. Diff. Fecal lactoferrin / O&P.  ENDOCRINE A: No active issues P:    NEUROLOGIC A: No active issues P:    TODAY'S SUMMARY: 39 y/o woman with congenital heart disease now w/ acute RV failure due to L to R shunt.  I have personally obtained a history, examined the patient, evaluated laboratory and imaging results, formulated the assessment and plan and placed orders.  CRITICAL CARE: The patient is critically ill with multiple organ systems failure and requires high complexity decision making for assessment and support, frequent evaluation and titration of therapies, application of advanced monitoring technologies and extensive interpretation of multiple databases. Critical Care Time devoted to patient care services described in this note is 60 minutes.   Luz Brazen, MD Pulmonary and [place] Pager: [phone]   04/25/2015, 1:22 AM

## 2015-04-24 NOTE — Progress Notes (Signed)
Called by nurse pt was vomiting in the BR and passed out - her face hit something and she has bruising of forehead, nose cheek.  Oriented X 3 MAF equal grips and push pull, EOMs intact.  Pupils pinpoint and equal.  Teddy Rebstock proceed with CT non contrast of her head and with INR 5 Jackquelyn Sundberg give 2 units FFP.  Add ice to face continues to have nausea and vomiting. Repeated zofran.    Monitor with conversion to junct or SB to 30 with occ escape beat.  SR.      I have seen and examined this patient with Cecilie Kicks.  Agree with above, note added to reflect my findings.  Likely caused by vagal episode when using the restroom.  Kendle Erker plan on antinausea meds to hopefully counteract this.  She did have sotalol but vomited it up.  Bettyjo Lundblad get CT head due to contusion and may give FFP pending CT results for elevated INR.  Tammela Bales M. Giovannina Mun MD 04/24/2015 1:29 PM

## 2015-04-24 NOTE — Progress Notes (Signed)
Reviewed echo with T Turner.  Extra images have been added to study.  There appears to be a fistula at site of closure device.This is new.   I have discussed with Dr Sanjuan Dame at [facility].  Will get data or disc sent there this weekend or Monday  Tele shows HR 97  May be SR   For now would d/c sotalol  She has not retained anyway due to vomiting Continue IV fluids and Zofran Will check hepatitis serologies along with CMET, CBC, amlyase and lipase If diarrhea continues the C diff given recent use of ABx.

## 2015-04-25 ENCOUNTER — Observation Stay (HOSPITAL_COMMUNITY): Payer: BLUE CROSS/BLUE SHIELD

## 2015-04-25 DIAGNOSIS — Z7901 Long term (current) use of anticoagulants: Secondary | ICD-10-CM | POA: Diagnosis not present

## 2015-04-25 DIAGNOSIS — Z7982 Long term (current) use of aspirin: Secondary | ICD-10-CM | POA: Diagnosis not present

## 2015-04-25 DIAGNOSIS — Z789 Other specified health status: Secondary | ICD-10-CM | POA: Diagnosis not present

## 2015-04-25 DIAGNOSIS — R570 Cardiogenic shock: Secondary | ICD-10-CM | POA: Diagnosis not present

## 2015-04-25 DIAGNOSIS — Z23 Encounter for immunization: Secondary | ICD-10-CM | POA: Diagnosis not present

## 2015-04-25 DIAGNOSIS — I5023 Acute on chronic systolic (congestive) heart failure: Secondary | ICD-10-CM | POA: Diagnosis present

## 2015-04-25 DIAGNOSIS — K7200 Acute and subacute hepatic failure without coma: Secondary | ICD-10-CM | POA: Diagnosis not present

## 2015-04-25 DIAGNOSIS — I350 Nonrheumatic aortic (valve) stenosis: Secondary | ICD-10-CM | POA: Diagnosis not present

## 2015-04-25 DIAGNOSIS — I509 Heart failure, unspecified: Secondary | ICD-10-CM | POA: Diagnosis not present

## 2015-04-25 DIAGNOSIS — I50810 Right heart failure, unspecified: Secondary | ICD-10-CM

## 2015-04-25 DIAGNOSIS — N179 Acute kidney failure, unspecified: Secondary | ICD-10-CM | POA: Diagnosis not present

## 2015-04-25 DIAGNOSIS — I4892 Unspecified atrial flutter: Secondary | ICD-10-CM | POA: Diagnosis present

## 2015-04-25 DIAGNOSIS — T82897A Other specified complication of cardiac prosthetic devices, implants and grafts, initial encounter: Secondary | ICD-10-CM | POA: Diagnosis present

## 2015-04-25 DIAGNOSIS — Y713 Surgical instruments, materials and cardiovascular devices (including sutures) associated with adverse incidents: Secondary | ICD-10-CM | POA: Diagnosis present

## 2015-04-25 DIAGNOSIS — B1920 Unspecified viral hepatitis C without hepatic coma: Secondary | ICD-10-CM | POA: Diagnosis present

## 2015-04-25 DIAGNOSIS — E871 Hypo-osmolality and hyponatremia: Secondary | ICD-10-CM | POA: Diagnosis not present

## 2015-04-25 DIAGNOSIS — K529 Noninfective gastroenteritis and colitis, unspecified: Secondary | ICD-10-CM | POA: Diagnosis present

## 2015-04-25 DIAGNOSIS — I471 Supraventricular tachycardia: Secondary | ICD-10-CM | POA: Diagnosis present

## 2015-04-25 DIAGNOSIS — Z8774 Personal history of (corrected) congenital malformations of heart and circulatory system: Secondary | ICD-10-CM | POA: Diagnosis not present

## 2015-04-25 DIAGNOSIS — B977 Papillomavirus as the cause of diseases classified elsewhere: Secondary | ICD-10-CM | POA: Diagnosis present

## 2015-04-25 LAB — HEPATITIS PANEL, ACUTE
HCV Ab: 0.1 s/co ratio (ref 0.0–0.9)
Hep A IgM: NEGATIVE
Hep B C IgM: NEGATIVE
Hepatitis B Surface Ag: NEGATIVE

## 2015-04-25 LAB — COMPREHENSIVE METABOLIC PANEL
ALBUMIN: 3.4 g/dL — AB (ref 3.5–5.0)
ALBUMIN: 3.6 g/dL (ref 3.5–5.0)
ALK PHOS: 43 U/L (ref 38–126)
ALT: 1581 U/L — ABNORMAL HIGH (ref 14–54)
ALT: 1693 U/L — ABNORMAL HIGH (ref 14–54)
ALT: 1740 U/L — AB (ref 14–54)
ANION GAP: 15 (ref 5–15)
ANION GAP: 16 — AB (ref 5–15)
AST: 1127 U/L — ABNORMAL HIGH (ref 15–41)
AST: 1252 U/L — ABNORMAL HIGH (ref 15–41)
AST: 1110 U/L — AB (ref 15–41)
Albumin: 3.4 g/dL — ABNORMAL LOW (ref 3.5–5.0)
Alkaline Phosphatase: 45 U/L (ref 38–126)
Alkaline Phosphatase: 45 U/L (ref 38–126)
Anion gap: 13 (ref 5–15)
BILIRUBIN TOTAL: 2.3 mg/dL — AB (ref 0.3–1.2)
BUN: 56 mg/dL — ABNORMAL HIGH (ref 6–20)
BUN: 58 mg/dL — AB (ref 6–20)
BUN: 59 mg/dL — AB (ref 6–20)
CALCIUM: 7.7 mg/dL — AB (ref 8.9–10.3)
CALCIUM: 7.8 mg/dL — AB (ref 8.9–10.3)
CHLORIDE: 100 mmol/L — AB (ref 101–111)
CHLORIDE: 98 mmol/L — AB (ref 101–111)
CO2: 19 mmol/L — AB (ref 22–32)
CO2: 19 mmol/L — ABNORMAL LOW (ref 22–32)
CO2: 21 mmol/L — AB (ref 22–32)
Calcium: 8 mg/dL — ABNORMAL LOW (ref 8.9–10.3)
Chloride: 100 mmol/L — ABNORMAL LOW (ref 101–111)
Creatinine, Ser: 2.49 mg/dL — ABNORMAL HIGH (ref 0.44–1.00)
Creatinine, Ser: 2.5 mg/dL — ABNORMAL HIGH (ref 0.44–1.00)
Creatinine, Ser: 2.59 mg/dL — ABNORMAL HIGH (ref 0.44–1.00)
GFR calc Af Amer: 27 mL/min — ABNORMAL LOW (ref >60)
GFR calc Af Amer: 26 mL/min — ABNORMAL LOW (ref >60)
GFR calc non Af Amer: 23 mL/min — ABNORMAL LOW (ref >60)
GFR calc non Af Amer: 23 mL/min — ABNORMAL LOW (ref >60)
GFR calc non Af Amer: 22 mL/min — ABNORMAL LOW (ref >60)
GFR, EST AFRICAN AMERICAN: 27 mL/min — AB (ref >60)
GLUCOSE: 161 mg/dL — AB (ref 65–99)
GLUCOSE: 190 mg/dL — AB (ref 65–99)
Glucose, Bld: 154 mg/dL — ABNORMAL HIGH (ref 65–99)
POTASSIUM: 3.7 mmol/L (ref 3.5–5.1)
Potassium: 3.5 mmol/L (ref 3.5–5.1)
Potassium: 3.1 mmol/L — ABNORMAL LOW (ref 3.5–5.1)
SODIUM: 132 mmol/L — AB (ref 135–145)
SODIUM: 134 mmol/L — AB (ref 135–145)
Sodium: 135 mmol/L (ref 135–145)
TOTAL PROTEIN: 5.6 g/dL — AB (ref 6.5–8.1)
Total Bilirubin: 2.5 mg/dL — ABNORMAL HIGH (ref 0.3–1.2)
Total Bilirubin: 2 mg/dL — ABNORMAL HIGH (ref 0.3–1.2)
Total Protein: 5.8 g/dL — ABNORMAL LOW (ref 6.5–8.1)
Total Protein: 6 g/dL — ABNORMAL LOW (ref 6.5–8.1)

## 2015-04-25 LAB — CBC
HEMATOCRIT: 39.3 % (ref 36.0–46.0)
HEMOGLOBIN: 13.1 g/dL (ref 12.0–15.0)
MCH: 30.4 pg (ref 26.0–34.0)
MCHC: 33.3 g/dL (ref 30.0–36.0)
MCV: 91.2 fL (ref 78.0–100.0)
Platelets: 237 10*3/uL (ref 150–400)
RBC: 4.31 MIL/uL (ref 3.87–5.11)
RDW: 15.5 % (ref 11.5–15.5)
WBC: 14.4 10*3/uL — AB (ref 4.0–10.5)

## 2015-04-25 LAB — CARBOXYHEMOGLOBIN
CARBOXYHEMOGLOBIN: 1.2 % (ref 0.5–1.5)
Carboxyhemoglobin: 1.4 % (ref 0.5–1.5)
Carboxyhemoglobin: 1.3 % (ref 0.5–1.5)
Methemoglobin: 0.9 % (ref 0.0–1.5)
Methemoglobin: 0.9 % (ref 0.0–1.5)
Methemoglobin: 1 % (ref 0.0–1.5)
O2 SAT: 54.1 %
O2 SAT: 49.2 %
O2 Saturation: 52.8 %
TOTAL HEMOGLOBIN: 12.5 g/dL (ref 12.0–16.0)
Total hemoglobin: 13.4 g/dL (ref 12.0–16.0)
Total hemoglobin: 12.6 g/dL (ref 12.0–16.0)

## 2015-04-25 LAB — PROTIME-INR
INR: 5.9 (ref 0.00–1.49)
INR: 6.3 — AB (ref 0.00–1.49)
Prothrombin Time: 53.4 seconds — ABNORMAL HIGH (ref 11.6–15.2)
Prothrombin Time: 50.9 seconds — ABNORMAL HIGH (ref 11.6–15.2)

## 2015-04-25 LAB — C DIFFICILE QUICK SCREEN W PCR REFLEX
C DIFFICILE (CDIFF) INTERP: NEGATIVE
C Diff antigen: NEGATIVE
C Diff toxin: NEGATIVE

## 2015-04-25 LAB — MRSA PCR SCREENING: MRSA by PCR: NEGATIVE

## 2015-04-25 LAB — LACTIC ACID, PLASMA
LACTIC ACID, VENOUS: 2.4 mmol/L — AB (ref 0.5–2.0)
Lactic Acid, Venous: 2.8 mmol/L (ref 0.5–2.0)
Lactic Acid, Venous: 2.8 mmol/L (ref 0.5–2.0)

## 2015-04-25 LAB — LACTATE DEHYDROGENASE: LDH: 1276 U/L — ABNORMAL HIGH (ref 98–192)

## 2015-04-25 MED ORDER — FENTANYL CITRATE (PF) 100 MCG/2ML IJ SOLN
25.0000 ug | Freq: Once | INTRAMUSCULAR | Status: AC
  Administered 2015-04-25: 25 ug via INTRAVENOUS

## 2015-04-25 MED ORDER — ZOLPIDEM TARTRATE 5 MG PO TABS: 5.0000 mg | ORAL_TABLET | Freq: Every evening | ORAL | Status: DC | PRN

## 2015-04-25 MED ORDER — POTASSIUM CHLORIDE CRYS ER 10 MEQ PO TBCR
40.0000 meq | EXTENDED_RELEASE_TABLET | Freq: Once | ORAL | Status: AC
  Administered 2015-04-25: 40 meq via ORAL
  Filled 2015-04-25: qty 4

## 2015-04-25 MED ORDER — MIDAZOLAM HCL 2 MG/2ML IJ SOLN
1.0000 mg | Freq: Once | INTRAMUSCULAR | Status: AC
  Administered 2015-04-25: 1 mg via INTRAVENOUS

## 2015-04-25 MED ORDER — FUROSEMIDE 10 MG/ML IJ SOLN
20.0000 mg | Freq: Once | INTRAMUSCULAR | Status: DC
  Filled 2015-04-25: qty 2

## 2015-04-25 MED ORDER — MIDAZOLAM HCL 2 MG/2ML IJ SOLN
INTRAMUSCULAR | Status: AC
  Filled 2015-04-25: qty 2

## 2015-04-25 MED ORDER — FENTANYL CITRATE (PF) 100 MCG/2ML IJ SOLN
INTRAMUSCULAR | Status: AC
Start: 2015-04-25 — End: 2015-04-25
  Filled 2015-04-25: qty 2

## 2015-04-25 MED ORDER — NOREPINEPHRINE BITARTRATE 1 MG/ML IV SOLN
10.0000 ug/min | INTRAVENOUS | Status: DC
  Administered 2015-04-25 – 2015-04-26 (×2): 10 ug/min via INTRAVENOUS
  Filled 2015-04-25 (×2): qty 16

## 2015-04-25 MED ORDER — FUROSEMIDE 10 MG/ML IJ SOLN
80.0000 mg | Freq: Once | INTRAMUSCULAR | Status: AC
  Administered 2015-04-25: 80 mg via INTRAVENOUS

## 2015-04-25 MED ORDER — FUROSEMIDE 10 MG/ML IJ SOLN
80.0000 mg | Freq: Once | INTRAMUSCULAR | Status: AC
  Administered 2015-04-25: 80 mg via INTRAVENOUS
  Filled 2015-04-25: qty 8

## 2015-04-25 MED ORDER — POTASSIUM CHLORIDE CRYS ER 20 MEQ PO TBCR
40.0000 meq | EXTENDED_RELEASE_TABLET | Freq: Once | ORAL | Status: AC
  Administered 2015-04-25: 40 meq via ORAL
  Filled 2015-04-25: qty 2

## 2015-04-25 NOTE — Progress Notes (Signed)
PCCM PROGRESS NOTE  SUBJECTIVE: Still feels nauseous and having loose stools.  Denies chest pain, dyspnea.  OBJECTIVE: BP 108/54 mmHg  Pulse 108  Temp(Src) 97.9 F (36.6 C) (Oral)  Resp 16  Ht 5\' 4"  (1.626 m)  Wt 147 lb 11.3 oz (67 kg)  BMI 25.34 kg/m2  SpO2 99% I/O last 3 completed shifts: In: 1927.5 [P.O.:750; I.V.:869.5; Blood:308] Out: 1225 [Urine:1225]  General: pleasant HEENT: no sinus tenderness Cardiac: regular, tachycardic, 3/6 murmur Chest: no wheeze Abd: soft, mild epigastric tenderness Ext: no edema Neuro: alert, normal strength  CMP Latest Ref Rng 04/25/2015 04/25/2015 04/24/2015  Glucose 65 - 99 mg/dL 161(H) 154(H) 140(H)  BUN 6 - 20 mg/dL 58(H) 56(H) 51(H)  Creatinine 0.44 - 1.00 mg/dL 2.50(H) 2.49(H) 2.39(H)  Sodium 135 - 145 mmol/L 135 134(L) 134(L)  Potassium 3.5 - 5.1 mmol/L 3.5 3.7 4.2  Chloride 101 - 111 mmol/L 100(L) 100(L) 99(L)  CO2 22 - 32 mmol/L 19(L) 19(L) 21(L)  Calcium 8.9 - 10.3 mg/dL 7.8(L) 7.7(L) 8.5(L)  Total Protein 6.5 - 8.1 g/dL 5.6(L) 5.8(L) 6.2(L)  Total Bilirubin 0.3 - 1.2 mg/dL 2.3(H) 2.5(H) 2.6(H)  Alkaline Phos 38 - 126 U/L 43 45 48  AST 15 - 41 U/L 1252(H) 1127(H) 839(H)  ALT 14 - 54 U/L 1693(H) 1581(H) 1130(H)    CBC Latest Ref Rng 04/25/2015 04/23/2015 04/23/2015  WBC 4.0 - 10.5 K/uL 14.4(H) 10.9(H) -  Hemoglobin 12.0 - 15.0 g/dL 13.1 13.4 16.3(H)  Hematocrit 36.0 - 46.0 % 39.3 41.5 48.0(H)  Platelets 150 - 400 K/uL 237 222 -    Lab Results  Component Value Date   INR 5.90* 04/25/2015   INR 6.30* 04/25/2015   INR 5.40* 04/24/2015    ASSESSMENT: AKI and acute liver failure likely related to acute on chronic systolic CHF in setting of gastroenteritis.  PLAN: F/u renal, liver function F/u C diff PCR, stool studies Heart failure management per cardiology F/u INR  Updated pt's mother at bedside. D/w Dr. Haroldine Laws.  PCCM will sign off.  Please call if additional help needed.  Chesley Mires, MD [facility]  Pulmonary/Critical Care 04/25/2015, 7:58 AM Pager:  [phone] After 3pm call: [phone]

## 2015-04-25 NOTE — Progress Notes (Addendum)
Advanced Heart Failure Rounding Note   Subjective:    Developed shock with MSOF last night due to RV failure. Now on milrinone 0.25 and norepi 5.  Feels better but still weak. Less nausea. 400cc urine out. Creatinine plateauing. LFTs ~1,000  SVC sat 49%. CVP 15  LDH 1200 suggestive of hemolysis off fistula   Objective:   Weight Range:  Vital Signs:   Temp:  [97.3 F (36.3 C)-97.9 F (36.6 C)] 97.9 F (36.6 C) (10/02 0400) Pulse Rate:  [57-110] 108 (10/02 0015) Resp:  [14-32] 16 (10/02 0015) BP: (83-108)/(22-72) 108/54 mmHg (10/02 0015) SpO2:  [98 %-100 %] 99 % (10/02 0015) Weight:  [67 kg (147 lb 11.3 oz)-67.5 kg (148 lb 13 oz)] 67 kg (147 lb 11.3 oz) (10/02 0459) Last BM Date: 04/24/15  Weight change: Filed Weights   04/24/15 0506 04/24/15 2300 04/25/15 0459  Weight: 64.683 kg (142 lb 9.6 oz) 67.5 kg (148 lb 13 oz) 67 kg (147 lb 11.3 oz)    Intake/Output:   Intake/Output Summary (Last 24 hours) at 04/25/15 0819 Last data filed at 04/25/15 0600  Gross per 24 hour  Intake    918 ml  Output    750 ml  Net    168 ml     Physical Exam: General:  Pale fatigued appearin HEENT: normal multiple ecchymosis Neck: supple. JVP ear .RIJ central line Cor: PMI nondisplaced. Tachy regular. +RV lift. Loud P2. Loud SEM at RSB Lungs: clear Abdomen: soft, nontender, minimally distended. No hepatosplenomegaly. No bruits or masses. Good bowel sounds. Extremities: no cyanosis, clubbing, rash, edema. warmer Neuro: alert & orientedx3, cranial nerves grossly intact. moves all 4 extremities w/o difficulty. Affect pleasant  Telemetry: Sinus tach. Brief runs SVT  Labs: Basic Metabolic Panel:  Recent Labs Lab 04/23/15 2222 04/24/15 1016 04/24/15 1846 04/25/15 0008 04/25/15 0440  NA 134* 130* 134* 134* 135  K 4.0 4.1 4.2 3.7 3.5  CL 98* 95* 99* 100* 100*  CO2 22 21* 21* 19* 19*  GLUCOSE 161* 160* 140* 154* 161*  BUN 34* 43* 51* 56* 58*  CREATININE 1.56* 1.86* 2.39*  2.49* 2.50*  CALCIUM 8.8* 8.4* 8.5* 7.7* 7.8*  MG  --  1.5*  --   --   --     Liver Function Tests:  Recent Labs Lab 04/23/15 0830 04/24/15 1846 04/25/15 0008 04/25/15 0440  AST 51* 839* 1127* 1252*  ALT 43 1130* 1581* 1693*  ALKPHOS 63 48 45 43  BILITOT 1.2 2.6* 2.5* 2.3*  PROT 7.0 6.2* 5.8* 5.6*  ALBUMIN 4.0 3.6 3.4* 3.4*    Recent Labs Lab 04/24/15 1846  LIPASE 19*  AMYLASE 25*   No results for input(s): AMMONIA in the last 168 hours.  CBC:  Recent Labs Lab 04/23/15 0830 04/23/15 0841 04/23/15 2222 04/25/15 0008  WBC 9.3  --  10.9* 14.4*  HGB 14.5 16.3* 13.4 13.1  HCT 45.0 48.0* 41.5 39.3  MCV 91.6  --  91.2 91.2  PLT 235  --  222 237    Cardiac Enzymes: No results for input(s): CKTOTAL, CKMB, CKMBINDEX, TROPONINI in the last 168 hours.  BNP: BNP (last 3 results)  Recent Labs  04/23/15 0830  BNP 300.5*    ProBNP (last 3 results)  Recent Labs  07/27/14 0954 02/24/15 1136  PROBNP 41.0 96.0      Other results:  Imaging: Ct Head Wo Contrast  04/24/2015   CLINICAL DATA:  Syncopal episode, fall, headache and vomiting  EXAM:  CT HEAD WITHOUT CONTRAST  TECHNIQUE: Contiguous axial images were obtained from the base of the skull through the vertex without contrast.  COMPARISON:  03/11/2007  FINDINGS: Normal appearance of the intracranial structures. No evidence for acute hemorrhage, mass lesion, midline shift, hydrocephalus or large infarct. No acute bony abnormality. The visualized sinuses are clear.  IMPRESSION: No acute intracranial abnormality.   Electronically Signed   By: Jerilynn Mages.  Shick M.D.   On: 04/24/2015 13:26   Dg Chest Port 1 View  04/25/2015   CLINICAL DATA:  Central line placement. Subsequent encounter. Initial encounter.  EXAM: PORTABLE CHEST 1 VIEW  COMPARISON:  Chest radiograph performed earlier today at 1:11 a.m.  FINDINGS: The patient's right IJ line is noted ending about the distal SVC.  Obscuration of the left hemidiaphragm may reflect  atelectasis. No pleural effusion or pneumothorax is seen.  The cardiomediastinal silhouette is borderline normal in size. The patient is status post median sternotomy. A valve replacement is noted. No acute osseous abnormalities are identified.  IMPRESSION: 1. Right IJ line again noted ending about the distal SVC. 2. Obscuration of the left hemidiaphragm may reflect atelectasis.   Electronically Signed   By: Garald Balding M.D.   On: 04/25/2015 01:43   Dg Chest Port 1 View  04/25/2015   CLINICAL DATA:  Acute onset of central line placement. Initial encounter.  EXAM: PORTABLE CHEST 1 VIEW  COMPARISON:  Chest radiograph performed 08/17/2014  FINDINGS: A right IJ line is noted ending about the distal SVC.  Mild left basilar opacity likely reflects atelectasis. No pleural effusion or pneumothorax is seen.  The cardiomediastinal silhouette is borderline enlarged. The patient is status post median sternotomy. A valve replacement is noted. No acute osseous abnormalities are identified.  IMPRESSION: 1. Right IJ line noted ending about the distal SVC. 2. Mild left basilar airspace opacity likely reflects atelectasis. Borderline cardiomegaly.   Electronically Signed   By: Garald Balding M.D.   On: 04/25/2015 01:34   Dg Abd Portable 1v  04/25/2015   CLINICAL DATA:  Acute onset of vomiting.  Initial encounter.  EXAM: PORTABLE ABDOMEN - 1 VIEW  COMPARISON:  Lumbar spine radiographs performed 03/03/2008  FINDINGS: The visualized bowel gas pattern is unremarkable. Scattered air filled loops of colon are seen; no abnormal dilatation of small bowel loops is seen to suggest small bowel obstruction. No free intra-abdominal air is identified, though evaluation for free air is limited on a single supine view. A small amount of air is noted within the stomach.  The visualized osseous structures are within normal limits; the sacroiliac joints are unremarkable in appearance. The patient is status post median sternotomy. The  visualized lung bases are essentially clear.  IMPRESSION: Unremarkable bowel gas pattern; no free intra-abdominal air seen.   Electronically Signed   By: Garald Balding M.D.   On: 04/25/2015 00:42      Medications:     Scheduled Medications: . sodium chloride   Intravenous Once  . acidophilus  1 capsule Oral Daily  . aspirin EC  81 mg Oral Daily  . atropine      . [START ON 04/26/2015] multivitamin with minerals  1 tablet Oral Once per day on Mon Wed Fri  . ondansetron (ZOFRAN) IV  4 mg Intravenous 4 times per day  . sertraline  50 mg Oral Daily     Infusions: . sodium chloride Stopped (04/24/15 2326)  . milrinone 0.375 mcg/kg/min (04/25/15 0752)  . norepinephrine (LEVOPHED) Adult infusion 5 mcg/min (  04/25/15 0015)     PRN Medications:  acetaminophen, acetaminophen, calcium carbonate, LORazepam, zolpidem   Assessment:   1. Cardiogenic shock 2. Acute RV failure 3. Acute kidney injury 4. Acute liver failure     -Hep A/B negative. Hep C Ab+ (previous blood exposure during transfusion) 5. Complex congential heart disease related to AV canal defect.     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 SVT s/p DC-CV 7. s/p AVR/MVR and TV reconstruction 8. Syncopal episode due to vagal event with head trauma. CT negative for bleed. 9. N/V and diarrhea   Plan/Discussion:     She is beginning to stabilize but still very tenuous. I reviewed echo images with her and her mother. Will likely need surgical repair of recurrent aorta-> RV fistula.   LDH is quite high suggestive of hemolysis. However she is not anemia - possibly suggesting more of an acute process. No clear evidence of device infection. Bcx drawn.   We are temporizing with inotropic support with milrinone and norepi. Will increase milrinone to 0.375. Diurese as tolerated. Recheck labs later today.  Will need inpatient to inpatient transfer likely to [facility] for TEE and definitive repair.  Hopefully she will be stable enough for this tomorrow.  Hold coumadin. Start heparin when INR < 2.5.    The patient is critically ill with multiple organ systems failure and requires high complexity decision making for assessment and support, frequent evaluation and titration of therapies, application of advanced monitoring technologies and extensive interpretation of multiple databases.   Critical Care Time devoted to patient care services described in this note is 60 Minutes.   Length of Stay:  Glori Bickers  MD  04/25/2015, 8:19 AM  Advanced Heart Failure Team Pager [phone] (M-F; 7a - 4p)  Please contact [facility] for night-coverage after hours (4p -7a ) and weekends on [email or web address]

## 2015-04-25 NOTE — Progress Notes (Signed)
eLink Physician-Brief Progress Note Patient Name: Jessica [name] DOB: 13-Sep-1975 MRN: [id number]   Date of Service  04/25/2015  HPI/Events of Note  INR 6.30. No evidence of bleed  eICU Interventions  Coumadin held. Will continue to monitor.     Intervention Category Intermediate Interventions: Coagulopathy - evaluation and management  Kayley Zeiders 04/25/2015, 4:43 AM

## 2015-04-25 NOTE — Progress Notes (Addendum)
  Asked to see patient due to multi-system organ failure.  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 Admitted with N/V/diarrhea. 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   I reviewed echo images with Dr. Harrington Challenger personally and provided care at bedside with her.   On exam pale, cool. Nauseated. Neck veins up. Belly distended.   Plan was to place central line to assess CVP and SVC sat to look for low output. However she decompensated with vagal event and recurrent heart block while vomitting. Empirically started on milrinone and low-dose levophed. BP and nausea improved. IV lasix given.   Central line then placed. Labs drawn. Await results. Discussed with patient and family at bedside.  CCM also at bedside. Labs sent to further evaluate diarrhea/n/v. Including hepatitis panels, c.diff, O&P, stool culture, stool lactoferrin.   Assessment: 1. Cardiogenic shock 2. 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 Acute liver failure 5. Acute renal failure  Total CCT 90 minutes not including time to place central line.   Scottlyn Mchaney,MD 1:20 AM

## 2015-04-26 ENCOUNTER — Inpatient Hospital Stay (HOSPITAL_COMMUNITY): Payer: BLUE CROSS/BLUE SHIELD

## 2015-04-26 DIAGNOSIS — I350 Nonrheumatic aortic (valve) stenosis: Secondary | ICD-10-CM

## 2015-04-26 LAB — PREPARE FRESH FROZEN PLASMA
UNIT DIVISION: 0
UNIT DIVISION: 0

## 2015-04-26 LAB — CARBOXYHEMOGLOBIN
CARBOXYHEMOGLOBIN: 1.5 % (ref 0.5–1.5)
Carboxyhemoglobin: 1.4 % (ref 0.5–1.5)
METHEMOGLOBIN: 1 % (ref 0.0–1.5)
Methemoglobin: 0.9 % (ref 0.0–1.5)
O2 SAT: 49.1 %
O2 Saturation: 46.8 %
TOTAL HEMOGLOBIN: 11.3 g/dL — AB (ref 12.0–16.0)
Total hemoglobin: 11.2 g/dL — ABNORMAL LOW (ref 12.0–16.0)

## 2015-04-26 LAB — PROTIME-INR
INR: 6.62 (ref 0.00–1.49)
Prothrombin Time: 55.5 seconds — ABNORMAL HIGH (ref 11.6–15.2)

## 2015-04-26 LAB — COMPREHENSIVE METABOLIC PANEL
ALBUMIN: 3.4 g/dL — AB (ref 3.5–5.0)
ALK PHOS: 46 U/L (ref 38–126)
ALT: 1412 U/L — AB (ref 14–54)
ANION GAP: 12 (ref 5–15)
AST: 574 U/L — ABNORMAL HIGH (ref 15–41)
BUN: 63 mg/dL — ABNORMAL HIGH (ref 6–20)
CALCIUM: 8.6 mg/dL — AB (ref 8.9–10.3)
CHLORIDE: 98 mmol/L — AB (ref 101–111)
CO2: 23 mmol/L (ref 22–32)
Creatinine, Ser: 2.47 mg/dL — ABNORMAL HIGH (ref 0.44–1.00)
GFR calc non Af Amer: 24 mL/min — ABNORMAL LOW (ref >60)
GFR, EST AFRICAN AMERICAN: 27 mL/min — AB (ref >60)
GLUCOSE: 153 mg/dL — AB (ref 65–99)
Potassium: 3.7 mmol/L (ref 3.5–5.1)
SODIUM: 133 mmol/L — AB (ref 135–145)
Total Bilirubin: 2.3 mg/dL — ABNORMAL HIGH (ref 0.3–1.2)
Total Protein: 5.8 g/dL — ABNORMAL LOW (ref 6.5–8.1)

## 2015-04-26 LAB — BRAIN NATRIURETIC PEPTIDE: B NATRIURETIC PEPTIDE 5: 1062.9 pg/mL — AB (ref 0.0–100.0)

## 2015-04-26 LAB — URINE CULTURE: SPECIAL REQUESTS: NORMAL

## 2015-04-26 LAB — CBC
HCT: 35.6 % — ABNORMAL LOW (ref 36.0–46.0)
HEMOGLOBIN: 11.6 g/dL — AB (ref 12.0–15.0)
MCH: 29.4 pg (ref 26.0–34.0)
MCHC: 32.6 g/dL (ref 30.0–36.0)
MCV: 90.4 fL (ref 78.0–100.0)
Platelets: 221 10*3/uL (ref 150–400)
RBC: 3.94 MIL/uL (ref 3.87–5.11)
RDW: 15.4 % (ref 11.5–15.5)
WBC: 12.7 10*3/uL — ABNORMAL HIGH (ref 4.0–10.5)

## 2015-04-26 MED ORDER — MILRINONE IN DEXTROSE 20 MG/100ML IV SOLN
0.5000 ug/kg/min | INTRAVENOUS | Status: DC
  Administered 2015-04-26: 0.5 ug/kg/min via INTRAVENOUS
  Filled 2015-04-26: qty 100

## 2015-04-26 MED ORDER — LORAZEPAM 2 MG/ML IJ SOLN: 0.5000 mg | Freq: Four times a day (QID) | INTRAMUSCULAR | Status: AC | PRN

## 2015-04-26 MED ORDER — ONDANSETRON HCL 4 MG/2ML IJ SOLN: 4.0000 mg | Freq: Four times a day (QID) | INTRAMUSCULAR | Status: AC

## 2015-04-26 MED ORDER — FUROSEMIDE 10 MG/ML IJ SOLN
40.0000 mg | Freq: Once | INTRAMUSCULAR | Status: AC
  Administered 2015-04-26: 40 mg via INTRAVENOUS

## 2015-04-26 MED ORDER — MILRINONE IN DEXTROSE 20 MG/100ML IV SOLN: 0.5000 ug/kg/min | INTRAVENOUS | Status: AC

## 2015-04-26 MED ORDER — FUROSEMIDE 10 MG/ML IJ SOLN
INTRAMUSCULAR | Status: AC
  Filled 2015-04-26: qty 4

## 2015-04-26 MED ORDER — DEXTROSE 5 % IV SOLN: 10.0000 ug/min | INTRAVENOUS | Status: AC

## 2015-04-26 MED ORDER — SODIUM CHLORIDE 0.9 % IV SOLN
Freq: Once | INTRAVENOUS | Status: AC
  Administered 2015-04-26: 11:00:00 via INTRAVENOUS

## 2015-04-26 MED ORDER — PANTOPRAZOLE SODIUM 40 MG IV SOLR
40.0000 mg | Freq: Two times a day (BID) | INTRAVENOUS | Status: DC
  Administered 2015-04-26: 40 mg via INTRAVENOUS
  Filled 2015-04-26: qty 40

## 2015-04-26 MED ORDER — CALCIUM CARBONATE ANTACID 500 MG PO CHEW: 200.0000 mg | CHEWABLE_TABLET | Freq: Two times a day (BID) | ORAL | Status: AC | PRN

## 2015-04-26 MED ORDER — PANTOPRAZOLE SODIUM 40 MG IV SOLR: 40.0000 mg | Freq: Two times a day (BID) | INTRAVENOUS | Status: AC

## 2015-04-26 NOTE — Discharge Summary (Signed)
Advanced Heart Failure Team  Discharge Summary   Patient ID: Jessica [name] MRN: [id number], DOB/AGE: [birth year] 39 y.o. Admit date: 04/23/2015 D/C date:     04/26/2015   Primary Discharge Diagnoses:   1. Cardiogenic shock 2. Complex congential heart disease related to AV canal defect.   --now with recurrent aortic-RV fistula at site of previous Amplatz closure 3. Acute RV failure 4. Acute kidney injury 5. Acute liver failure  -Hep A/B negative. 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 SVT s/p DC-CV 7. s/p AVR/MVR and TV reconstruction 8. Syncopal episode due to vagal event with head trauma. CT negative for bleed. 9. N/V and diarrhea - felt to be due to cardiogenic shock 10. Hyponatremia   Hospital Course:  Jessica [name] is a 39 y.o. female with a h/o congenital AV canal defect. In May 1991 she underwent modified Konno operation with a 25 mm St Jude valve in the aortic position. In 1995 she had a 23 mm Carbomedics valve placed in the mitral position for a partially obstructed valve. Finally, in 1996 she had patch closue of an aortic-RV fistula. All surgeries were done at [facility] In December 2012 she underwent MV replacement (3rd) with St Jude prosthesis. She also had TV reconstruction, MAZE procedure, isthmus ablation and reconstruction of Jessica [name] tricuspid valve. Also had repair of ascending aortic tear. She had an ablation of atrial flutter in March 2013 by Dr Jarrett Ables. Holter showed atrial ectopy, possible short bursts of atrial tach. In 2015 she went back to [facility] for AVNRT.   She had been doing very well and had just returned from a 2-week trip to [place]. She presented to [facility] ED on  9/30 with increased dyspnea, dizziness, and tachycardia. EKG showed SVT, probable A flutter versus A tach. She underwent DC-CV by Dr. Harrington Challenger with restoration of NSR and wasdischarged home. She returned to the office later that day with nausea and vomiting. EKG showed sinus tach. She was admitted for  hydration and further management. ECHO performed and showed recurrent aorta->RV fistula and dilated and severely hypokinetic RV.   On 10/1 she had syncopal episode in the setting of a vasovagal event due to N/V. She fell and hit Jessica [name] head sustaining multiple facial contusions. Stat head CT obtained with no acute findings. She developed cardiogenic shock with multi-system organ failure with creatinine up to 2.5 and LFTs ~1, 000. Lactic acid ~3.0. INR 5.5. She was transferred to the CCU emergently on the night of 10/1 and the Advanced HF Team was consulted.   Jessica [name] echo was reviewed with Dr. Harrington Challenger and it was felt she would need emergent transfer to [facility] for repair of recurrent aorta--> RV fistula with large left to right shunt.   In the ICU she was started empirically on milrinone and levophed. She also received IV lasix.  A RIJ central line was placed for access and hemodynamic monitoring. Initial SVC sat (on inotropes) was 54% with CVP 15. However the sat then dropped to 46% and Jessica [name] milrinone was titrated up to 0.5 mcg/kg/min. With improvement in Jessica [name] sat to 50%. Jessica [name] creatinine began to improve and Jessica [name] acidosis resolved though she remained quite tenuous. An LDH was checked and was 1,250 suggestive of hemolysis off fistula. She received 2 units FFP on 10/1 and 1 unit FFP on 10/3 (INR 6.2). She has been off coumadin since 04/23/15.   A repeat echo on 10/3 showed persistent normal LV function with persistent RV failure and possible increased shunting around  occluder device.   Dr. Harrington Challenger spoke with Jessica [name] cardiac team at [facility] and arrangements were made for emergent transfer for possible repair of fistula.       Discharge Vitals: Blood pressure 88/48, pulse 106, temperature 98 F (36.7 C), temperature source Oral, resp. rate 31, height 5\' 4"  (1.626 m), weight 145 lb 15.1 oz (66.2 kg), SpO2 96 %.    Physical Exam: CVP 15 General: Pale fatigued appearing HEENT: multiple ecchymosis Neck: supple. JVP ear .RIJ  central line Cor: PMI nondisplaced. Tachy regular. +RV lift. Loud P2. Loud SEM at RSB Lungs: clear Abdomen: soft, nontender, minimally distended. No hepatosplenomegaly. No bruits or masses. Good bowel sounds. Extremities: no cyanosis, clubbing, rash, edema. warmer Neuro: alert & orientedx3, cranial nerves grossly intact. moves all 4 extremities w/o difficulty. Affect pleasant  Telemetry: Sinus tach. Brief runs SVT Labs: Lab Results  Component Value Date   WBC 12.7* 04/26/2015   HGB 11.6* 04/26/2015   HCT 35.6* 04/26/2015   MCV 90.4 04/26/2015   PLT 221 04/26/2015     Recent Labs Lab 04/26/15 0500  NA 133*  K 3.7  CL 98*  CO2 23  BUN 63*  CREATININE 2.47*  CALCIUM 8.6*  PROT 5.8*  BILITOT 2.3*  ALKPHOS 46  ALT 1412*  AST 574*  GLUCOSE 153*   Lab Results  Component Value Date   CHOL 179 02/15/2015   HDL 38.50* 02/15/2015   LDLCALC 110* 02/15/2015   TRIG 152.0* 02/15/2015   BNP (last 3 results)  Recent Labs  04/23/15 0830  BNP 300.5*    ProBNP (last 3 results)  Recent Labs  07/27/14 0954 02/24/15 1136  PROBNP 41.0 96.0     Diagnostic Studies/Procedures   Ct Head Wo Contrast  04/24/2015   CLINICAL DATA:  Syncopal episode, fall, headache and vomiting  EXAM: CT HEAD WITHOUT CONTRAST  TECHNIQUE: Contiguous axial images were obtained from the base of the skull through the vertex without contrast.  COMPARISON:  03/11/2007  FINDINGS: Normal appearance of the intracranial structures. No evidence for acute hemorrhage, mass lesion, midline shift, hydrocephalus or large infarct. No acute bony abnormality. The visualized sinuses are clear.  IMPRESSION: No acute intracranial abnormality.   Electronically Signed   By: Jerilynn Mages.  Shick M.D.   On: 04/24/2015 13:26   Dg Chest Port 1 View  04/25/2015   CLINICAL DATA:  Central line placement. Subsequent encounter. Initial encounter.  EXAM: PORTABLE CHEST 1 VIEW  COMPARISON:  Chest radiograph performed earlier today at 1:11 a.m.   FINDINGS: The patient's right IJ line is noted ending about the distal SVC.  Obscuration of the left hemidiaphragm may reflect atelectasis. No pleural effusion or pneumothorax is seen.  The cardiomediastinal silhouette is borderline normal in size. The patient is status post median sternotomy. A valve replacement is noted. No acute osseous abnormalities are identified.  IMPRESSION: 1. Right IJ line again noted ending about the distal SVC. 2. Obscuration of the left hemidiaphragm may reflect atelectasis.   Electronically Signed   By: Garald Balding M.D.   On: 04/25/2015 01:43   Dg Chest Port 1 View  04/25/2015   CLINICAL DATA:  Acute onset of central line placement. Initial encounter.  EXAM: PORTABLE CHEST 1 VIEW  COMPARISON:  Chest radiograph performed 08/17/2014  FINDINGS: A right IJ line is noted ending about the distal SVC.  Mild left basilar opacity likely reflects atelectasis. No pleural effusion or pneumothorax is seen.  The cardiomediastinal silhouette is borderline enlarged. The patient is status  post median sternotomy. A valve replacement is noted. No acute osseous abnormalities are identified.  IMPRESSION: 1. Right IJ line noted ending about the distal SVC. 2. Mild left basilar airspace opacity likely reflects atelectasis. Borderline cardiomegaly.   Electronically Signed   By: Garald Balding M.D.   On: 04/25/2015 01:34   Dg Abd Portable 1v  04/25/2015   CLINICAL DATA:  Acute onset of vomiting.  Initial encounter.  EXAM: PORTABLE ABDOMEN - 1 VIEW  COMPARISON:  Lumbar spine radiographs performed 03/03/2008  FINDINGS: The visualized bowel gas pattern is unremarkable. Scattered air filled loops of colon are seen; no abnormal dilatation of small bowel loops is seen to suggest small bowel obstruction. No free intra-abdominal air is identified, though evaluation for free air is limited on a single supine view. A small amount of air is noted within the stomach.  The visualized osseous structures are within  normal limits; the sacroiliac joints are unremarkable in appearance. The patient is status post median sternotomy. The visualized lung bases are essentially clear.  IMPRESSION: Unremarkable bowel gas pattern; no free intra-abdominal air seen.   Electronically Signed   By: Garald Balding M.D.   On: 04/25/2015 00:42    Discharge Medications     Medication List    STOP taking these medications        acetaminophen 500 MG tablet  Commonly known as:  TYLENOL     aspirin EC 81 MG tablet     furosemide 40 MG tablet  Commonly known as:  LASIX     hydrochlorothiazide 50 MG tablet  Commonly known as:  HYDRODIURIL     potassium chloride 10 MEQ tablet  Commonly known as:  K-DUR     spironolactone 50 MG tablet  Commonly known as:  ALDACTONE     warfarin 4 MG tablet  Commonly known as:  COUMADIN     zaleplon 10 MG capsule  Commonly known as:  SONATA      TAKE these medications        calcium carbonate 500 MG chewable tablet  Commonly known as:  TUMS - dosed in mg elemental calcium  Chew 1 tablet (200 mg of elemental calcium total) by mouth 2 (two) times daily as needed for indigestion or heartburn.     LORazepam 2 MG/ML injection  Commonly known as:  ATIVAN  Inject 0.25 mLs (0.5 mg total) into the vein every 6 (six) hours as needed for anxiety.     milrinone 20 MG/100ML Soln infusion  Commonly known as:  PRIMACOR  Inject 33.1 mcg/min into the vein continuous.     multivitamin with minerals Tabs tablet  Take 1 tablet by mouth 3 (three) times a week.     norepinephrine 16 mg in dextrose 5 % 250 mL  Inject 10 mcg/min into the vein continuous.     norethindrone-ethinyl estradiol 1/35 tablet  Commonly known as:  NECON 1/35 (28)  Take 1 tablet by mouth daily. Continuous use.     ondansetron 4 MG/2ML Soln injection  Commonly known as:  ZOFRAN  Inject 2 mLs (4 mg total) into the vein every 6 (six) hours.     pantoprazole 40 MG injection  Commonly known as:  PROTONIX  Inject  40 mg into the vein every 12 (twelve) hours.     sertraline 50 MG tablet  Commonly known as:  ZOLOFT  Take 50 mg by mouth daily.        Disposition   The patient will be  discharged in stable condition to home. Discharge Instructions    Contraindication to ACEI at discharge    Complete by:  As directed      Diet - low sodium heart healthy    Complete by:  As directed      Increase activity slowly    Complete by:  As directed               Duration of Discharge Encounter: Greater than 35 minutes   Signed, CLEGG,AMY NP-C  04/26/2015, 10:51 AM   Patient seen and examined with Darrick Grinder, NP. We discussed all aspects of the encounter. I agree with the assessment and plan as stated above.   I have edited D/C summary with my findings.   Bensimhon, Daniel,MD 2:33 PM

## 2015-04-26 NOTE — Progress Notes (Signed)
Dr bensimhon states pt transf to [facility]. Spoke w transf coord [phone] and she will ck on phy there and bed. They will fly pt there. Transfer coord to start making arrangemnts. Will call unit when has things lined up.

## 2015-04-26 NOTE — Progress Notes (Addendum)
Patient transported to [facility] ([facility] by their Life Flight Team @ 1800.  Patient was awake, alert and oriented at time of transport.  Telemetry monitor disconnected and eLink notified of transfer.  Family aware and in route to [facility].  Discharge information and COBRA form sent with [facility] staff (John,R.N.).  Report called to [place] at [place].

## 2015-04-27 ENCOUNTER — Ambulatory Visit: Payer: BLUE CROSS/BLUE SHIELD | Admitting: Sports Medicine

## 2015-04-27 LAB — OVA AND PARASITE EXAMINATION

## 2015-04-27 LAB — FECAL LACTOFERRIN, QUANT: Fecal Lactoferrin: POSITIVE

## 2015-04-27 LAB — PREPARE FRESH FROZEN PLASMA: UNIT DIVISION: 0

## 2015-04-28 ENCOUNTER — Other Ambulatory Visit: Payer: Self-pay | Admitting: Internal Medicine

## 2015-04-28 DIAGNOSIS — I059 Rheumatic mitral valve disease, unspecified: Secondary | ICD-10-CM

## 2015-04-29 ENCOUNTER — Ambulatory Visit: Payer: BLUE CROSS/BLUE SHIELD

## 2015-04-29 ENCOUNTER — Other Ambulatory Visit (HOSPITAL_COMMUNITY): Payer: BLUE CROSS/BLUE SHIELD

## 2015-04-30 LAB — CULTURE, BLOOD (ROUTINE X 2)
CULTURE: NO GROWTH
Culture: NO GROWTH

## 2015-05-04 NOTE — Addendum Note (Signed)
Addendum  created 05/04/15 0847 by Albertha Ghee, MD   Modules edited: Anesthesia Events, Narrator   Narrator:  Narrator: Event Log Edited

## 2015-05-24 ENCOUNTER — Ambulatory Visit: Payer: Self-pay | Admitting: General Practice

## 2015-05-24 DIAGNOSIS — Z952 Presence of prosthetic heart valve: Secondary | ICD-10-CM

## 2015-05-24 DIAGNOSIS — D6859 Other primary thrombophilia: Secondary | ICD-10-CM

## 2015-05-24 DIAGNOSIS — Z5181 Encounter for therapeutic drug level monitoring: Secondary | ICD-10-CM

## 2015-05-25 DEATH — deceased

## 2015-12-02 IMAGING — CR DG FOOT COMPLETE 3+V*R*
3 series · 3 of 3 positions shown · non-contrast
Comparison: None.

CLINICAL DATA: Lateral right foot pain for the past 3 days. No
known injury.

EXAM:
RIGHT FOOT COMPLETE - 3+ VIEW

[oblique]
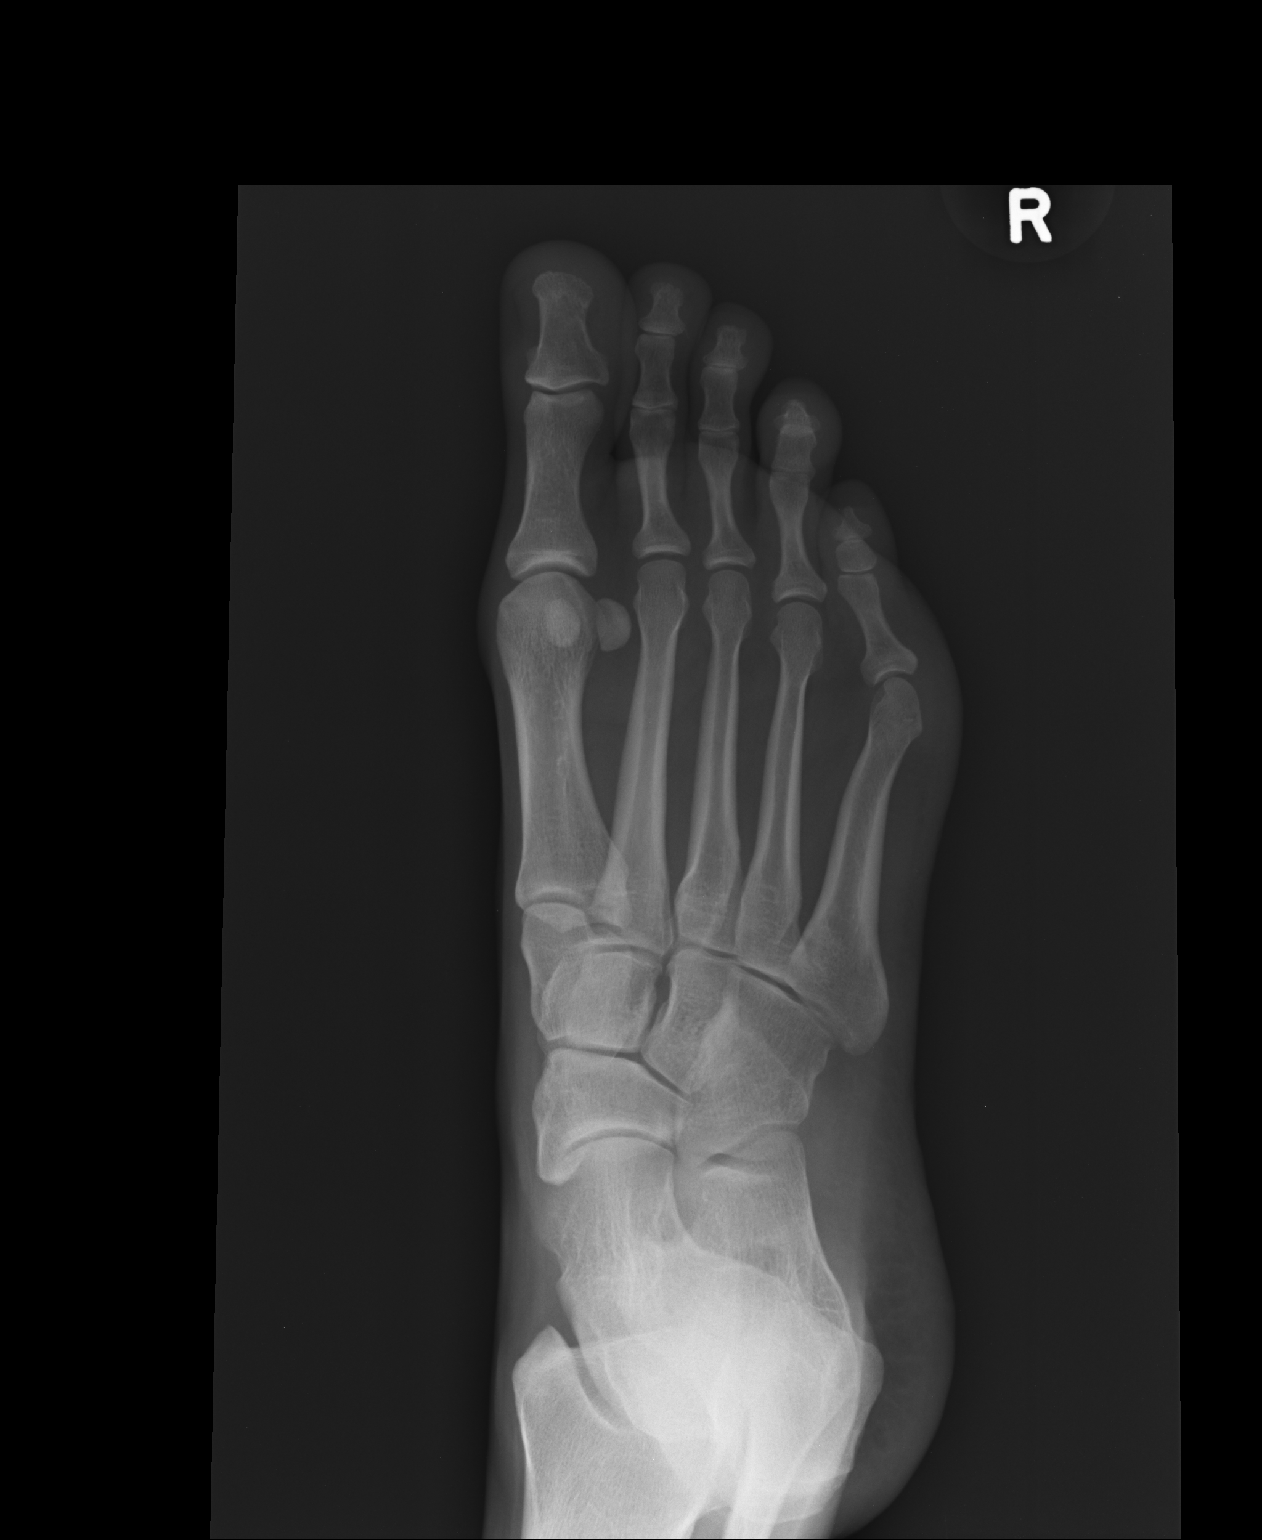

[lateral]
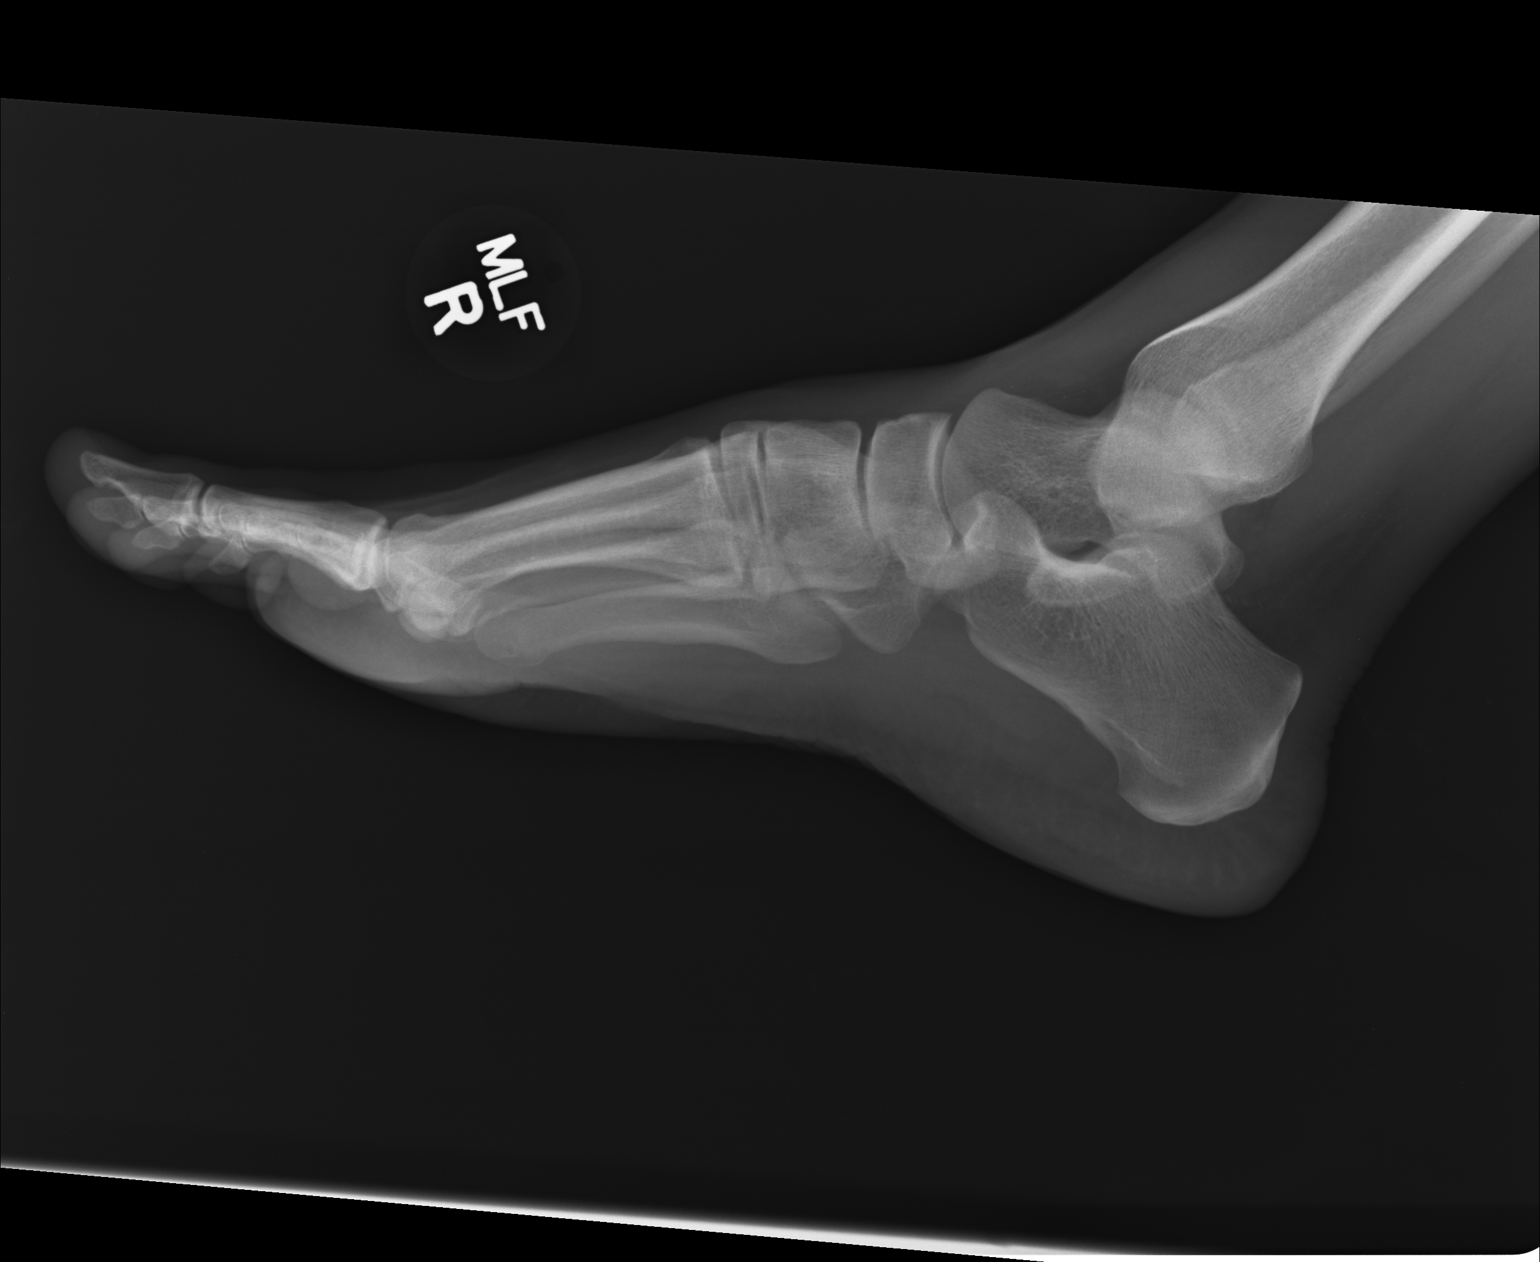

[AP]
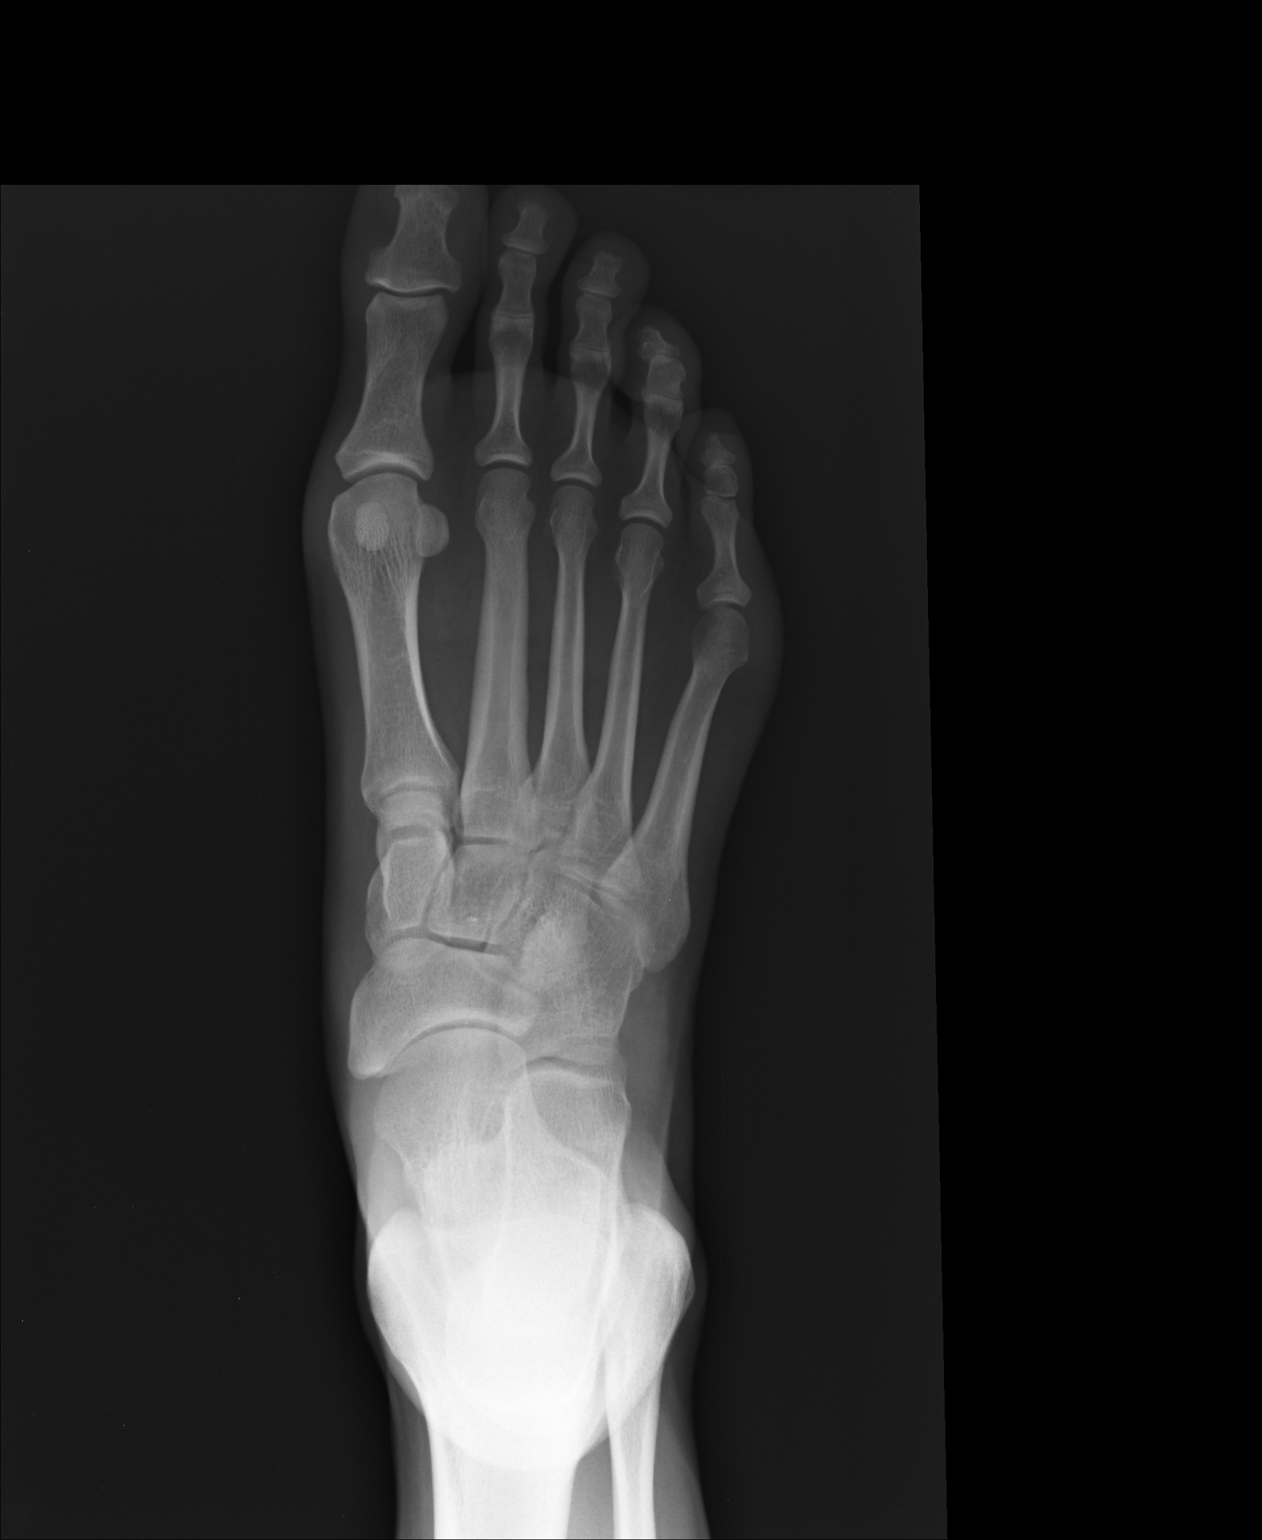

[3 of 3 positions shown; findings below may reference images not displayed]

FINDINGS: There is no evidence of fracture or dislocation. There is no
evidence of arthropathy or other focal bone abnormality. Soft
tissues are unremarkable.
IMPRESSION: Normal examination.
